# Patient Record
Sex: Male | Born: 1950
Health system: Southern US, Community
[De-identification: ages and names within clinical notes are randomized; demographics above are authoritative.]

## PROBLEM LIST (undated history)

## (undated) DIAGNOSIS — I4891 Unspecified atrial fibrillation: Secondary | ICD-10-CM

## (undated) DIAGNOSIS — I493 Ventricular premature depolarization: Secondary | ICD-10-CM

## (undated) DIAGNOSIS — E042 Nontoxic multinodular goiter: Secondary | ICD-10-CM

## (undated) DIAGNOSIS — M75100 Unspecified rotator cuff tear or rupture of unspecified shoulder, not specified as traumatic: Secondary | ICD-10-CM

## (undated) DIAGNOSIS — U071 COVID-19: Secondary | ICD-10-CM

## (undated) DIAGNOSIS — M1990 Unspecified osteoarthritis, unspecified site: Secondary | ICD-10-CM

## (undated) DIAGNOSIS — F40240 Claustrophobia: Secondary | ICD-10-CM

## (undated) DIAGNOSIS — R002 Palpitations: Secondary | ICD-10-CM

## (undated) DIAGNOSIS — I499 Cardiac arrhythmia, unspecified: Secondary | ICD-10-CM

## (undated) DIAGNOSIS — M7521 Bicipital tendinitis, right shoulder: Secondary | ICD-10-CM

## (undated) HISTORY — DX: Claustrophobia: F40.240

## (undated) HISTORY — PX: SHOULDER SURGERY: SHX246

## (undated) HISTORY — DX: Unspecified rotator cuff tear or rupture of unspecified shoulder, not specified as traumatic: M75.100

## (undated) HISTORY — DX: Nontoxic multinodular goiter: E04.2

## (undated) HISTORY — DX: Bicipital tendinitis, right shoulder: M75.21

## (undated) HISTORY — PX: TONSILLECTOMY: SUR1361

## (undated) HISTORY — PX: OTHER SURGICAL HISTORY: SHX169

## (undated) HISTORY — DX: Unspecified atrial fibrillation: I48.91

## (undated) HISTORY — DX: Ventricular premature depolarization: I49.3

## (undated) HISTORY — PX: KNEE SURGERY: SHX244

## (undated) HISTORY — DX: COVID-19: U07.1

## (undated) HISTORY — DX: Palpitations: R00.2

---

## 2002-08-28 HISTORY — PX: MOLE REMOVAL: SHX2046

## 2013-09-17 ENCOUNTER — Encounter: Payer: Self-pay | Admitting: General Surgery

## 2013-10-06 ENCOUNTER — Ambulatory Visit (INDEPENDENT_AMBULATORY_CARE_PROVIDER_SITE_OTHER): Payer: BC Managed Care – PPO | Admitting: General Surgery

## 2013-10-06 ENCOUNTER — Encounter: Payer: Self-pay | Admitting: General Surgery

## 2013-10-06 VITALS — BP 130/78 | HR 74 | Resp 12 | Ht 67.0 in | Wt 189.0 lb

## 2013-10-06 DIAGNOSIS — R229 Localized swelling, mass and lump, unspecified: Secondary | ICD-10-CM

## 2013-10-06 DIAGNOSIS — Z85820 Personal history of malignant melanoma of skin: Secondary | ICD-10-CM

## 2013-10-06 DIAGNOSIS — C439 Malignant melanoma of skin, unspecified: Secondary | ICD-10-CM

## 2013-10-06 DIAGNOSIS — D485 Neoplasm of uncertain behavior of skin: Secondary | ICD-10-CM

## 2013-10-06 NOTE — Patient Instructions (Signed)
Return one week nurse

## 2013-10-06 NOTE — Progress Notes (Signed)
Patient ID: James Carrillo, male   DOB: [birth year], 63 y.o.   MRN: [id number]  Chief Complaint  Patient presents with  . Other    mole below breast bone    HPI Mohid Furuya is a 63 y.o. male here today for an evaluation of mole above breast bone. Patient states that he noticed this 5 years. He states in the last 6 months it has gotten bigger and now is changing color. The area is not pain unless touched.  HPI  History reviewed. No pertinent past medical history.  Past Surgical History  Procedure Laterality Date  . Shoulder surgery Right 1999  . Knee surgery Left   . Mole removal  2004    Dr. Collene Schlichter    Family History  Problem Relation Age of Onset  . Melanoma Father     hand and arms    Social History History  Substance Use Topics  . Smoking status: Never Smoker   . Smokeless tobacco: Never Used  . Alcohol Use: No    No Known Allergies  No current outpatient prescriptions on file.   No current facility-administered medications for this visit.    Review of Systems Review of Systems  Constitutional: Negative.   Respiratory: Negative.   Cardiovascular: Negative.     Blood pressure 130/78, pulse 74, resp. rate 12, height 5\' 7"  (1.702 m), weight 189 lb (85.73 kg).  Physical Exam Physical Exam  Constitutional: He is oriented to person, place, and time. He appears well-developed and well-nourished.  Eyes: Conjunctivae are normal.  Neck: Neck supple.  Cardiovascular: Normal rate, regular rhythm and normal heart sounds.   Pulmonary/Chest:    6 mm raised nodular in the midline upper stereum.  Neurological: He is alert and oriented to person, place, and time.  Skin: Skin is warm and dry.       Assessment    Nodular skin lesion, possible basal cell carcinoma.     Plan    Excision was recommended and accepted. 10 cc of 0.5% Xylocaine with 0.25% Marcaine with 1-200,000 epinephrine was utilized well-tolerated. Skin preparation with ChloraPrep was applied.  The area was excised elliptically incision and a suture placed on the 12:00 position of the transversely oriented the specimen. The wound was then closed with interrupted 4-0 Vicryl subcutaneous 2 sutures. Benzoin and Steri-Strips followed by Telfa Tegaderm dressing applied. The procedure was well-tolerated. The patient will return in one week for wound examination with the staff. He will be contacted with the pathology is available.        Robert Bellow 10/06/2013, 9:04 PM

## 2013-10-10 ENCOUNTER — Telehealth: Payer: Self-pay | Admitting: General Surgery

## 2013-10-10 LAB — PATHOLOGY

## 2013-10-10 NOTE — Telephone Encounter (Signed)
Patient notified the mole removed from the chest was benign. He reports he is doing well. F/U as previously scheduled.

## 2013-10-13 ENCOUNTER — Ambulatory Visit: Payer: BC Managed Care – PPO

## 2013-10-22 ENCOUNTER — Encounter: Payer: Self-pay | Admitting: General Surgery

## 2013-11-04 ENCOUNTER — Telehealth: Payer: Self-pay | Admitting: *Deleted

## 2013-11-04 NOTE — Telephone Encounter (Signed)
t states he is healed. Area clean. Aware to call if new issues arise. He does not need to rescheduled missed nurse appointment.

## 2013-11-04 NOTE — Telephone Encounter (Signed)
No appt needed

## 2015-09-02 DIAGNOSIS — M6740 Ganglion, unspecified site: Secondary | ICD-10-CM | POA: Insufficient documentation

## 2016-11-07 ENCOUNTER — Other Ambulatory Visit: Payer: Self-pay | Admitting: Orthopedic Surgery

## 2016-11-07 DIAGNOSIS — M25511 Pain in right shoulder: Secondary | ICD-10-CM

## 2016-11-21 ENCOUNTER — Ambulatory Visit
Admission: RE | Admit: 2016-11-21 | Discharge: 2016-11-21 | Disposition: A | Discharge status: Home / Self Care | Payer: Medicare HMO | Source: Ambulatory Visit | Attending: Orthopedic Surgery | Admitting: Orthopedic Surgery

## 2016-11-21 DIAGNOSIS — M25511 Pain in right shoulder: Secondary | ICD-10-CM

## 2016-12-03 ENCOUNTER — Other Ambulatory Visit: Payer: Medicare HMO

## 2016-12-15 ENCOUNTER — Ambulatory Visit
Admission: RE | Admit: 2016-12-15 | Discharge: 2016-12-15 | Disposition: A | Discharge status: Home / Self Care | Payer: Medicare HMO | Source: Ambulatory Visit | Attending: Orthopedic Surgery | Admitting: Orthopedic Surgery

## 2017-07-31 ENCOUNTER — Ambulatory Visit: Payer: Medicare HMO | Admitting: Internal Medicine

## 2017-08-10 ENCOUNTER — Encounter: Payer: Self-pay | Admitting: Internal Medicine

## 2017-08-10 ENCOUNTER — Ambulatory Visit (INDEPENDENT_AMBULATORY_CARE_PROVIDER_SITE_OTHER): Payer: Medicare HMO | Admitting: Internal Medicine

## 2017-08-10 VITALS — BP 108/76 | HR 79 | Temp 98.0°F | Ht 68.0 in | Wt 185.4 lb

## 2017-08-10 DIAGNOSIS — E785 Hyperlipidemia, unspecified: Secondary | ICD-10-CM | POA: Diagnosis not present

## 2017-08-10 DIAGNOSIS — L049 Acute lymphadenitis, unspecified: Secondary | ICD-10-CM

## 2017-08-10 DIAGNOSIS — Z23 Encounter for immunization: Secondary | ICD-10-CM

## 2017-08-10 DIAGNOSIS — F419 Anxiety disorder, unspecified: Secondary | ICD-10-CM

## 2017-08-10 DIAGNOSIS — R591 Generalized enlarged lymph nodes: Secondary | ICD-10-CM

## 2017-08-10 MED ORDER — ALPRAZOLAM 0.5 MG PO TABS: 0.5000 mg | ORAL_TABLET | ORAL | 0 refills | Status: DC | PRN

## 2017-08-10 MED ORDER — DTAP-IPV VACCINE IM SUSP: 0.5000 mL | Freq: Once | INTRAMUSCULAR | 0 refills | Status: AC

## 2017-08-10 MED ORDER — ZOSTER VAC RECOMB ADJUVANTED 50 MCG/0.5ML IM SUSR: 0.5000 mL | Freq: Once | INTRAMUSCULAR | 0 refills | Status: AC

## 2017-08-10 NOTE — Patient Instructions (Signed)
I will let you know about he CT scan results  [place] f/u in 4-6 months sooner if needed.   Lymphadenopathy Lymphadenopathy refers to swollen or enlarged lymph glands, also called lymph nodes. Lymph glands are part of your body's defense (immune) system, which protects the body from infections, germs, and diseases. Lymph glands are found in many locations in your body, including the neck, underarm, and groin. Many things can cause lymph glands to become enlarged. When your immune system responds to germs, such as viruses or bacteria, infection-fighting cells and fluid build up. This causes the glands to grow in size. Usually, this is not something to worry about. The swelling and any soreness often go away without treatment. However, swollen lymph glands can also be caused by a number of diseases. Your health care provider may do various tests to help determine the cause. If the cause of your swollen lymph glands cannot be found, it is important to monitor your condition to make sure the swelling goes away. Follow these instructions at home: Watch your condition for any changes. The following actions may help to lessen any discomfort you are feeling:  Get plenty of rest.  Take medicines only as directed by your health care provider. Your health care provider may recommend over-the-counter medicines for pain.  Apply moist heat compresses to the site of swollen lymph nodes as directed by your health care provider. This can help reduce any pain.  Check your lymph nodes daily for any changes.  Keep all follow-up visits as directed by your health care provider. This is important.  Contact a health care provider if:  Your lymph nodes are still swollen after 2 weeks.  Your swelling increases or spreads to other areas.  Your lymph nodes are hard, seem fixed to the skin, or are growing rapidly.  Your skin over the lymph nodes is red and inflamed.  You have a fever.  You have  chills.  You have fatigue.  You develop a sore throat.  You have abdominal pain.  You have weight loss.  You have night sweats. Get help right away if:  You notice fluid leaking from the area of the enlarged lymph node.  You have severe pain in any area of your body.  You have chest pain.  You have shortness of breath. This information is not intended to replace advice given to you by your health care provider. Make sure you discuss any questions you have with your health care provider. Document Released: 05/23/2008 Document Revised: 01/20/2016 Document Reviewed: 03/19/2014 Elsevier Interactive Patient Education  [employer].

## 2017-08-10 NOTE — Progress Notes (Addendum)
Chief Complaint  Patient presents with  . Establish Care   Dr. Kelly Services patient from [facility]  1. He is c/w enlarged lymph nodes in his neck and it was eval 2 years ago via [place] at [facility] He had a dream that was concerning to him and wants to get checked out. He is feeling well overall and still active with exercise. He does get claustrophobic with CT/MRI scans and requests Xanax today  2. HLD labs checked w/in last 6 months will get from [facility] declines to be on statin    Review of Systems  Constitutional: Negative for weight loss.  HENT: Negative for hearing loss.   Respiratory: Negative for shortness of breath.   Cardiovascular: Negative for chest pain.  Gastrointestinal: Negative for abdominal pain and blood in stool.  Genitourinary:       H/o urine retention since 20s r/o BPH per pt   Musculoskeletal: Positive for joint pain.       +right shoulder pain intermittently   Skin: Negative for rash.  Neurological: Negative for headaches.  Endo/Heme/Allergies:       C/w lymph nodes neck   Psychiatric/Behavioral: Negative for memory loss.   Past Medical History:  Diagnosis Date  . Claustrophobia    Past Surgical History:  Procedure Laterality Date  . KNEE SURGERY Left   . MOLE REMOVAL  2004   Dr. Collene Schlichter  . SHOULDER SURGERY Right 1999   Family History  Problem Relation Age of Onset  . Melanoma Father        hand and arms   Social History   Socioeconomic History  . Marital status: Single    Spouse name: Not on file  . Number of children: Not on file  . Years of education: Not on file  . Highest education level: Not on file  Social Needs  . Financial resource strain: Not on file  . Food insecurity - worry: Not on file  . Food insecurity - inability: Not on file  . Transportation needs - medical: Not on file  . Transportation needs - non-medical: Not on file  Occupational History  . Not on file  Tobacco Use  . Smoking status: Never Smoker  . Smokeless tobacco: Never  Used  Substance and Sexual Activity  . Alcohol use: No  . Drug use: No  . Sexual activity: Not on file  Other Topics Concern  . Not on file  Social History Narrative  . Not on file   Current Meds  Medication Sig  . aspirin EC 81 MG tablet Take 81 tablets by mouth daily.  Marland Kitchen Lysine 500 MG TABS Take 1 tablet by mouth daily.  . Multiple Vitamins-Minerals (MULTIVITAMIN ADULT PO) Take 1 tablet by mouth daily.  . Omega-3 Fatty Acids (FISH OIL) 1200 MG CAPS Take 1 capsule by mouth 2 (two) times daily.  . Red Yeast Rice 600 MG CAPS Take 1 capsule by mouth 2 (two) times daily.   No Known Allergies No results found for this or any previous visit (from the past 2160 hour(s)). Objective  Body mass index is 28.19 kg/m. Wt Readings from Last 3 Encounters:  08/10/17 185 lb 6 oz (84.1 kg)  10/06/13 189 lb (85.7 kg)   Temp Readings from Last 3 Encounters:  08/10/17 98 F (36.7 C) (Oral)   BP Readings from Last 3 Encounters:  08/10/17 108/76  10/06/13 130/78   Pulse Readings from Last 3 Encounters:  08/10/17 79  10/06/13 74   O2 room air 94%  Physical Exam  Constitutional: He is oriented to person, place, and time and well-developed, well-nourished, and in no distress. Vital signs are normal.  HENT:  Head: Normocephalic and atraumatic.    Mouth/Throat: Oropharynx is clear and moist and mucous membranes are normal.  Eyes: Conjunctivae are normal. Pupils are equal, round, and reactive to light.  Cardiovascular: Normal rate, regular rhythm and normal heart sounds.  Pulmonary/Chest: Effort normal and breath sounds normal.  Abdominal: Soft. Bowel sounds are normal. There is no tenderness.  Lymphadenopathy:       Head (right side): No submental, no submandibular, no tonsillar, no preauricular, no posterior auricular and no occipital adenopathy present.       Head (left side): Submandibular adenopathy present. No submental, no tonsillar, no preauricular, no posterior auricular and no  occipital adenopathy present.    He has cervical adenopathy.       Right: No supraclavicular adenopathy present.       Left: No supraclavicular adenopathy present.  Neurological: He is alert and oriented to person, place, and time. He has normal motor skills. Gait normal. Gait normal.  Skin: Skin is warm, dry and intact.  Psychiatric: Mood, memory, affect and judgment normal.  Nursing note and vitals reviewed.   Assessment   1. Lymphadenopathy left submandibular with h/o lymphadenopathy and h/o thyroid cyst thyroid US done 07/09/15 possibly at [facility]  2. HLD pt declines statin  Lipid 04/2016 TC 198, TG 185, HDL 40, LDL 121  Lipid 11/10/16 TC 201, TG 153, HDL 43, LDL 126  3. HM  Plan  1. Do CT neck with contrast Xanax 0.5 prn #3 no RF for procedure Obtain US neck [facility] 2 years ago  2. On red yeast rice and fish oil Lifestyle changes, healthy  Declines statin in the past  3. Had flu shot 2018 maybe [facility]  --call to confirm vaccines  -check on pneumonia vaccines as well  -prevnar had 06/04/15, pna 23 had 06/02/13 (had at [place] ct drug) ,shingles had 05/25/14  cologuard neg 08/06/15 done by Dr. Kelly Services at [place] declined coloscopy in the past.   Tdap, shingrix, hep B vaccine given Rx today to get at pharmacy  Declines colonoscopy did cologuard previously   Never smoker  Need to obtain [facility] records. Obtained 08/15/17 reviewed  Hep C neg 04/2016  PSA 11/16/16 1.1  TSH 03/14/17 1.340 rec hep B vaccine 03/14/17 titer low <10. sAg and core total neg  Follows with [place] derm referred 10/2016  At one pt on Valtrex h/o HSV but has not needed recently   [facility] [place].   Provider: Dr. Olivia Mackie McLean-Scocuzza-Internal Medicine

## 2017-09-06 ENCOUNTER — Ambulatory Visit: Payer: Medicare HMO

## 2017-09-07 ENCOUNTER — Telehealth: Payer: Self-pay

## 2017-09-07 NOTE — Telephone Encounter (Signed)
Copied from [place]. Topic: Inquiry >> Sep 07, 2017 11:31 AM [name] wrote: Reason for CRM: pt requests instead of the ct scan that was ordered pt wants an ultrasound/mri, contact pt to advise

## 2017-09-18 DIAGNOSIS — S46019A Strain of muscle(s) and tendon(s) of the rotator cuff of unspecified shoulder, initial encounter: Secondary | ICD-10-CM | POA: Insufficient documentation

## 2017-10-12 ENCOUNTER — Other Ambulatory Visit: Payer: Self-pay | Admitting: Internal Medicine

## 2017-10-12 DIAGNOSIS — R591 Generalized enlarged lymph nodes: Secondary | ICD-10-CM

## 2017-10-12 DIAGNOSIS — E041 Nontoxic single thyroid nodule: Secondary | ICD-10-CM

## 2017-10-12 NOTE — Telephone Encounter (Signed)
Ask ultrasound scheduling if needs 2 separate orders to look at thyroid and neck for lymph nodes please  Please call pt to schedule US neck  And inform pt      Thanks [place]

## 2017-10-12 NOTE — Telephone Encounter (Signed)
Patient calling back stating that he has sent a message to cancell CT but wanted a U/S instead he had called and left a message on 09-07-17 and still haven't heard from anyone

## 2017-10-12 NOTE — Telephone Encounter (Signed)
Please advise 

## 2017-10-19 ENCOUNTER — Ambulatory Visit
Admission: RE | Admit: 2017-10-19 | Discharge: 2017-10-19 | Disposition: A | Discharge status: Home / Self Care | Payer: Medicare HMO | Source: Ambulatory Visit | Attending: Internal Medicine | Admitting: Internal Medicine

## 2017-10-19 DIAGNOSIS — R591 Generalized enlarged lymph nodes: Secondary | ICD-10-CM | POA: Diagnosis not present

## 2017-10-19 DIAGNOSIS — E041 Nontoxic single thyroid nodule: Secondary | ICD-10-CM | POA: Diagnosis present

## 2017-10-19 DIAGNOSIS — E042 Nontoxic multinodular goiter: Secondary | ICD-10-CM | POA: Insufficient documentation

## 2017-10-31 ENCOUNTER — Encounter: Payer: Self-pay | Admitting: Internal Medicine

## 2018-01-18 ENCOUNTER — Ambulatory Visit: Payer: Medicare HMO

## 2018-02-05 ENCOUNTER — Ambulatory Visit: Payer: Self-pay | Admitting: Sports Medicine

## 2018-02-08 ENCOUNTER — Ambulatory Visit (INDEPENDENT_AMBULATORY_CARE_PROVIDER_SITE_OTHER): Payer: Medicare HMO | Admitting: Internal Medicine

## 2018-02-08 ENCOUNTER — Encounter: Payer: Self-pay | Admitting: Internal Medicine

## 2018-02-08 VITALS — BP 128/90 | HR 66 | Temp 97.5°F | Ht 67.5 in | Wt 179.2 lb

## 2018-02-08 DIAGNOSIS — Z1389 Encounter for screening for other disorder: Secondary | ICD-10-CM

## 2018-02-08 DIAGNOSIS — E041 Nontoxic single thyroid nodule: Secondary | ICD-10-CM | POA: Diagnosis not present

## 2018-02-08 DIAGNOSIS — Z1329 Encounter for screening for other suspected endocrine disorder: Secondary | ICD-10-CM

## 2018-02-08 DIAGNOSIS — R269 Unspecified abnormalities of gait and mobility: Secondary | ICD-10-CM

## 2018-02-08 DIAGNOSIS — E785 Hyperlipidemia, unspecified: Secondary | ICD-10-CM | POA: Diagnosis not present

## 2018-02-08 DIAGNOSIS — R002 Palpitations: Secondary | ICD-10-CM | POA: Diagnosis not present

## 2018-02-08 DIAGNOSIS — M25512 Pain in left shoulder: Secondary | ICD-10-CM

## 2018-02-08 DIAGNOSIS — Z13220 Encounter for screening for lipoid disorders: Secondary | ICD-10-CM | POA: Diagnosis not present

## 2018-02-08 DIAGNOSIS — G8929 Other chronic pain: Secondary | ICD-10-CM | POA: Diagnosis not present

## 2018-02-08 DIAGNOSIS — Z125 Encounter for screening for malignant neoplasm of prostate: Secondary | ICD-10-CM | POA: Diagnosis not present

## 2018-02-08 DIAGNOSIS — E559 Vitamin D deficiency, unspecified: Secondary | ICD-10-CM | POA: Diagnosis not present

## 2018-02-08 DIAGNOSIS — Z0000 Encounter for general adult medical examination without abnormal findings: Secondary | ICD-10-CM | POA: Diagnosis not present

## 2018-02-08 LAB — LIPID PANEL
CHOLESTEROL: 220 mg/dL — AB (ref 0–200)
HDL: 42 mg/dL (ref >39.00)
NonHDL: 178.23
Total CHOL/HDL Ratio: 5
Triglycerides: 218 mg/dL — ABNORMAL HIGH (ref 0.0–149.0)
VLDL: 43.6 mg/dL — AB (ref 0.0–40.0)

## 2018-02-08 LAB — COMPREHENSIVE METABOLIC PANEL
ALK PHOS: 64 U/L (ref 39–117)
ALT: 26 U/L (ref 0–53)
AST: 23 U/L (ref 0–37)
Albumin: 4.7 g/dL (ref 3.5–5.2)
BILIRUBIN TOTAL: 0.5 mg/dL (ref 0.2–1.2)
BUN: 17 mg/dL (ref 6–23)
CO2: 26 meq/L (ref 19–32)
Calcium: 9.6 mg/dL (ref 8.4–10.5)
Chloride: 102 mEq/L (ref 96–112)
Creatinine, Ser: 0.94 mg/dL (ref 0.40–1.50)
GFR: 84.55 mL/min (ref >60.00)
GLUCOSE: 98 mg/dL (ref 70–99)
Potassium: 4.4 mEq/L (ref 3.5–5.1)
SODIUM: 139 meq/L (ref 135–145)
Total Protein: 7 g/dL (ref 6.0–8.3)

## 2018-02-08 LAB — TSH: TSH: 1.66 u[IU]/mL (ref 0.35–4.50)

## 2018-02-08 LAB — CBC WITH DIFFERENTIAL/PLATELET
Basophils Absolute: 0 10*3/uL (ref 0.0–0.1)
Basophils Relative: 0.7 % (ref 0.0–3.0)
Eosinophils Absolute: 0.2 10*3/uL (ref 0.0–0.7)
Eosinophils Relative: 2.4 % (ref 0.0–5.0)
HCT: 49.3 % (ref 39.0–52.0)
Hemoglobin: 17.1 g/dL — ABNORMAL HIGH (ref 13.0–17.0)
LYMPHS ABS: 2.2 10*3/uL (ref 0.7–4.0)
Lymphocytes Relative: 35.3 % (ref 12.0–46.0)
MCHC: 34.7 g/dL (ref 30.0–36.0)
MCV: 91.9 fl (ref 78.0–100.0)
MONO ABS: 0.8 10*3/uL (ref 0.1–1.0)
MONOS PCT: 12 % (ref 3.0–12.0)
NEUTROS ABS: 3.2 10*3/uL (ref 1.4–7.7)
NEUTROS PCT: 49.6 % (ref 43.0–77.0)
PLATELETS: 194 10*3/uL (ref 150.0–400.0)
RBC: 5.36 Mil/uL (ref 4.22–5.81)
RDW: 13.3 % (ref 11.5–15.5)
WBC: 6.4 10*3/uL (ref 4.0–10.5)

## 2018-02-08 LAB — PSA: PSA: 1.24 ng/mL (ref 0.10–4.00)

## 2018-02-08 LAB — LDL CHOLESTEROL, DIRECT: LDL DIRECT: 144 mg/dL

## 2018-02-08 LAB — VITAMIN D 25 HYDROXY (VIT D DEFICIENCY, FRACTURES): VITD: 42.25 ng/mL (ref 30.00–100.00)

## 2018-02-08 NOTE — Progress Notes (Signed)
All imms have been added to chart that was in [place].

## 2018-02-08 NOTE — Progress Notes (Signed)
Chief Complaint  Patient presents with  . Follow-up   F/u  1. Fall 08/2017 hit his head after not ducking down low enough and fell onto left shoulder and 5-6/10 pain with activity given steroid shot with Dr. Earnestine Leys and may need MRI in future with Xanax so informing me today he will let me know  2. Blood pressure controlled  3. HLD check labs today he is fasting  4. H/o thyroid nodules noted [place] no need for bx they have been stable consider f/u US thyroid   Review of Systems  Constitutional: Negative for weight loss.  HENT: Negative for hearing loss.   Eyes: Negative for blurred vision.  Respiratory: Negative for shortness of breath.   Cardiovascular: Positive for palpitations.  Gastrointestinal: Negative for blood in stool.  Musculoskeletal: Positive for falls and joint pain.  Skin: Negative for rash.  Neurological: Negative for headaches.  Psychiatric/Behavioral: Negative for depression.   Past Medical History:  Diagnosis Date  . Claustrophobia   . Multiple thyroid nodules   . Rotator cuff tear    s/p surgery in 2000 now has tear per MRI ortho [facility] Dr. Edd Fabian last steroid shot (right)   Past Surgical History:  Procedure Laterality Date  . KNEE SURGERY Left   . MOLE REMOVAL  2004   Dr. Collene Schlichter  . SHOULDER SURGERY Right 1999/2000  . TONSILLECTOMY     1956   Family History  Problem Relation Age of Onset  . Melanoma Father        hand and arms  . Arthritis Father   . Cancer Father        throat dx age 82   . Heart disease Father        MI age 53 and CABG hx   . Arthritis Mother   . Renal Disease Paternal Grandmother        [place]    Social History   Socioeconomic History  . Marital status: Single    Spouse name: Not on file  . Number of children: Not on file  . Years of education: Not on file  . Highest education level: Not on file  Occupational History  . Not on file  Social Needs  . Financial resource strain: Not on file  . Food insecurity:    Worry: Not on file    Inability: Not on file  . Transportation needs:    Medical: Not on file    Non-medical: Not on file  Tobacco Use  . Smoking status: Never Smoker  . Smokeless tobacco: Never Used  Substance and Sexual Activity  . Alcohol use: No  . Drug use: No  . Sexual activity: Yes    Comment: women   Lifestyle  . Physical activity:    Days per week: Not on file    Minutes per session: Not on file  . Stress: Not on file  Relationships  . Social connections:    Talks on phone: Not on file    Gets together: Not on file    Attends religious service: Not on file    Active member of club or organization: Not on file    Attends meetings of clubs or organizations: Not on file    Relationship status: Not on file  . Intimate partner violence:    Fear of current or ex partner: Not on file    Emotionally abused: Not on file    Physically abused: Not on file    Forced sexual activity: Not  on file  Other Topics Concern  . Not on file  Social History Narrative   Masters, professor    Current Meds  Medication Sig  . aspirin EC 81 MG tablet Take 81 tablets by mouth daily.  Marland Kitchen Lysine 500 MG TABS Take 1 tablet by mouth daily.  . Multiple Vitamins-Minerals (MULTIVITAMIN ADULT PO) Take 1 tablet by mouth daily.  . NON FORMULARY Hemp Extract 500 mg 2x per day  . Omega-3 Fatty Acids (FISH OIL) 1200 MG CAPS Take 1 capsule by mouth 2 (two) times daily.  . Red Yeast Rice 600 MG CAPS Take 1 capsule by mouth 2 (two) times daily.  . saw palmetto 160 MG capsule Take 160 mg by mouth 2 (two) times daily.  . [DISCONTINUED] ALPRAZolam (XANAX) 0.5 MG tablet Take 1 tablet (0.5 mg total) by mouth as needed for anxiety. Take 15-30 minutes before CT scan as needed, take right before CT scan as needed and 1 additional pill as needed   No Known Allergies No results found for this or any previous visit (from the past 2160 hour(s)). Objective  Body mass index is 27.65 kg/m. Wt Readings from Last 3  Encounters:  02/08/18 179 lb 3.2 oz (81.3 kg)  08/10/17 185 lb 6 oz (84.1 kg)  10/06/13 189 lb (85.7 kg)   Temp Readings from Last 3 Encounters:  02/08/18 (!) 97.5 F (36.4 C) (Oral)  08/10/17 98 F (36.7 C) (Oral)   BP Readings from Last 3 Encounters:  02/08/18 128/90  08/10/17 108/76  10/06/13 130/78   Pulse Readings from Last 3 Encounters:  02/08/18 66  08/10/17 79  10/06/13 74    Physical Exam  Constitutional: He is oriented to person, place, and time. Vital signs are normal. He appears well-developed and well-nourished. He is cooperative.  HENT:  Head: Normocephalic and atraumatic.  Mouth/Throat: Oropharynx is clear and moist and mucous membranes are normal.  Eyes: Pupils are equal, round, and reactive to light. Conjunctivae are normal.  Cardiovascular: Normal rate, regular rhythm and normal heart sounds.  Pulmonary/Chest: Effort normal and breath sounds normal.  Neurological: He is alert and oriented to person, place, and time. Gait normal.  Skin: Skin is warm, dry and intact.  Psychiatric: He has a normal mood and affect. His speech is normal and behavior is normal. Judgment and thought content normal. Cognition and memory are normal.  Nursing note and vitals reviewed.   Assessment   1. H/o fall 08/2017 mechanical and left shoulder pain chronic now with h/o rotator cuff surgery on right shoulder  2. HLD  3. Palpitations per pt h/o Afib  4. Thyroid nodules  5. HM Plan  1. Consider MRI L shoulder in future with Xanax he will call back  F/u ortho had 1 steroid injection  2. Check fasting labs today  3. Consider cards referral pt declines for now  Chadsvasc score low-mod risk score 1 he is on Aspirin 81 mg qd  4. Consider thyroid US in 1-2 years  5.  Labs today  Had flu shot 2018 maybe [facility]  --call to confirm vaccines [facility] -prevnar had 06/04/15, pna 23 had 06/02/13 (had at [place] ct drug) ,shingles had 05/25/14 (zostervax) Tdap, shingrix, hep B  vaccine given Rx prev  cologuard neg 08/06/15 done by Dr. Gayland Curry at [facility] declined coloscopy in the past.  Declines colonoscopy did cologuard previously due to order at f/u   Never smoker  Need to obtain [facility] records. Obtained 08/15/17 reviewed  Hep C  neg 04/2016  PSA 11/16/16 1.1  TSH 03/14/17 1.340 rec hep B vaccine 03/14/17 titer low <10. sAg and core total neg  Follows with [facility] derm referred 10/2016 and saw   At one pt on Valtrex h/o HSV but has not needed recently on lysine and helping    Provider: Dr. Olivia Mackie McLean-Scocuzza-Internal Medicine

## 2018-02-08 NOTE — Patient Instructions (Signed)
Follow up in 6 months   Cholesterol Cholesterol is a white, waxy, fat-like substance that is needed by the human body in small amounts. The liver makes all the cholesterol we need. Cholesterol is carried from the liver by the blood through the blood vessels. Deposits of cholesterol (plaques) may build up on blood vessel (artery) walls. Plaques make the arteries narrower and stiffer. Cholesterol plaques increase the risk for heart attack and stroke. You cannot feel your cholesterol level even if it is very high. The only way to know that it is high is to have a blood test. Once you know your cholesterol levels, you should keep a record of the test results. Work with your health care provider to keep your levels in the desired range. What do the results mean?  Total cholesterol is a rough measure of all the cholesterol in your blood.  LDL (low-density lipoprotein) is the "bad" cholesterol. This is the type that causes plaque to build up on the artery walls. You want this level to be low.  HDL (high-density lipoprotein) is the "good" cholesterol because it cleans the arteries and carries the LDL away. You want this level to be high.  Triglycerides are fat that the body can either burn for energy or store. High levels are closely linked to heart disease. What are the desired levels of cholesterol?  Total cholesterol below 200.  LDL below 100 for people who are at risk, below 70 for people at very high risk.  HDL above 40 is good. A level of 60 or higher is considered to be protective against heart disease.  Triglycerides below 150. How can I lower my cholesterol? Diet Follow your diet program as told by your health care provider.  Choose fish or white meat chicken and [place], roasted or baked. Limit fatty cuts of red meat, fried foods, and processed meats, such as sausage and lunch meats.  Eat lots of fresh fruits and vegetables.  Choose whole grains, beans, pasta, potatoes, and  cereals.  Choose olive oil, corn oil, or canola oil, and use only small amounts.  Avoid butter, mayonnaise, shortening, or palm kernel oils.  Avoid foods with trans fats.  Drink skim or nonfat milk and eat low-fat or nonfat yogurt and cheeses. Avoid whole milk, cream, ice cream, egg yolks, and full-fat cheeses.  Healthier desserts include angel food cake, ginger snaps, animal crackers, hard candy, popsicles, and low-fat or nonfat frozen yogurt. Avoid pastries, cakes, pies, and cookies.  Exercise  Follow your exercise program as told by your health care provider. A regular program: ? Helps to decrease LDL and raise HDL. ? Helps with weight control.  Do things that increase your activity level, such as gardening, walking, and taking the stairs.  Ask your health care provider about ways that you can be more active in your daily life.  Medicine  Take over-the-counter and prescription medicines only as told by your health care provider. ? Medicine may be prescribed by your health care provider to help lower cholesterol and decrease the risk for heart disease. This is usually done if diet and exercise have failed to bring down cholesterol levels. ? If you have several risk factors, you may need medicine even if your levels are normal.  This information is not intended to replace advice given to you by your health care provider. Make sure you discuss any questions you have with your health care provider. Document Released: 05/09/2001 Document Revised: 03/11/2016 Document Reviewed: 02/12/2016 Elsevier Interactive Patient  Education  2018 [employer]  

## 2018-02-09 LAB — URINALYSIS, ROUTINE W REFLEX MICROSCOPIC
Bilirubin, UA: NEGATIVE
Glucose, UA: NEGATIVE
KETONES UA: NEGATIVE
LEUKOCYTES UA: NEGATIVE
NITRITE UA: NEGATIVE
PH UA: 5.5 (ref 5.0–7.5)
Protein, UA: NEGATIVE
RBC, UA: NEGATIVE
SPEC GRAV UA: 1.022 (ref 1.005–1.030)
Urobilinogen, Ur: 0.2 mg/dL (ref 0.2–1.0)

## 2018-02-13 ENCOUNTER — Telehealth: Payer: Self-pay | Admitting: Internal Medicine

## 2018-02-13 NOTE — Telephone Encounter (Signed)
Copied from [place] [id number]. Topic: Quick Communication - Lab Results >> Feb 13, 2018  2:08 PM Babs Bertin, CMA wrote: Called patient to inform them of [place] lab results. When patient returns call, triage nurse may disclose results.  Please call back  [phone]

## 2018-02-14 NOTE — Telephone Encounter (Signed)
Left message on voice mail to call back regarding labs

## 2018-03-08 ENCOUNTER — Ambulatory Visit: Payer: Self-pay | Admitting: Podiatry

## 2018-07-01 ENCOUNTER — Ambulatory Visit: Payer: Self-pay

## 2018-07-01 NOTE — Telephone Encounter (Addendum)
Pt c/o fast, skipped and extra heart beats. Pt stated he had had palpitations "for years." Pt stated that he has noted more palpitations since May 2019. Pt stated the palpitations can last for hours or just for a short time. Pt's heart rate at time of call was 52. Pt stated that he felt palpitations during call. Pt stated he has occasional dizziness when heart rate is elevated. Denies chest pain, sweating or difficulty breathing. Care advice given and pt verbalized understanding. Appointment given for 07/11/18 at 4:00 pm with Dr McLean-Scocuzza.  Reason for Disposition . Palpitations are a chronic symptom (recurrent or ongoing AND present > 4 weeks)  Answer Assessment - Initial Assessment Questions 1. DESCRIPTION: "Please describe your heart rate or heart beat that you are having" (e.g., fast/slow, regular/irregular, skipped or extra beats, "palpitations")     Fast, irregular, skipped and extra beats 2. ONSET: "When did it start?" (Minutes, hours or days)      May more frequent 3. DURATION: "How long does it last" (e.g., seconds, minutes, hours)     Can runs for hours or a short while  4. PATTERN "Does it come and go, or has it been constant since it started?"  "Does it get worse with exertion?"   "Are you feeling it now?"     Comes and goes -no- no 5. TAP: "Using your hand, can you tap out what you are feeling on a chair or table in front of you, so that I can hear?" (Note: not all patients can do this)       Not having right now 6. HEART RATE: "Can you tell me your heart rate?" "How many beats in 15 seconds?"  (Note: not all patients can do this)       52 at time of call pt felt a skipped beat or two during call 7. RECURRENT SYMPTOM: "Have you ever had this before?" If so, ask: "When was the last time?" and "What happened that time?"      yes 8. CAUSE: "What do you think is causing the palpitations?"     No idea 9. CARDIAC HISTORY: "Do you have any history of heart disease?" (e.g., heart  attack, angina, bypass surgery, angioplasty, arrhythmia)      no 10. OTHER SYMPTOMS: "Do you have any other symptoms?" (e.g., dizziness, chest pain, sweating, difficulty breathing)       Occasional dizziness when heart rate is elevated 11. PREGNANCY: "Is there any chance you are pregnant?" "When was your last menstrual period?"       n/a  Protocols used: HEART RATE AND HEARTBEAT QUESTIONS-A-AH

## 2018-07-11 ENCOUNTER — Ambulatory Visit (INDEPENDENT_AMBULATORY_CARE_PROVIDER_SITE_OTHER): Payer: Medicare HMO | Admitting: Internal Medicine

## 2018-07-11 ENCOUNTER — Encounter: Payer: Self-pay | Admitting: Internal Medicine

## 2018-07-11 VITALS — BP 130/70 | HR 48 | Temp 98.2°F | Ht 67.5 in | Wt 183.4 lb

## 2018-07-11 DIAGNOSIS — I499 Cardiac arrhythmia, unspecified: Secondary | ICD-10-CM

## 2018-07-11 DIAGNOSIS — R002 Palpitations: Secondary | ICD-10-CM | POA: Diagnosis not present

## 2018-07-11 DIAGNOSIS — R001 Bradycardia, unspecified: Secondary | ICD-10-CM

## 2018-07-11 DIAGNOSIS — I493 Ventricular premature depolarization: Secondary | ICD-10-CM

## 2018-07-11 DIAGNOSIS — R5383 Other fatigue: Secondary | ICD-10-CM

## 2018-07-11 NOTE — Patient Instructions (Addendum)
Holter monitor  Cardiology asap  Possibly consider echo and sleep study in the future   Bradycardia, Adult Bradycardia is a slower-than-normal heartbeat. A normal resting heart rate for an adult ranges from 60 to 100 beats per minute. With bradycardia, the resting heart rate is less than 60 beats per minute. Bradycardia can prevent enough oxygen from reaching certain areas of your body when you are active. It can be serious if it keeps enough oxygen from reaching your brain and other parts of your body. Bradycardia is not a problem for everyone. For some healthy adults, a slow resting heart rate is normal. What are the causes? This condition may be caused by:  A problem with the heart, including: ? A problem with the heart's electrical system, such as a heart block. ? A problem with the heart's natural pacemaker (sinus node). ? Heart disease. ? A heart attack. ? Heart damage. ? A heart infection. ? A heart condition that is present at birth (congenital heart defect).  Certain medicines that treat heart conditions.  Certain conditions, such as hypothyroidism and obstructive sleep apnea.  Problems with the balance of chemicals and other substances, like potassium, in the blood.  What increases the risk? This condition is more likely to develop in adults who:  Are age 35 or older.  Have high blood pressure (hypertension), high cholesterol (hyperlipidemia), or diabetes.  Drink heavily, use tobacco or nicotine products, or use drugs.  Are stressed.  What are the signs or symptoms? Symptoms of this condition include:  Light-headedness.  Feeling faint or fainting.  Fatigue and weakness.  Shortness of breath.  Chest pain (angina).  Drowsiness.  Confusion.  Dizziness.  How is this diagnosed? This condition may be diagnosed based on:  Your symptoms.  Your medical history.  A physical exam.  During the exam, your health care provider will listen to your heartbeat  and check your pulse. To confirm the diagnosis, your health care provider may order tests, such as:  Blood tests.  An electrocardiogram (ECG). This test records the heart's electrical activity. The test can show how fast your heart is beating and whether the heartbeat is steady.  A test in which you wear a portable device (event recorder or Holter monitor) to record your heart's electrical activity while you go about your day.  Anexercise test.  How is this treated? Treatment for this condition depends on the cause of the condition and how severe your symptoms are. Treatment may involve:  Treatment of the underlying condition.  Changing your medicines or how much medicine you take.  Having a small, battery-operated device called a pacemaker implanted under the skin. When bradycardia occurs, this device can be used to increase your heart rate and help your heart to beat in a regular rhythm.  Follow these instructions at home: Lifestyle   Manage any health conditions that contribute to bradycardia as told by your health care provider.  Follow a heart-healthy diet. A nutrition specialist (dietitian) can help to educate you about healthy food options and changes.  Follow an exercise program that is approved by your health care provider.  Maintain a healthy weight.  Try to reduce or manage your stress, such as with yoga or meditation. If you need help reducing stress, ask your health care provider.  Do not use use any products that contain nicotine or tobacco, such as cigarettes and e-cigarettes. If you need help quitting, ask your health care provider.  Do not use illegal drugs.  Limit  alcohol intake to no more than 1 drink per day for nonpregnant women and 2 drinks per day for men. One drink equals 12 oz of beer, 5 oz of wine, or 1 oz of hard liquor. General instructions  Take over-the-counter and prescription medicines only as told by your health care provider.  Keep all  follow-up visits as directed by your health care provider. This is important. How is this prevented? In some cases, bradycardia may be prevented by:  Treating underlying medical problems.  Stopping behaviors or medicines that can trigger the condition.  Contact a health care provider if:  You feel light-headed or dizzy.  You almost faint.  You feel weak or are easily fatigued during physical activity.  You experience confusion or have memory problems. Get help right away if:  You faint.  You have an irregular heartbeat (palpitations).  You have chest pain.  You have trouble breathing. This information is not intended to replace advice given to you by your health care provider. Make sure you discuss any questions you have with your health care provider. Document Released: 05/06/2002 Document Revised: 04/11/2016 Document Reviewed: 02/03/2016 Elsevier Interactive Patient Education  2017 [employer]  Premature Ventricular Contraction A premature ventricular contraction (PVC) is a common irregularity in the normal heart rhythm. These contractions are extra heartbeats that start in the heart ventricles and occur too early in the normal sequence. During the PVC, the heart's normal electrical pathway is not used, so the beat is shorter and less effective. In most cases, these contractions come and go and do not require treatment. What are the causes? In many cases, the cause may not be known. Common causes of the condition include:  Smoking.  Drinking alcohol.  Caffeine.  Certain medicines.  Some illegal drugs.  Stress.  Certain medical conditions can also cause PVCs:  Changes in minerals in the blood (electrolytes).  Heart failure.  Heart valve problems.  Low blood oxygen levels or high carbon dioxide levels.  Heart attack, or coronary artery disease.  What are the signs or symptoms? The main symptom of this condition is a fast or skipped heartbeat  (palpitations). Other symptoms include:  Chest pain.  Shortness of breath.  Feeling tired.  Dizziness.  In some cases, there are no symptoms. How is this diagnosed? This condition may be diagnosed based on:  Your medical history.  A physical exam. During the exam, the health care provider will check for irregular heartbeats.  Tests, such as: ? An ECG (electrocardiogram) to monitor the electrical activity of your heart. ? Holter monitor testing. This involves wearing a device that clips to your clothing and monitors the electrical activity of your heart over longer periods of time. ? Stress tests to see how exercise affects your heart rhythm and blood supply. ? Echocardiogram. This test uses sound waves (ultrasound) to produce an image of your heart. ? Electrophysiology study. This test checks the electric pathways in your heart.  How is this treated? Treatment depends on any underlying conditions, the type of PVCs that you are having, and how much the symptoms are interfering with your daily life. Possible treatments include:  Avoiding things that can trigger the premature contractions, such as caffeine or alcohol.  Medicines. These may be given if symptoms are severe or if the extra heartbeats are frequent.  Treatment for any underlying condition that is found to be the cause of the contractions.  Catheter ablation. This procedure destroys the heart tissues that send abnormal signals.  In some cases, no treatment is required. Follow these instructions at home: Lifestyle Follow these instructions as told by your health care provider:  Do not use any products that contain nicotine or tobacco, such as cigarettes and e-cigarettes. If you need help quitting, ask your health care provider.  If caffeine triggers episodes of PVC, do not eat, drink, or use anything with caffeine in it.  If caffeine does not seem to trigger episodes, consume caffeine in moderation.  If alcohol  triggers episodes of PVC, do not drink alcohol.  If alcohol does not seem to trigger episodes, limit alcohol intake to no more than 1 drink a day for nonpregnant women and 2 drinks a day for men. One drink equals 12 oz of beer, 5 oz of wine, or 1 oz of hard liquor.  Exercise regularly. Ask your health care provider what type of exercise is safe for you.  Find healthy ways to manage stress. Avoid stressful situations when possible.  Try to get at least 7-9 hours of sleep each night, or as much as recommended by your health care provider.  Do not use illegal drugs.  General instructions  Take over-the-counter and prescription medicines only as told by your health care provider.  Keep all follow-up visits as told by your health care provider. This is important. Get help right away if:  You feel palpitations that are frequent or continual.  You have chest pain.  You have shortness of breath.  You have sweating for no reason.  You have nausea and vomiting.  You become light-headed or you faint. This information is not intended to replace advice given to you by your health care provider. Make sure you discuss any questions you have with your health care provider. Document Released: 03/31/2004 Document Revised: 04/07/2016 Document Reviewed: 01/19/2016 Elsevier Interactive Patient Education  2018 [employer].  Palpitations A palpitation is the feeling that your heartbeat is irregular or is faster than normal. It may feel like your heart is fluttering or skipping a beat. Palpitations are usually not a serious problem. They may be caused by many things, including smoking, caffeine, alcohol, stress, and certain medicines. Although most causes of palpitations are not serious, palpitations can be a sign of a serious medical problem. In some cases, you may need further medical evaluation. Follow these instructions at home: Pay attention to any changes in your symptoms. Take these actions to  help with your condition:  Avoid the following: ? Caffeinated coffee, tea, soft drinks, diet pills, and energy drinks. ? Chocolate. ? Alcohol.  Do not use any tobacco products, such as cigarettes, chewing tobacco, and e-cigarettes. If you need help quitting, ask your health care provider.  Try to reduce your stress and anxiety. Things that can help you relax include: ? Yoga. ? Meditation. ? Physical activity, such as swimming, jogging, or walking. ? Biofeedback. This is a method that helps you learn to use your mind to control things in your body, such as your heartbeats.  Get plenty of rest and sleep.  Take over-the-counter and prescription medicines only as told by your health care provider.  Keep all follow-up visits as told by your health care provider. This is important.  Contact a health care provider if:  You continue to have a fast or irregular heartbeat after 24 hours.  Your palpitations occur more often. Get help right away if:  You have chest pain or shortness of breath.  You have a severe headache.  You feel dizzy or  you faint. This information is not intended to replace advice given to you by your health care provider. Make sure you discuss any questions you have with your health care provider. Document Released: 08/11/2000 Document Revised: 01/17/2016 Document Reviewed: 04/29/2015 Elsevier Interactive Patient Education  [employer].

## 2018-07-11 NOTE — Progress Notes (Signed)
Pre visit review using our clinic review tool, if applicable. No additional management support is needed unless otherwise documented below in the visit note. 

## 2018-07-11 NOTE — Progress Notes (Signed)
Chief Complaint  Patient presents with  . Palpitations   F/u 1. C/o palpitations and abnormal heart rate in 01/2018 noted but since 06/2018 worse last 1-3 weeks increasing abnormal beat frequency and fatigue. At one point his son was trying to check pulse with his phone and unable to a times pulse unable to be palpated. Palpitations since age 67 but increased frequency, no chest pain dizziness, syncope or presyncope    Review of Systems  Constitutional: Positive for malaise/fatigue. Negative for weight loss.  HENT: Negative for hearing loss.   Eyes: Negative for blurred vision.  Respiratory: Negative for shortness of breath.   Cardiovascular: Positive for palpitations. Negative for chest pain.  Skin: Negative for rash.  Neurological: Negative for loss of consciousness.   Past Medical History:  Diagnosis Date  . Atrial fibrillation ([place])    per pt intermittent since 2003   . Claustrophobia   . Multiple thyroid nodules   . Rotator cuff tear    s/p surgery in 2000 now has tear per MRI ortho [facility] Dr. Edd Fabian last steroid shot (right)   Past Surgical History:  Procedure Laterality Date  . KNEE SURGERY Left   . MOLE REMOVAL  2004   Dr. Collene Schlichter  . SHOULDER SURGERY Right 1999/2000  . TONSILLECTOMY     1956   Family History  Problem Relation Age of Onset  . Melanoma Father        hand and arms  . Arthritis Father   . Cancer Father        throat dx age 17   . Heart disease Father        MI age 27 and CABG hx   . Arthritis Mother   . Stroke Mother        age 85  . Renal Disease Paternal Grandmother        [place]    Social History   Socioeconomic History  . Marital status: Single    Spouse name: Not on file  . Number of children: Not on file  . Years of education: Not on file  . Highest education level: Not on file  Occupational History  . Not on file  Social Needs  . Financial resource strain: Not on file  . Food insecurity:    Worry: Not on file   Inability: Not on file  . Transportation needs:    Medical: Not on file    Non-medical: Not on file  Tobacco Use  . Smoking status: Never Smoker  . Smokeless tobacco: Never Used  Substance and Sexual Activity  . Alcohol use: No  . Drug use: No  . Sexual activity: Yes    Comment: women   Lifestyle  . Physical activity:    Days per week: Not on file    Minutes per session: Not on file  . Stress: Not on file  Relationships  . Social connections:    Talks on phone: Not on file    Gets together: Not on file    Attends religious service: Not on file    Active member of club or organization: Not on file    Attends meetings of clubs or organizations: Not on file    Relationship status: Not on file  . Intimate partner violence:    Fear of current or ex partner: Not on file    Emotionally abused: Not on file    Physically abused: Not on file    Forced sexual activity: Not on file  Other  Topics Concern  . Not on file  Social History Narrative   Masters, professor    No outpatient medications have been marked as taking for the 07/11/18 encounter (Office Visit) with McLean-Scocuzza, Nino Glow, MD.   No Known Allergies No results found for this or any previous visit (from the past 2160 hour(s)). Objective  Body mass index is 28.3 kg/m. Wt Readings from Last 3 Encounters:  07/11/18 183 lb 6.4 oz (83.2 kg)  02/08/18 179 lb 3.2 oz (81.3 kg)  08/10/17 185 lb 6 oz (84.1 kg)   Temp Readings from Last 3 Encounters:  07/11/18 98.2 F (36.8 C) (Oral)  02/08/18 (!) 97.5 F (36.4 C) (Oral)  08/10/17 98 F (36.7 C) (Oral)   BP Readings from Last 3 Encounters:  07/11/18 130/70  02/08/18 128/90  08/10/17 108/76   Pulse Readings from Last 3 Encounters:  07/11/18 (!) 48  02/08/18 66  08/10/17 79    Physical Exam  Constitutional: He is oriented to person, place, and time. Vital signs are normal. He appears well-developed and well-nourished. He is cooperative.  HENT:  Head:  Normocephalic and atraumatic.  Mouth/Throat: Oropharynx is clear and moist and mucous membranes are normal.  Eyes: Pupils are equal, round, and reactive to light. Conjunctivae are normal.  Cardiovascular: Normal heart sounds. An irregular rhythm present. Bradycardia present.  Frequent PVCS  Pulmonary/Chest: Effort normal and breath sounds normal.  Neurological: He is alert and oriented to person, place, and time. Gait normal.  Skin: Skin is warm, dry and intact.  Psychiatric: He has a normal mood and affect. His speech is normal and behavior is normal. Judgment and thought content normal. Cognition and memory are normal.  Nursing note and vitals reviewed.   Assessment   1. Frequent Palpitations, PVCs and bradycardia assoc. With fatigue 2. HM Plan  1. EKG today  Refer to cards appt with Dr. Fletcher Anon 07/12/18 11:20 am for holter, consider echo Also disc consider sleep study in the future  2.  utd flu -prevnar had 06/04/15, pna 23 had 06/02/13 (had at [place] [facility]) ,shingles had 05/25/14 (zostervax) confirm with pharmacy and if not had shingrix disc at f/u  Tdap utd   hep B vaccine given Rx prev to get this   cologuard neg 08/06/15 done by Dr. Gayland Curry at [place] declined coloscopy in the past.  Never smoker Hep C neg 04/2016  PSA 02/08/18 1.24  rec hep B vaccine 03/14/17 titer low <10. sAg and core total neg  Follows with [facility] seen 10/2016  Provider: Dr. Olivia Mackie McLean-Scocuzza-Internal Medicine

## 2018-07-12 ENCOUNTER — Encounter: Payer: Self-pay | Admitting: Cardiovascular Disease

## 2018-07-12 ENCOUNTER — Ambulatory Visit: Payer: Medicare HMO | Admitting: Cardiovascular Disease

## 2018-07-12 ENCOUNTER — Ambulatory Visit (INDEPENDENT_AMBULATORY_CARE_PROVIDER_SITE_OTHER): Payer: Medicare HMO

## 2018-07-12 VITALS — BP 110/84 | HR 50 | Ht 67.5 in | Wt 182.0 lb

## 2018-07-12 DIAGNOSIS — E785 Hyperlipidemia, unspecified: Secondary | ICD-10-CM | POA: Diagnosis not present

## 2018-07-12 DIAGNOSIS — I493 Ventricular premature depolarization: Secondary | ICD-10-CM

## 2018-07-12 NOTE — Patient Instructions (Signed)
Medication Instructions:  No changes If you need a refill on your cardiac medications before your next appointment, please call your pharmacy.   Lab work: None ordered  Testing/Procedures: Your physician has requested that you have an echocardiogram. Echocardiography is a painless test that uses sound waves to create images of your heart. It provides your doctor with information about the size and shape of your heart and how well your heart's chambers and valves are working. You may receive an ultrasound enhancing agent through an IV if needed to better visualize your heart during the echo.This procedure takes approximately one hour. There are no restrictions for this procedure. This will take place at the [facility] clinic.   Your physician has recommended that you wear an 3 day Zio event monitor. Event monitors are medical devices that record the heart's electrical activity. Doctors most often [place] these monitors to diagnose arrhythmias. Arrhythmias are problems with the speed or rhythm of the heartbeat. The monitor is a small, portable device. You can wear one while you do your normal daily activities. This is usually used to diagnose what is causing palpitations/syncope (passing out).    Follow-Up: At [facility], you and your health needs are our priority.  As part of our continuing mission to provide you with exceptional heart care, we have created designated Provider Care Teams.  These Care Teams include your primary Cardiologist (physician) and Advanced Practice Providers (APPs -  Physician Assistants and Nurse Practitioners) who all work together to provide you with the care you need, when you need it. You will need a follow up appointment in 2 months. You may see Dr. Fletcher Anon or one of the following Advanced Practice Providers on your designated Care Team:   Murray Hodgkins, NP Christell Faith, PA-C . Marrianne Mood, PA-C

## 2018-07-12 NOTE — Progress Notes (Signed)
Cardiology Office Note   Date:  07/12/2018   ID:  [name], DOB 1951/08/01, MRN [id number]  PCP:  McLean-Scocuzza, Nino Glow, MD  Cardiologist:   Kathlyn Sacramento, MD   Chief Complaint  Patient presents with  . other    Palpitations/irregular heart beat. Refused EKG due to insurance. Meds reviewed verbally with pt.      History of Present Illness: James Carrillo is a 67 y.o. male who was referred by Dr. Terese Door for evaluation of palpitations and PVCs.  The patient reports being diagnosed with paroxysmal atrial fibrillation at the age of 89 by Dr. Humphrey Rolls at [facility].  He has no other previous cardiac history.  He is not diabetic and has no history of hypertension.  He does have hyperlipidemia currently not on medications.  He is not a smoker and drinks only occasional beer.  He drinks 1 cup of coffee daily with no other caffeinated products. He has no family history of premature coronary artery disease, sudden death or arrhythmia.  His father did have an MI at the age of 56 and his mother had valve replacement. Over the last few weeks, he has experienced intermittent palpitations and occasional tachycardia which is usually brief.  These episodes are associated with dizziness and mild shortness of breath with no syncope or presyncope.  He denies any chest pain.  He exercises regularly as he swims at least 4 times per week and he also goes for walking frequently.  He has no symptoms with exercise.    Past Medical History:  Diagnosis Date  . Atrial fibrillation ([place])    per pt intermittent since 2003   . Claustrophobia   . Multiple thyroid nodules   . Palpitations   . PVC's (premature ventricular contractions)   . Rotator cuff tear    s/p surgery in 2000 now has tear per MRI ortho [facility] Dr. Edd Fabian last steroid shot (right)    Past Surgical History:  Procedure Laterality Date  . KNEE SURGERY Left   . MOLE REMOVAL  2004   Dr. Collene Schlichter  . SHOULDER SURGERY  Right 1999/2000  . TONSILLECTOMY     1956     Current Outpatient Medications  Medication Sig Dispense Refill  . aspirin EC 81 MG tablet Take 81 tablets by mouth daily.    Marland Kitchen Lysine 500 MG TABS Take 1 tablet by mouth daily.    . Multiple Vitamins-Minerals (MULTIVITAMIN ADULT PO) Take 1 tablet by mouth daily.    . NON FORMULARY Hemp Extract 500 mg 2x per day    . Omega-3 Fatty Acids (FISH OIL) 1200 MG CAPS Take 1 capsule by mouth 2 (two) times daily.    . Red Yeast Rice 600 MG CAPS Take 1 capsule by mouth 2 (two) times daily.    . saw palmetto 160 MG capsule Take 160 mg by mouth 2 (two) times daily.     No current facility-administered medications for this visit.     Allergies:   Patient has no known allergies.    Social History:  The patient  reports that he has never smoked. He has never used smokeless tobacco. He reports that he does not drink alcohol or use drugs.   Family History:  The patient's family history includes Arthritis in his father and mother; Cancer in his father; Heart disease in his father and mother; Melanoma in his father; Renal Disease in his paternal grandmother; Stroke in his mother.    ROS:  Please  see the history of present illness.   Otherwise, review of systems are positive for none.   All other systems are reviewed and negative.    PHYSICAL EXAM: VS:  BP 110/84 (BP Location: Right Arm, Patient Position: Sitting, Cuff Size: Normal)   Pulse (!) 50   Ht 5' 7.5" (1.715 m)   Wt 182 lb (82.6 kg)   BMI 28.08 kg/m  , BMI Body mass index is 28.08 kg/m. GEN: Well nourished, well developed, in no acute distress  HEENT: normal  Neck: no JVD, carotid bruits, or masses Cardiac: RRR with frequent premature beats; no murmurs, rubs, or gallops,no edema  Respiratory:  clear to auscultation bilaterally, normal work of breathing GI: soft, nontender, nondistended, + BS MS: no deformity or atrophy  Skin: warm and dry, no rash Neuro:  Strength and sensation are  intact Psych: euthymic mood, full affect   EKG:  EKG is not ordered today. EKG from yesterday showed normal sinus rhythm with ventricular bigeminy.   Recent Labs: 02/08/2018: ALT 26; BUN 17; Creatinine, Ser 0.94; Hemoglobin 17.1; Platelets 194.0; Potassium 4.4; Sodium 139; TSH 1.66    Lipid Panel    Component Value Date/Time   CHOL 220 (H) 02/08/2018 0831   TRIG 218.0 (H) 02/08/2018 0831   HDL 42.00 02/08/2018 0831   CHOLHDL 5 02/08/2018 0831   VLDL 43.6 (H) 02/08/2018 0831   LDLDIRECT 144.0 02/08/2018 0831      Wt Readings from Last 3 Encounters:  07/12/18 182 lb (82.6 kg)  07/11/18 183 lb 6.4 oz (83.2 kg)  02/08/18 179 lb 3.2 oz (81.3 kg)       PAD Screen 07/12/2018  Previous PAD dx? No  Previous surgical procedure? No  Pain with walking? No  Feet/toe relief with dangling? No  Painful, non-healing ulcers? No  Extremities discolored? No      ASSESSMENT AND PLAN:  1.  Symptomatic PVCs: The patient is having frequent PVCs in the form of ventricular bigeminy.  I reviewed all his labs from June and they were unremarkable including electrolytes and thyroid function.  He does not consume excessive amount of caffeine and there has been no recent stress. We have to exclude structural heart abnormalities and thus I requested an echocardiogram. I am also going to obtain a 3-day outpatient monitor to quantify the PVCs and also look for other arrhythmias given that he does complain of short episodes of tachycardia which might be due to a different etiology. I will consider referral to EP.  2.  Hyperlipidemia: LDL in June was 144.  He is taking red yeast rice.  Coronary calcium score can be considered for risk stratification to see if more aggressive therapy is needed.    Disposition:   FU with me in 2 months  Signed,  Kathlyn Sacramento, MD  07/12/2018 11:46 AM    [place]

## 2018-07-30 ENCOUNTER — Telehealth: Payer: Self-pay | Admitting: *Deleted

## 2018-07-30 NOTE — Telephone Encounter (Signed)
Left a message for the patient to call back.  

## 2018-07-30 NOTE — Telephone Encounter (Signed)
-----   Message from Wellington Hampshire, MD sent at 07/26/2018 10:11 AM EST ----- Inform patient that his monitor showed very frequent PVCs.  Recommend consultation with Dr. Caryl Comes after the echo is done.

## 2018-07-31 NOTE — Telephone Encounter (Signed)
I attempted to call the patient. I left a detailed message of the results of the heart monitor on his voice mail (ok per DPR).  I have advised him to please keep his echo appointment and to call us back and confirm if he is ok with [place] setting him up a consultation with Dr. Caryl Comes for sometime after his echo is done.  I asked that he also call back and ask for me if he has any further questions about his monitor results.

## 2018-07-31 NOTE — Telephone Encounter (Signed)
Patient scheduled 12/19  With Caryl Comes

## 2018-07-31 NOTE — Telephone Encounter (Signed)
Patient returning call.

## 2018-08-05 ENCOUNTER — Other Ambulatory Visit: Payer: Medicare HMO

## 2018-08-07 ENCOUNTER — Ambulatory Visit (INDEPENDENT_AMBULATORY_CARE_PROVIDER_SITE_OTHER): Payer: Medicare HMO

## 2018-08-07 DIAGNOSIS — I493 Ventricular premature depolarization: Secondary | ICD-10-CM

## 2018-08-08 ENCOUNTER — Ambulatory Visit: Payer: Medicare HMO | Admitting: Internal Medicine

## 2018-08-14 ENCOUNTER — Encounter: Payer: Self-pay | Admitting: Internal Medicine

## 2018-08-14 ENCOUNTER — Ambulatory Visit (INDEPENDENT_AMBULATORY_CARE_PROVIDER_SITE_OTHER): Payer: Medicare HMO | Admitting: Internal Medicine

## 2018-08-14 VITALS — BP 118/68 | HR 42 | Temp 98.2°F | Ht 67.58 in | Wt 188.8 lb

## 2018-08-14 DIAGNOSIS — R001 Bradycardia, unspecified: Secondary | ICD-10-CM

## 2018-08-14 DIAGNOSIS — L309 Dermatitis, unspecified: Secondary | ICD-10-CM | POA: Diagnosis not present

## 2018-08-14 DIAGNOSIS — J309 Allergic rhinitis, unspecified: Secondary | ICD-10-CM | POA: Diagnosis not present

## 2018-08-14 DIAGNOSIS — I493 Ventricular premature depolarization: Secondary | ICD-10-CM

## 2018-08-14 DIAGNOSIS — R002 Palpitations: Secondary | ICD-10-CM

## 2018-08-14 DIAGNOSIS — E041 Nontoxic single thyroid nodule: Secondary | ICD-10-CM | POA: Diagnosis not present

## 2018-08-14 DIAGNOSIS — L081 Erythrasma: Secondary | ICD-10-CM | POA: Diagnosis not present

## 2018-08-14 LAB — COLOGUARD: Cologuard: NEGATIVE

## 2018-08-14 MED ORDER — MICONAZOLE NITRATE 2 % EX CREA: 1.0000 "application " | TOPICAL_CREAM | Freq: Two times a day (BID) | CUTANEOUS | 0 refills | Status: DC

## 2018-08-14 MED ORDER — HYDROCORTISONE 2.5 % EX CREA: TOPICAL_CREAM | Freq: Two times a day (BID) | CUTANEOUS | 0 refills | Status: DC

## 2018-08-14 MED ORDER — IPRATROPIUM BROMIDE 0.06 % NA SOLN: 2.0000 | Freq: Four times a day (QID) | NASAL | 12 refills | Status: DC

## 2018-08-14 NOTE — Progress Notes (Signed)
Chief Complaint  Patient presents with  . Follow-up   F/u  1. Frequent PVCs per echo f/u with EP 08/15/18 Dr. Caryl Comes denies fatigue, syncope, dizziness. He reports at times palpitations when he does have them he feels dizzy 2. C/o rash under arms since last week he rarely sweats under arms and uses Toms under arms mostly in summer not in winter, He uses Tide laundry detergent but this had not changed rash it itchy son has psoriasis so he is worried  3. He reports he had a dream about cancer and wants to be checked for leukemia. Reviewed labs 02/08/18 no indication in labs he has leukemia offered to check labs again sooner than 1 year he declines for now but will let me know  4. Thyroid nodules due to have repeat thyroid US 10/19/17  5. C/o postnasal drip at times nothing tried    Review of Systems  Constitutional: Negative for malaise/fatigue and weight loss.  HENT: Negative for hearing loss.   Eyes: Negative for blurred vision.  Respiratory: Negative for shortness of breath.   Cardiovascular: Positive for palpitations. Negative for chest pain.  Skin: Positive for rash.  Neurological: Negative for dizziness and loss of consciousness.  Psychiatric/Behavioral: Negative for depression and memory loss.   Past Medical History:  Diagnosis Date  . Atrial fibrillation ([place])    per pt intermittent since 2003   . Claustrophobia   . Multiple thyroid nodules   . Palpitations   . PVC's (premature ventricular contractions)   . Rotator cuff tear    s/p surgery in 2000 now has tear per MRI ortho [facility] Dr. Edd Fabian last steroid shot (right)   Past Surgical History:  Procedure Laterality Date  . KNEE SURGERY Left   . MOLE REMOVAL  2004   Dr. Collene Schlichter  . SHOULDER SURGERY Right 1999/2000  . TONSILLECTOMY     1956   Family History  Problem Relation Age of Onset  . Melanoma Father        hand and arms  . Arthritis Father   . Cancer Father        throat dx age 77   . Heart disease Father      MI age 15 and CABG hx   . Arthritis Mother   . Stroke Mother        age 75  . Heart disease Mother        heart valve replaced older age   . Renal Disease Paternal Grandmother        [place]    Social History   Socioeconomic History  . Marital status: Single    Spouse name: Not on file  . Number of children: Not on file  . Years of education: Not on file  . Highest education level: Not on file  Occupational History  . Not on file  Social Needs  . Financial resource strain: Not on file  . Food insecurity:    Worry: Not on file    Inability: Not on file  . Transportation needs:    Medical: Not on file    Non-medical: Not on file  Tobacco Use  . Smoking status: Never Smoker  . Smokeless tobacco: Never Used  Substance and Sexual Activity  . Alcohol use: No  . Drug use: No  . Sexual activity: Yes    Comment: women   Lifestyle  . Physical activity:    Days per week: Not on file    Minutes per session: Not on  file  . Stress: Not on file  Relationships  . Social connections:    Talks on phone: Not on file    Gets together: Not on file    Attends religious service: Not on file    Active member of club or organization: Not on file    Attends meetings of clubs or organizations: Not on file    Relationship status: Not on file  . Intimate partner violence:    Fear of current or ex partner: Not on file    Emotionally abused: Not on file    Physically abused: Not on file    Forced sexual activity: Not on file  Other Topics Concern  . Not on file  Social History Narrative   Masters, professor    Single has adult kids   Current Meds  Medication Sig  . aspirin EC 81 MG tablet Take 81 tablets by mouth daily.  Marland Kitchen Lysine 500 MG TABS Take 1 tablet by mouth daily.  . Multiple Vitamins-Minerals (MULTIVITAMIN ADULT PO) Take 1 tablet by mouth daily.  . NON FORMULARY Hemp Extract 500 mg 2x per day  . Omega-3 Fatty Acids (FISH OIL) 1200 MG CAPS Take 1 capsule by mouth 2 (two)  times daily.  . Red Yeast Rice 600 MG CAPS Take 1 capsule by mouth 2 (two) times daily.  . saw palmetto 160 MG capsule Take 160 mg by mouth 2 (two) times daily.   No Known Allergies No results found for this or any previous visit (from the past 2160 hour(s)). Objective  Body mass index is 29.06 kg/m. Wt Readings from Last 3 Encounters:  08/14/18 188 lb 12.8 oz (85.6 kg)  07/12/18 182 lb (82.6 kg)  07/11/18 183 lb 6.4 oz (83.2 kg)   Temp Readings from Last 3 Encounters:  08/14/18 98.2 F (36.8 C) (Oral)  07/11/18 98.2 F (36.8 C) (Oral)  02/08/18 (!) 97.5 F (36.4 C) (Oral)   BP Readings from Last 3 Encounters:  08/14/18 118/68  07/12/18 110/84  07/11/18 130/70   Pulse Readings from Last 3 Encounters:  08/14/18 (!) 42  07/12/18 (!) 50  07/11/18 (!) 48    Physical Exam Vitals signs and nursing note reviewed.  Constitutional:      Appearance: Normal appearance.  HENT:     Head: Normocephalic and atraumatic.     Mouth/Throat:     Mouth: Mucous membranes are moist.     Pharynx: Oropharynx is clear.  Eyes:     Conjunctiva/sclera: Conjunctivae normal.     Pupils: Pupils are equal, round, and reactive to light.  Cardiovascular:     Rate and Rhythm: Bradycardia present. Rhythm irregular.     Pulses: Normal pulses.     Heart sounds: Normal heart sounds.  Pulmonary:     Effort: Pulmonary effort is normal.     Breath sounds: Normal breath sounds.  Skin:    General: Skin is warm and dry.  Neurological:     General: No focal deficit present.     Mental Status: He is alert and oriented to person, place, and time.     Gait: Gait normal.  Psychiatric:        Attention and Perception: Attention and perception normal.        Mood and Affect: Mood and affect normal.        Speech: Speech normal.        Behavior: Behavior normal. Behavior is cooperative.        Thought Content: Thought  content normal.        Cognition and Memory: Cognition and memory normal.         Judgment: Judgment normal.     Assessment   1. Bradycardia/pvcs/h/o palpitations  2. Rash under arms b/l R>L ddx dermatitis vs fungal vs erythrasma  3. Thyroid nodules  4. HM 5. Post nasal drip  Plan   1. F/u EP 08/15/18 Dr. Caryl Comes  2. Trial of hc and miconazole call back in 2 weeks if does not work try topical clindamycin  3. US thyroid sch 10/19/18  4.  sch fasting labs in future last labs 02/08/18   utd flu -prevnar had 06/04/15, pna 23 had 06/02/13 (had at [place] [facility]) ,shingles had 05/25/14(zostervax)  -confirm with pharmacy and if not had shingrix disc at f/u  Tdap utd   hep B vaccine givenRx prevto get this   cologuard neg 08/06/15 done by Dr. Gayland Curry at [facility] declined coloscopy in the past. -cologaurd pt just re-submitted 08/14/18 mailed off pending result   Never smoker Hep C neg 04/2016  PSA 02/08/18 1.24  rec hep B vaccine 03/14/17 titer low <10. sAg and core total neg  Follows with [facility] derm seen 10/2016 5. Trial of atrovent  Provider: Dr. Olivia Mackie McLean-Scocuzza-Internal Medicine

## 2018-08-14 NOTE — Progress Notes (Signed)
Pre visit review using our clinic review tool, if applicable. No additional management support is needed unless otherwise documented below in the visit note. 

## 2018-08-14 NOTE — Patient Instructions (Signed)
Use hydrocortisone and miconazole under arms if not better in 2 weeks we will try a antibacterial solution  Happy Holidays    Atopic Dermatitis Atopic dermatitis is a skin disorder that causes inflammation of the skin. This is the most common type of eczema. Eczema is a group of skin conditions that cause the skin to be itchy, red, and swollen. This condition is generally worse during the cooler winter months and often improves during the warm summer months. Symptoms can vary from person to person. Atopic dermatitis usually starts showing signs in infancy and can last through adulthood. This condition cannot be passed from one person to another (non-contagious), but it is more common in families. Atopic dermatitis may not always be present. When it is present, it is called a flare-up. What are the causes? The exact cause of this condition is not known. Flare-ups of the condition may be triggered by:  Contact with something that you are sensitive or allergic to.  Stress.  Certain foods.  Extremely hot or cold weather.  Harsh chemicals and soaps.  Dry air.  Chlorine. What increases the risk? This condition is more likely to develop in people who have a personal history or family history of eczema, allergies, asthma, or hay fever. What are the signs or symptoms? Symptoms of this condition include:  Dry, scaly skin.  Red, itchy rash.  Itchiness, which can be severe. This may occur before the skin rash. This can make sleeping difficult.  Skin thickening and cracking that can occur over time. How is this diagnosed? This condition is diagnosed based on your symptoms, a medical history, and a physical exam. How is this treated? There is no cure for this condition, but symptoms can usually be controlled. Treatment focuses on:  Controlling the itchiness and scratching. You may be given medicines, such as antihistamines or steroid creams.  Limiting exposure to things that you are  sensitive or allergic to (allergens).  Recognizing situations that cause stress and developing a plan to manage stress. If your atopic dermatitis does not get better with medicines, or if it is all over your body (widespread), a treatment using a specific type of light (phototherapy) may be used. Follow these instructions at home: Skin care   Keep your skin well-moisturized. Doing this seals in moisture and helps to prevent dryness. ? Use unscented lotions that have petroleum in them. ? Avoid lotions that contain alcohol or water. They can dry the skin.  Keep baths or showers short (less than 5 minutes) in warm water. Do not use hot water. ? Use mild, unscented cleansers for bathing. Avoid soap and bubble bath. ? Apply a moisturizer to your skin right after a bath or shower.  Do not apply anything to your skin without checking with your health care provider. General instructions  Dress in clothes made of cotton or cotton blends. Dress lightly because heat increases itchiness.  When washing your clothes, rinse your clothes twice so all of the soap is removed.  Avoid any triggers that can cause a flare-up.  Try to manage your stress.  Keep your fingernails cut short.  Avoid scratching. Scratching makes the rash and itchiness worse. It may also result in a skin infection (impetigo) due to a break in the skin caused by scratching.  Take or apply over-the-counter and prescription medicines only as told by your health care provider.  Keep all follow-up visits as told by your health care provider. This is important.  Do not be around  people who have cold sores or fever blisters. If you get the infection, it may cause your atopic dermatitis to worsen. Contact a health care provider if:  Your itchiness interferes with sleep.  Your rash gets worse or it is not better within one week of starting treatment.  You have a fever.  You have a rash flare-up after having contact with someone  who has cold sores or fever blisters. Get help right away if:  You develop pus or soft yellow scabs in the rash area. Summary  This condition causes a red rash and itchy, dry, scaly skin.  Treatment focuses on controlling the itchiness and scratching, limiting exposure to things that you are sensitive or allergic to (allergens), recognizing situations that cause stress, and developing a plan to manage stress.  Keep your skin well-moisturized.  Keep baths or showers shorter than 5 minutes and use warm water. Do not use hot water. This information is not intended to replace advice given to you by your health care provider. Make sure you discuss any questions you have with your health care provider. Document Released: 08/11/2000 Document Revised: 09/15/2016 Document Reviewed: 09/15/2016 Elsevier Interactive Patient Education  2019 [employer].

## 2018-08-15 ENCOUNTER — Ambulatory Visit: Payer: Medicare HMO | Admitting: Internal Medicine

## 2018-08-15 ENCOUNTER — Encounter: Payer: Self-pay | Admitting: Internal Medicine

## 2018-08-15 VITALS — BP 146/78 | HR 87 | Ht 68.0 in | Wt 184.0 lb

## 2018-08-15 DIAGNOSIS — I493 Ventricular premature depolarization: Secondary | ICD-10-CM | POA: Diagnosis not present

## 2018-08-15 DIAGNOSIS — I77819 Aortic ectasia, unspecified site: Secondary | ICD-10-CM | POA: Diagnosis not present

## 2018-08-15 DIAGNOSIS — Z01812 Encounter for preprocedural laboratory examination: Secondary | ICD-10-CM

## 2018-08-15 NOTE — Patient Instructions (Addendum)
Medication Instructions:  - Your physician recommends that you continue on your current medications as directed. Please refer to the Current Medication list given to you today.  If you need a refill on your cardiac medications before your next appointment, please call your pharmacy.   Lab work: - Your physician recommends that you have lab work today: BMP  If you have labs (blood work) drawn today and your tests are completely normal, you will receive your results only by: Marland Kitchen MyChart Message (if you have MyChart) OR . A paper copy in the mail If you have any lab test that is abnormal or we need to change your treatment, we will call you to review the results.  Testing/Procedures: - Your physician has recommended that you have a CT angiogram of the chest/aorta.Non-Cardiac CT scanning, (CAT scanning), is a noninvasive, special x-ray that produces cross-sectional images of the body using x-rays and a computer. CT scans help physicians diagnose and treat medical conditions. For some CT exams, a contrast material is used to enhance visibility in the area of the body being studied. CT scans provide greater clarity and reveal more details than regular x-ray exams  Friday 08/23/18- arrive at 10:15 am  [place] on [address]. Liquids only 4 hours prior to your scan  - Your physician has requested that you have an echocardiogram in 6 months. Echocardiography is a painless test that uses sound waves to create images of your heart. It provides your doctor with information about the size and shape of your heart and how well your heart's chambers and valves are working. This procedure takes approximately one hour. There are no restrictions for this procedure.  Follow-Up: At [facility], you and your health needs are our priority.  As part of our continuing mission to provide you with exceptional heart care, we have created designated Provider Care Teams.  These Care Teams include your  primary Cardiologist (physician) and Advanced Practice Providers (APPs -  Physician Assistants and Nurse Practitioners) who all work together to provide you with the care you need, when you need it. You will need a follow up appointment in 6 months with Dr. Caryl Comes (just after your echo is done)  Please call our office 2 months in advance to schedule this appointment.  Any Other Special Instructions Will Be Listed Below (If Applicable). - Your physician has recommended that you use the [employer] app to monitor your heart rate/ rhythm

## 2018-08-15 NOTE — Progress Notes (Signed)
ELECTROPHYSIOLOGY CONSULT NOTE  Patient ID: James Carrillo, MRN: [id number], DOB/AGE: [birth year] 67 y.o. Admit date: (Not on file) Date of Consult: 08/15/2018  Primary Physician: McLean-Scocuzza, Nino Glow, MD Primary Cardiologist: MA     James Carrillo is a 67 y.o. male who is being seen today for the evaluation of PVCs at the request of MA.    HPI James Carrillo is a 67 y.o. male  Referred for PVCs  These were identified during evaluation for palpitations.  They are for him however asymptomatic.  He has noted no impact of exercise tolerance during monitoring wearing he has been noted to have PVCs.  Evaluation included an echocardiogram as noted below.  Has hx of symptomatic tachypalpitations.  These have been noted on and off for a decade or more.  He was told, without documentation, the represented atrial fibrillation.  He had a severe episode of this this summer that lasted a few hours.  He describes his heart rate is irregular.  The patient denies chest pain, shortness of breath, nocturnal dyspnea, orthopnea or peripheral edema.  There have been no palpitations, lightheadedness or syncope.    Date PVCs  11/19 36%       DATE TEST EF   12/19 Echo   55-60 % Ao Root 18mm           Thromboembolic risk factors ( age -63) for a CHADSVASc Score of 1  Past Medical History:  Diagnosis Date  . Atrial fibrillation ([place])    per pt intermittent since 2003   . Claustrophobia   . Multiple thyroid nodules   . Palpitations   . PVC's (premature ventricular contractions)   . Rotator cuff tear    s/p surgery in 2000 now has tear per MRI ortho [facility] Dr. Edd Fabian last steroid shot (right)      Surgical History:  Past Surgical History:  Procedure Laterality Date  . KNEE SURGERY Left   . MOLE REMOVAL  2004   Dr. Collene Schlichter  . SHOULDER SURGERY Right 1999/2000  . TONSILLECTOMY     1956     Home Meds: Current Meds  Medication Sig  . aspirin EC 81 MG tablet Take 81  tablets by mouth daily.  . hydrocortisone 2.5 % cream Apply topically 2 (two) times daily. Axilla  . ipratropium (ATROVENT) 0.06 % nasal spray Place 2 sprays into both nostrils 4 (four) times daily. Prn  . Lysine 500 MG TABS Take 1 tablet by mouth daily.  . miconazole (MICATIN) 2 % cream Apply 1 application topically 2 (two) times daily. Axilla x 10-14 days prn  . Multiple Vitamins-Minerals (MULTIVITAMIN ADULT PO) Take 1 tablet by mouth daily.  . NON FORMULARY Hemp Extract 500 mg 2x per day  . Omega-3 Fatty Acids (FISH OIL) 1200 MG CAPS Take 1 capsule by mouth 2 (two) times daily.  . Red Yeast Rice 600 MG CAPS Take 1 capsule by mouth 2 (two) times daily.  . saw palmetto 160 MG capsule Take 160 mg by mouth 2 (two) times daily.    Allergies: No Known Allergies  Social History   Socioeconomic History  . Marital status: Single    Spouse name: Not on file  . Number of children: Not on file  . Years of education: Not on file  . Highest education level: Not on file  Occupational History  . Not on file  Social Needs  . Financial resource strain: Not on file  . Food insecurity:  Worry: Not on file    Inability: Not on file  . Transportation needs:    Medical: Not on file    Non-medical: Not on file  Tobacco Use  . Smoking status: Never Smoker  . Smokeless tobacco: Never Used  Substance and Sexual Activity  . Alcohol use: No  . Drug use: No  . Sexual activity: Yes    Comment: women   Lifestyle  . Physical activity:    Days per week: Not on file    Minutes per session: Not on file  . Stress: Not on file  Relationships  . Social connections:    Talks on phone: Not on file    Gets together: Not on file    Attends religious service: Not on file    Active member of club or organization: Not on file    Attends meetings of clubs or organizations: Not on file    Relationship status: Not on file  . Intimate partner violence:    Fear of current or ex partner: Not on file     Emotionally abused: Not on file    Physically abused: Not on file    Forced sexual activity: Not on file  Other Topics Concern  . Not on file  Social History Narrative   Masters, professor    Single has adult kids     Family History  Problem Relation Age of Onset  . Melanoma Father        hand and arms  . Arthritis Father   . Cancer Father        throat dx age 31   . Heart disease Father        MI age 50 and CABG hx   . Arthritis Mother   . Stroke Mother        age 52  . Heart disease Mother        heart valve replaced older age   . Renal Disease Paternal Grandmother        [place]      ROS:  Please see the history of present illness.     All other systems reviewed and negative.    Physical Exam: Blood pressure (!) 146/78, pulse 87, height 5\' 8"  (1.727 m), weight 184 lb (83.5 kg). General: Well developed, well nourished male in no acute distress. Head: Normocephalic, atraumatic, sclera non-icteric, no xanthomas, nares are without discharge. EENT: normal  Lymph Nodes:  none Neck: Negative for carotid bruits. JVD not elevated. Back:without scoliosis kyphosis  Lungs: Clear bilaterally to auscultation without wheezes, rales, or rhonchi. Breathing is unlabored. Heart: slow and regularly irregular RR with S1 S2. No  murmur . No rubs, or gallops appreciated. Abdomen: Soft, non-tender, non-distended with normoactive bowel sounds. No hepatomegaly. No rebound/guarding. No obvious abdominal masses. Msk:  Strength and tone appear normal for age. Extremities: No clubbing or cyanosis. No edema.  Distal pedal pulses are 2+ and equal bilaterally. Skin: Warm and Dry Neuro: Alert and oriented X 3. CN III-XII intact Grossly normal sensory and motor function . Psych:  Responds to questions appropriately with a normal affect.      Labs: Cardiac Enzymes No results for input(s): CKTOTAL, CKMB, TROPONINI in the last 72 hours. CBC Lab Results  Component Value Date   WBC 6.4  02/08/2018   HGB 17.1 (H) 02/08/2018   HCT 49.3 02/08/2018   MCV 91.9 02/08/2018   PLT 194.0 02/08/2018   PROTIME: No results for input(s): LABPROT, INR in the last  72 hours. Chemistry No results for input(s): NA, K, CL, CO2, BUN, CREATININE, CALCIUM, PROT, BILITOT, ALKPHOS, ALT, AST, GLUCOSE in the last 168 hours.  Invalid input(s): LABALBU Lipids Lab Results  Component Value Date   CHOL 220 (H) 02/08/2018   HDL 42.00 02/08/2018   TRIG 218.0 (H) 02/08/2018   BNP No results found for: PROBNP Thyroid Function Tests: No results for input(s): TSH, T4TOTAL, T3FREE, THYROIDAB in the last 72 hours.  Invalid input(s): FREET3 Miscellaneous No results found for: DDIMER  Radiology/Studies:  No results found.  EKG: sinus @ 87 17/08/37 PVCs LBBB infer Axis QS aVR/L    Assessment and Plan:  PVC  RVOT freq  Tachypalps ? Atrial fib  Aortic Root dilitation   Asymptomatic PVCs without evidence of left ventricular dysfunction.  At this juncture there is no indication for therapy.  Tachypalpitations may be atrial fibrillation.  Could be nonsustained VT.  We will undertake an AliveCor monitor for clarification.  Aortic root dilatation requires initial more complex imaging; will undertake CTA.  We will repeat echo in 6 months to look for evidence of PVC associated LV dysfunction as well as to reevaluate the aortic root.    Virl Axe

## 2018-08-16 ENCOUNTER — Telehealth: Payer: Self-pay | Admitting: Internal Medicine

## 2018-08-16 LAB — BASIC METABOLIC PANEL
BUN/Creatinine Ratio: 25 — ABNORMAL HIGH (ref 10–24)
BUN: 20 mg/dL (ref 8–27)
CO2: 22 mmol/L (ref 20–29)
Calcium: 9.1 mg/dL (ref 8.6–10.2)
Chloride: 104 mmol/L (ref 96–106)
Creatinine, Ser: 0.79 mg/dL (ref 0.76–1.27)
GFR, EST AFRICAN AMERICAN: 106 mL/min/{1.73_m2} (ref >59)
GFR, EST NON AFRICAN AMERICAN: 92 mL/min/{1.73_m2} (ref >59)
Glucose: 93 mg/dL (ref 65–99)
Potassium: 4.5 mmol/L (ref 3.5–5.2)
Sodium: 139 mmol/L (ref 134–144)

## 2018-08-16 NOTE — Telephone Encounter (Signed)
Copied from [place] [id number]. Topic: Quick Communication - See Telephone Encounter >> Aug 16, 2018  2:06 PM Blase Mess A wrote: CRM for notification. See Telephone encounter for: 08/16/18.  Quincy Simmonds  is calling from [facility]. The patient is coming is CT scan on 08/23/18. PA is required. Please advise [phone]

## 2018-08-16 NOTE — Telephone Encounter (Signed)
Pt needs something to help him relax before his Ct scan on 12/27. Please advise pt with any questions

## 2018-08-16 NOTE — Progress Notes (Signed)
Patient did not receive shingrix at pharmacy.

## 2018-08-19 ENCOUNTER — Other Ambulatory Visit: Payer: Self-pay | Admitting: Internal Medicine

## 2018-08-19 ENCOUNTER — Telehealth: Payer: Self-pay | Admitting: Internal Medicine

## 2018-08-19 ENCOUNTER — Encounter: Payer: Self-pay | Admitting: Internal Medicine

## 2018-08-19 DIAGNOSIS — F419 Anxiety disorder, unspecified: Secondary | ICD-10-CM

## 2018-08-19 MED ORDER — ALPRAZOLAM 0.5 MG PO TABS: 0.5000 mg | ORAL_TABLET | ORAL | 0 refills | Status: DC | PRN

## 2018-08-19 NOTE — Telephone Encounter (Signed)
Patient was informed of results.  Patient understood and no questions, comments, or concerns at this time.  

## 2018-08-19 NOTE — Telephone Encounter (Signed)
cologuard negative   TMS 

## 2018-08-19 NOTE — Telephone Encounter (Signed)
Sent xanax  [employer]

## 2018-08-20 ENCOUNTER — Encounter: Payer: Self-pay | Admitting: Internal Medicine

## 2018-08-20 NOTE — Telephone Encounter (Signed)
Fabio Pierce, Dr. Caryl Comes ordered this CT. Can you give this to who does your authorizations? Thank you, [employer]

## 2018-08-20 NOTE — Telephone Encounter (Signed)
I sent a staff message to our prior authorization department on 08/19/18 @ 8:54 am for a PA for the patient's CTA chest/ aorta.

## 2018-08-23 ENCOUNTER — Ambulatory Visit: Payer: Medicare HMO

## 2018-08-23 ENCOUNTER — Ambulatory Visit
Admission: RE | Admit: 2018-08-23 | Discharge: 2018-08-23 | Disposition: A | Discharge status: Home / Self Care | Payer: Medicare HMO | Source: Ambulatory Visit | Attending: Internal Medicine | Admitting: Internal Medicine

## 2018-08-23 ENCOUNTER — Telehealth: Payer: Self-pay | Admitting: *Deleted

## 2018-08-23 ENCOUNTER — Ambulatory Visit: Admission: RE | Admit: 2018-08-23 | Payer: Medicare HMO | Source: Ambulatory Visit

## 2018-08-23 DIAGNOSIS — I77819 Aortic ectasia, unspecified site: Secondary | ICD-10-CM | POA: Diagnosis present

## 2018-08-23 LAB — POCT I-STAT CREATININE: Creatinine, Ser: 0.9 mg/dL (ref 0.61–1.24)

## 2018-08-23 MED ORDER — IOPAMIDOL (ISOVUE-370) INJECTION 76%
75.0000 mL | Freq: Once | INTRAVENOUS | Status: AC | PRN
  Administered 2018-08-23: 75 mL via INTRAVENOUS

## 2018-08-23 NOTE — Telephone Encounter (Signed)
Copied from [place] [id number]. Topic: Referral - Status >> Aug 23, 2018  4:30 PM Berneta Levins wrote: Reason for CRM:   Antonio with [facility] calling with notification of denial of CT of Neck. Request didn't met criteria for medical review.  Needs additional information or a peer to peer to be scheduled before August 31, 2018. Antonio and/or peer review can be reached at [phone], ref#: [id number]

## 2018-08-23 NOTE — Telephone Encounter (Signed)
Inform the patient CT neck previously ordered denied  -we did do US neck in the past.   CT chest  Slightly dilated aortic root, 4.1 cm  -Recommend annual imaging followup by CTA or MRA this can be followed by cardiology   Ground glass appearance in lungs and scarring we will need to repeat CT scan of chest in 1 year as well to follow up ground glass changes in lungs   No acute cardiopulmonary disease.

## 2018-08-26 ENCOUNTER — Telehealth: Payer: Self-pay | Admitting: Internal Medicine

## 2018-08-26 NOTE — Telephone Encounter (Signed)
Copied from [place] [id number]. Topic: Quick Communication - See Telephone Encounter >> Aug 26, 2018  3:57 PM Blase Mess A wrote: CRM for notification. See Telephone encounter for: 08/26/18.  Jeannetta Nap from [facility] [id number] option 4 [id number] regarding a CT Scan Neck-

## 2018-08-27 ENCOUNTER — Encounter: Payer: Self-pay | Admitting: Internal Medicine

## 2018-08-27 ENCOUNTER — Telehealth: Payer: Self-pay | Admitting: Internal Medicine

## 2018-08-27 NOTE — Telephone Encounter (Signed)
Patient dropped off home Kardia tracings for md review.  Placed in nurse box.

## 2018-08-27 NOTE — Telephone Encounter (Signed)
Patient was informed of results.  Patient understood and no questions, comments, or concerns at this time.  

## 2018-08-27 NOTE — Telephone Encounter (Signed)
Patient has had sx of itching underarms and tea tree oil cleared up rash.

## 2018-08-29 NOTE — Telephone Encounter (Signed)
Alive Cor strips obtained- holding for Dr. Caryl Comes to review.

## 2018-09-03 NOTE — Telephone Encounter (Signed)
I left a message on the patient's voice mail (ok per DPR) of his CT scan results and Dr. Olin Pia impression of his [place] strips.

## 2018-09-03 NOTE — Telephone Encounter (Signed)
Notes recorded by Deboraha Sprang, MD on 09/03/2018 at 1:38 PM EST Please Inform Patient that aorta is abnormal; confirming mild enlargement. Will require further eval In 12 months per protocol Strips show PVCs Thanks

## 2018-09-04 ENCOUNTER — Encounter: Payer: Self-pay | Admitting: Physician Assistant

## 2018-09-12 ENCOUNTER — Ambulatory Visit: Payer: Medicare HMO | Admitting: Cardiovascular Disease

## 2018-09-16 NOTE — Progress Notes (Deleted)
Cardiology Office Note Date:  09/16/2018  Patient ID:  James Carrillo, James Carrillo [birth year], MRN [id number] PCP:  McLean-Scocuzza, Nino Glow, MD  Cardiologist:  Dr. Fletcher Anon, MD Electrophysiologist: Dr. Caryl Comes, MD  ***refresh   Chief Complaint: Follow up  History of Present Illness: James Carrillo is a 68 y.o. male with history of patient reported A. fib being diagnosed at age 9 by Dr. Humphrey Rolls at [facility] as well as hypertension who presents for follow-up of palpitations.  Patient was evaluated by Dr. Fletcher Anon in 06/2018 for evaluation of palpitations and PVCs.  He reported at that time a several week history of intermittent palpitations with occasional tachycardia that was usually brief and associated with dizziness and mild shortness of breath without syncope or presyncope.  No chest pain.  He reported regular exercise with swimming at least 4 times per week and walking frequently.  Patient denied any history of diabetes or hypertension.  He was not a smoker and drank only an occasional beer.  He reported drinking 1 cup of coffee daily with no other caffeinated products.  There was no family history of premature CAD, sudden death, or arrhythmia.  It was reported his father did have an MI at the age of 80 and his mother had a valve replacement.  EKG at his visit with Dr. Fletcher Anon in 06/2018 showed sinus rhythm with ventricular bigeminy.  Review of recent labs at that time revealed potassium at goal and normal thyroid function.  Outpatient cardiac monitoring in 06/2017 showed normal sinus rhythm with an average heart rate of 84 bpm.  Frequent PVCs were noted with a total of 120,000 beats in 3 days representing a 36% burden.  Some were in the form of ventricular bigeminy and trigeminy.  4 beat run of NSVT was noted.  Follow-up echo in 07/2018 showed an EF of 55 to 60%, mild LVH, no regional wall motion abnormalities, grade 2 diastolic dysfunction, mildly dilated aortic root, normal RV cavity size and  systolic function.  Frequent PVCs were noted during the study.  He was evaluated by electrophysiology on 08/15/2018 and was felt to have asymptomatic PVCs without evidence of left ventricular dysfunction.  No indication for therapy was recommended.  Given the patient's reported tachypalpitations and questionable history of A. fib it was recommended he undergo AliveCor monitoring via his smart phone.  He underwent CTA of the aorta given his incidental aortic root dilatation noted on echo which showed a slight dilatation of the aortic root measuring 4.1 cm at the sinuses of Valsalva.  ***   Past Medical History:  Diagnosis Date  . Atrial fibrillation ([place])    per pt intermittent since 2003   . Claustrophobia   . Multiple thyroid nodules   . Palpitations   . PVC's (premature ventricular contractions)   . Rotator cuff tear    s/p surgery in 2000 now has tear per MRI ortho [facility] Dr. Edd Fabian last steroid shot (right)    Past Surgical History:  Procedure Laterality Date  . KNEE SURGERY Left   . MOLE REMOVAL  2004   Dr. Collene Schlichter  . SHOULDER SURGERY Right 1999/2000  . TONSILLECTOMY     1956    No outpatient medications have been marked as taking for the 09/17/18 encounter (Appointment) with Rise Mu, PA-C.    Allergies:   Patient has no known allergies.   Social History:  The patient  reports that he has never smoked. He has never used smokeless tobacco. He reports that  he does not drink alcohol or use drugs.   Family History:  The patient's family history includes Arthritis in his father and mother; Cancer in his father; Heart disease in his father and mother; Melanoma in his father; Renal Disease in his paternal grandmother; Stroke in his mother.  ROS:   ROS   PHYSICAL EXAM: *** VS:  There were no vitals taken for this visit. BMI: There is no height or weight on file to calculate BMI.  Physical Exam   EKG:  Was ordered and interpreted by me today. Shows ***  Recent  Labs: 02/08/2018: ALT 26; Hemoglobin 17.1; Platelets 194.0; TSH 1.66 08/15/2018: BUN 20; Potassium 4.5; Sodium 139 08/23/2018: Creatinine, Ser 0.90  02/08/2018: Cholesterol 220; Direct LDL 144.0; HDL 42.00; Total CHOL/HDL Ratio 5; Triglycerides 218.0; VLDL 43.6   CrCl cannot be calculated (Patient's most recent lab result is older than the maximum 21 days allowed.).   Wt Readings from Last 3 Encounters:  08/15/18 184 lb (83.5 kg)  08/14/18 188 lb 12.8 oz (85.6 kg)  07/12/18 182 lb (82.6 kg)     Other studies reviewed: Additional studies/records reviewed today include: summarized above  ASSESSMENT AND PLAN:  1. ***  Disposition: F/u with Dr. Fletcher Anon or APP in ***  Current medicines are reviewed at length with the patient today.  The patient did not have any concerns regarding medicines.  Signed, Christell Faith, PA-C 09/16/2018 8:09 AM     [place] [address] [place] [address] [place], [place] [address] [phone]

## 2018-09-17 ENCOUNTER — Ambulatory Visit: Payer: Medicare HMO | Admitting: Physician Assistant

## 2018-10-07 NOTE — Progress Notes (Signed)
Cardiology Office Note Date:  10/08/2018  Patient ID:  James Carrillo, 1952, MRN [id number] PCP:  McLean-Scocuzza, Nino Glow, MD  Cardiologist:  Dr. Fletcher Anon, MD Electrophysiologist: Dr. Caryl Comes, MD    Chief Complaint: Follow-up  History of Present Illness: James Carrillo is a 68 y.o. male with history of patient reported A. fib being diagnosed at age 89 by Dr. Humphrey Rolls at [facility] as well as hypertension who presents for follow-up of palpitations.  Patient was evaluated by Dr. Fletcher Anon in 06/2018 for evaluation of palpitations and PVCs.  He reported at that time a several week history of intermittent palpitations with occasional tachycardia that was usually brief and associated with dizziness and mild shortness of breath without syncope or presyncope.  No chest pain.  He reported regular exercise with swimming at least 4 times per week and walking frequently.  He is not a smoker and drank only an occasional beer.  He reported drinking 1 cup of coffee daily with no other caffeinated products.  There was no family history of premature CAD, sudden death, or arrhythmia.  It was reported his father did have an MI at the age of 54 and his mother had a valve replacement.  EKG at his visit with Dr. Fletcher Anon in 06/2018 showed sinus rhythm with ventricular bigeminy.  Review of recent labs at that time revealed potassium at goal and normal thyroid function.  Outpatient cardiac monitoring in 06/2017 showed normal sinus rhythm with an average heart rate of 84 bpm.  Frequent PVCs were noted with a total of 128,000 beats in 3 days representing a 36% burden.  Some were in the form of ventricular bigeminy and trigeminy.  4 beat run of NSVT was noted.  Follow-up echo in 07/2018 showed an EF of 55 to 60%, mild LVH, no regional wall motion abnormalities, grade 2 diastolic dysfunction, mildly dilated aortic root, normal RV cavity size and systolic function.  Frequent PVCs were noted during the study.  He was  evaluated by electrophysiology on 08/15/2018 and was felt to have asymptomatic PVCs without evidence of left ventricular dysfunction.  No indication for therapy was recommended.  Given the patient's reported tachypalpitations and questionable history of A. fib it was recommended he undergo AliveCor monitoring via his smart phone.  He underwent CTA of the aorta given his incidental aortic root dilatation noted on echo which showed a slight dilatation of the aortic root measuring 4.1 cm at the sinuses of Valsalva.  EP has reviewed patient supplied AliveCor strips, which showed PVCs.  Patient comes in doing well from a cardiac perspective.  He continues to note intermittent tachypalpitations that sometimes will last several minutes and sometimes several hours.  He notes these palpitations feel exactly like they did when he first noticed them at age 59.  He denies any chest pain, shortness of breath, dizziness, presyncope, or syncope.  When he has sustained tachypalpitations he does note some nausea though has been without emesis.  No lower extremity swelling, abdominal distention, orthopnea, PND, or early satiety.  He has continued to use his alive core app with strips provided today showing sinus rhythm with frequent PVCs, occasionally in a pattern of ventricular bigeminy and trigeminy.  On 10/03/2018 at 3:46 PM he was noted to have a rhythm consistent with sustained VT with some underlying artifact.  This was reviewed by Dr. Caryl Comes who felt like this was true VT.  Patient was asymptomatic outside of some tachypalpitations at that time.  He is currently  asymptomatic and doing well.   Past Medical History:  Diagnosis Date  . Atrial fibrillation ([place])    per pt intermittent since 2003   . Claustrophobia   . Multiple thyroid nodules   . Palpitations   . PVC's (premature ventricular contractions)   . Rotator cuff tear    s/p surgery in 2000 now has tear per MRI ortho [facility] Dr. Edd Fabian last steroid shot  (right)    Past Surgical History:  Procedure Laterality Date  . KNEE SURGERY Left   . MOLE REMOVAL  2004   Dr. Collene Schlichter  . SHOULDER SURGERY Right 1999/2000  . TONSILLECTOMY     1956    Current Meds  Medication Sig  . Lysine 500 MG TABS Take 1 tablet by mouth daily.  . Multiple Vitamins-Minerals (MULTIVITAMIN ADULT PO) Take 1 tablet by mouth 2 (two) times daily.   . Omega-3 Fatty Acids (FISH OIL) 1200 MG CAPS Take 1 capsule by mouth 2 (two) times daily.  . Red Yeast Rice 600 MG CAPS Take 1 capsule by mouth 2 (two) times daily.  . saw palmetto 160 MG capsule Take 160 mg by mouth 2 (two) times daily.    Allergies:   Patient has no known allergies.   Social History:  The patient  reports that he has never smoked. He has never used smokeless tobacco. He reports that he does not drink alcohol or use drugs.   Family History:  The patient's family history includes Arthritis in his father and mother; Cancer in his father; Heart disease in his father and mother; Melanoma in his father; Renal Disease in his paternal grandmother; Stroke in his mother.  ROS:   Review of Systems  Constitutional: Positive for malaise/fatigue. Negative for chills, diaphoresis, fever and weight loss.  HENT: Negative for congestion.   Eyes: Negative for discharge and redness.  Respiratory: Negative for cough, hemoptysis, sputum production, shortness of breath and wheezing.   Cardiovascular: Positive for palpitations. Negative for chest pain, orthopnea, claudication, leg swelling and PND.  Gastrointestinal: Positive for nausea. Negative for abdominal pain, blood in stool, heartburn, melena and vomiting.  Genitourinary: Negative for hematuria.  Musculoskeletal: Negative for falls and myalgias.  Skin: Negative for rash.  Neurological: Positive for weakness. Negative for dizziness, tingling, tremors, sensory change, speech change, focal weakness and loss of consciousness.  Endo/Heme/Allergies: Does not bruise/bleed  easily.  Psychiatric/Behavioral: Negative for substance abuse. The patient is not nervous/anxious.   All other systems reviewed and are negative.    PHYSICAL EXAM:  VS:  BP 124/80 (BP Location: Left Arm, Patient Position: Sitting, Cuff Size: Normal)   Pulse 92   Ht 5\' 8"  (1.727 m)   Wt 187 lb 8 oz (85 kg)   BMI 28.51 kg/m  BMI: Body mass index is 28.51 kg/m.  Physical Exam  Constitutional: He is oriented to person, place, and time. He appears well-developed and well-nourished.  HENT:  Head: Normocephalic and atraumatic.  Eyes: Right eye exhibits no discharge. Left eye exhibits no discharge.  Neck: Normal range of motion. No JVD present.  Cardiovascular: Normal rate, regular rhythm, S1 normal, S2 normal and normal heart sounds. Exam reveals no distant heart sounds, no friction rub, no midsystolic click and no opening snap.  No murmur heard. Pulses:      Posterior tibial pulses are 2+ on the right side and 2+ on the left side.  Frequent ectopy noted to cardiac auscultation   Pulmonary/Chest: Effort normal and breath sounds normal. No respiratory distress.  He has no decreased breath sounds. He has no wheezes. He has no rales. He exhibits no tenderness.  Abdominal: Soft. He exhibits no distension. There is no abdominal tenderness.  Musculoskeletal:        General: No edema.  Neurological: He is alert and oriented to person, place, and time.  Skin: Skin is warm and dry. No cyanosis. Nails show no clubbing.  Psychiatric: He has a normal mood and affect. His speech is normal and behavior is normal. Judgment and thought content normal.     EKG:  Was ordered and interpreted by me today. Shows NSR, 92 bpm, frequent PVCs, no acute st/t changes. Rhythm strip showed NSR with frequent PVCs.   Patient supplied AliveCor strips show NSR with PVCs, occasionally in a pattern of ventricular bigeminy and trigeminy. On 10/03/2018 at 3:46 PM it is documented he had sustained VT with underlying artifact.   This was reviewed with EP.  Recent Labs: 02/08/2018: ALT 26; Hemoglobin 17.1; Platelets 194.0; TSH 1.66 08/15/2018: BUN Carrillo; Potassium 4.5; Sodium 139 08/23/2018: Creatinine, Ser 0.90  02/08/2018: Cholesterol 220; Direct LDL 144.0; HDL 42.00; Total CHOL/HDL Ratio 5; Triglycerides 218.0; VLDL 43.6   CrCl cannot be calculated (Patient's most recent lab result is older than the maximum 21 days allowed.).   Wt Readings from Last 3 Encounters:  02/11/Carrillo 187 lb 8 oz (85 kg)  08/15/18 184 lb (83.5 kg)  08/14/18 188 lb 12.8 oz (85.6 kg)     Other studies reviewed: Additional studies/records reviewed today include: summarized above  ASSESSMENT AND PLAN:  1. VT: This was noted on his alive core app rhythm strips provided at the office visit today.  Strips were reviewed with his electrophysiologist, Dr. Caryl Comes, who felt like this was true VT with underlying artifact.  EP has recommended we start the patient on metoprolol tartrate 25 mg twice daily.  We will schedule the patient for a Lexiscan Myoview to evaluate for high risk ischemia.  We are checking a CMP, CBC, magnesium, and TSH with recommendation to replete potassium and magnesium to goal of 4.0 and 2.0 respectively.  If he does not tolerate beta-blocker he may need to use calcium channel blocker.  Also discussed with the patient the possible need for antiarrhythmic therapy.  EP has recommended he keep his previously scheduled appointment with them which will be in the summer 2020.  2. Frequent PVCs: As above.  3. Dilated aortic root: Recent CTA as above.  Start Lopressor given mildly elevated BP and PVC burden as above.  Recommend optimal blood pressure and lipid control.  He will need follow-up CTA of the aorta in 12 months time from his original imaging.  4. HTN: Starting Lopressor as above.  Disposition: F/u with Dr. Fletcher Anon or an APP in 4 weeks. Follow up with EP as directed.   Current medicines are reviewed at length with the patient today.   The patient did not have any concerns regarding medicines.  Signed, Christell Faith, PA-C 10/08/2018 2:21 PM     [address] [place], [place] [address] [phone]

## 2018-10-08 ENCOUNTER — Encounter: Payer: Self-pay | Admitting: Physician Assistant

## 2018-10-08 ENCOUNTER — Ambulatory Visit: Payer: Medicare HMO | Admitting: Physician Assistant

## 2018-10-08 VITALS — BP 124/80 | HR 92 | Ht 68.0 in | Wt 187.5 lb

## 2018-10-08 DIAGNOSIS — I77819 Aortic ectasia, unspecified site: Secondary | ICD-10-CM

## 2018-10-08 DIAGNOSIS — I472 Ventricular tachycardia, unspecified: Secondary | ICD-10-CM

## 2018-10-08 DIAGNOSIS — I10 Essential (primary) hypertension: Secondary | ICD-10-CM | POA: Diagnosis not present

## 2018-10-08 DIAGNOSIS — R0602 Shortness of breath: Secondary | ICD-10-CM | POA: Diagnosis not present

## 2018-10-08 DIAGNOSIS — I493 Ventricular premature depolarization: Secondary | ICD-10-CM

## 2018-10-08 MED ORDER — METOPROLOL TARTRATE 25 MG PO TABS: 25.0000 mg | ORAL_TABLET | Freq: Two times a day (BID) | ORAL | 3 refills | Status: DC

## 2018-10-08 NOTE — Patient Instructions (Signed)
Medication Instructions:  Your physician has recommended you make the following change in your medication:  1- START Lopressor 1 tablet (25 mg total) twice daily   If you need a refill on your cardiac medications before your next appointment, please call your pharmacy.   Lab work: None ordered  If you have labs (blood work) drawn today and your tests are completely normal, you will receive your results only by: Marland Kitchen MyChart Message (if you have MyChart) OR . A paper copy in the mail If you have any lab test that is abnormal or we need to change your treatment, we will call you to review the results.  Testing/Procedures: 1- [place]  Your caregiver has ordered a Stress Test with nuclear imaging. The purpose of this test is to evaluate the blood supply to your heart muscle. This procedure is referred to as a "Non-Invasive Stress Test." This is because other than having an IV started in your vein, nothing is inserted or "invades" your body. Cardiac stress tests are done to find areas of poor blood flow to the heart by determining the extent of coronary artery disease (CAD). Some patients exercise on a treadmill, which naturally increases the blood flow to your heart, while others who are  unable to walk on a treadmill due to physical limitations have a pharmacologic/chemical stress agent called Lexiscan . This medicine will mimic walking on a treadmill by temporarily increasing your coronary blood flow.   Please note: these test may take anywhere between 2-4 hours to complete  PLEASE REPORT TO [facility] AT THE FIRST DESK WILL DIRECT YOU WHERE TO GO  Date of Procedure:_____________________________________  Arrival Time for Procedure:______________________________  Instructions regarding medication:   PLEASE NOTIFY THE OFFICE AT LEAST 24 HOURS IN ADVANCE IF YOU ARE UNABLE TO KEEP YOUR APPOINTMENT.  [phone] AND  PLEASE NOTIFY NUCLEAR MEDICINE  AT [facility] AT LEAST 24 HOURS IN ADVANCE IF YOU ARE UNABLE TO KEEP YOUR APPOINTMENT. [phone]  How to prepare for your Myoview test:  1. Do not eat or drink after midnight 2. No caffeine for 24 hours prior to test 3. No smoking 24 hours prior to test. 4. Your medication may be taken with water.  If your doctor stopped a medication because of this test, do not take that medication. 5. Ladies, please do not wear dresses.  Skirts or pants are appropriate. Please wear a short sleeve shirt. 6. No perfume, cologne or lotion. 7. Wear comfortable walking shoes. No heels!  Follow-Up: At [facility], you and your health needs are our priority.  As part of our continuing mission to provide you with exceptional heart care, we have created designated Provider Care Teams.  These Care Teams include your primary Cardiologist (physician) and Advanced Practice Providers (APPs -  Physician Assistants and Nurse Practitioners) who all work together to provide you with the care you need, when you need it. You will need a follow up appointment in 1 months.  You may see Dr. Fletcher Anon or Christell Faith, PA-C

## 2018-10-09 LAB — BASIC METABOLIC PANEL
BUN / CREAT RATIO: 24 (ref 10–24)
BUN: 20 mg/dL (ref 8–27)
CO2: 19 mmol/L — ABNORMAL LOW (ref 20–29)
Calcium: 8.8 mg/dL (ref 8.6–10.2)
Chloride: 103 mmol/L (ref 96–106)
Creatinine, Ser: 0.83 mg/dL (ref 0.76–1.27)
GFR calc Af Amer: 104 mL/min/{1.73_m2} (ref >59)
GFR calc non Af Amer: 90 mL/min/{1.73_m2} (ref >59)
Glucose: 114 mg/dL — ABNORMAL HIGH (ref 65–99)
Potassium: 4.1 mmol/L (ref 3.5–5.2)
SODIUM: 139 mmol/L (ref 134–144)

## 2018-10-09 LAB — CBC
Hematocrit: 44.8 % (ref 37.5–51.0)
Hemoglobin: 15.5 g/dL (ref 13.0–17.7)
MCH: 31.4 pg (ref 26.6–33.0)
MCHC: 34.6 g/dL (ref 31.5–35.7)
MCV: 91 fL (ref 79–97)
Platelets: 190 10*3/uL (ref 150–450)
RBC: 4.94 x10E6/uL (ref 4.14–5.80)
RDW: 12.9 % (ref 11.6–15.4)
WBC: 7.5 10*3/uL (ref 3.4–10.8)

## 2018-10-09 LAB — TSH: TSH: 1.13 u[IU]/mL (ref 0.450–4.500)

## 2018-10-09 LAB — MAGNESIUM: Magnesium: 2 mg/dL (ref 1.6–2.3)

## 2018-10-10 ENCOUNTER — Telehealth: Payer: Self-pay

## 2018-10-10 NOTE — Telephone Encounter (Signed)
Pt is returning your call, he states it is ok to leave a detailed message

## 2018-10-10 NOTE — Telephone Encounter (Signed)
Attempted to call patient. LMTCB 2/13

## 2018-10-10 NOTE — Telephone Encounter (Signed)
-----   Message from Rise Mu, PA-C sent at 10/09/2018 10:29 AM EST ----- Random glucose is acceptable.  Renal function is normal and at his baseline.  Potassium is at goal.  Magnesium is at goal.  Thyroid function is normal.  Blood count is normal.   Await scheduled Lexiscan Myoview and continue with metoprolol.

## 2018-10-10 NOTE — Telephone Encounter (Signed)
Left detailed voicemail message with results and instructions to call back if any further questions.

## 2018-10-11 ENCOUNTER — Ambulatory Visit (INDEPENDENT_AMBULATORY_CARE_PROVIDER_SITE_OTHER): Payer: Medicare HMO | Admitting: Internal Medicine

## 2018-10-11 ENCOUNTER — Encounter: Payer: Self-pay | Admitting: Internal Medicine

## 2018-10-11 VITALS — BP 100/60 | HR 81 | Temp 98.4°F | Ht 68.0 in | Wt 186.6 lb

## 2018-10-11 DIAGNOSIS — E041 Nontoxic single thyroid nodule: Secondary | ICD-10-CM

## 2018-10-11 DIAGNOSIS — I493 Ventricular premature depolarization: Secondary | ICD-10-CM

## 2018-10-11 NOTE — Progress Notes (Signed)
Pre visit review using our clinic review tool, if applicable. No additional management support is needed unless otherwise documented below in the visit note. 

## 2018-10-11 NOTE — Progress Notes (Signed)
Chief Complaint  Patient presents with  . Follow-up   F/u  1. PVCS and VT on metoprolol 25 mg bid doing better had episode of CT to 171 stress test upcoming 10/28/2018 and cards f/u 11/06/2018  2. Thyroid nodule due f/u US neck    Review of Systems  Constitutional: Negative for weight loss.  HENT: Negative for hearing loss.   Eyes: Negative for blurred vision.  Respiratory: Negative for shortness of breath.   Cardiovascular: Negative for chest pain.  Gastrointestinal: Negative for abdominal pain.  Musculoskeletal: Negative for falls.  Skin: Negative for rash.  Neurological: Negative for headaches.  Psychiatric/Behavioral: Negative for depression.   Past Medical History:  Diagnosis Date  . Atrial fibrillation ([place])    per pt intermittent since 2003   . Claustrophobia   . Multiple thyroid nodules   . Palpitations   . PVC's (premature ventricular contractions)   . Rotator cuff tear    s/p surgery in 2000 now has tear per MRI ortho [facility] Dr. Edd Fabian last steroid shot (right)   Past Surgical History:  Procedure Laterality Date  . KNEE SURGERY Left   . MOLE REMOVAL  2004   Dr. Collene Schlichter  . SHOULDER SURGERY Right 1999/2000  . TONSILLECTOMY     1956   Family History  Problem Relation Age of Onset  . Melanoma Father        hand and arms  . Arthritis Father   . Cancer Father        throat dx age 13   . Heart disease Father        MI age 78 and CABG hx   . Arthritis Mother   . Stroke Mother        age 50  . Heart disease Mother        heart valve replaced older age   . Renal Disease Paternal Grandmother        [place]    Social History   Socioeconomic History  . Marital status: Single    Spouse name: Not on file  . Number of children: Not on file  . Years of education: Not on file  . Highest education level: Not on file  Occupational History  . Not on file  Social Needs  . Financial resource strain: Not on file  . Food insecurity:    Worry: Not on file     Inability: Not on file  . Transportation needs:    Medical: Not on file    Non-medical: Not on file  Tobacco Use  . Smoking status: Never Smoker  . Smokeless tobacco: Never Used  Substance and Sexual Activity  . Alcohol use: No  . Drug use: No  . Sexual activity: Yes    Comment: women   Lifestyle  . Physical activity:    Days per week: Not on file    Minutes per session: Not on file  . Stress: Not on file  Relationships  . Social connections:    Talks on phone: Not on file    Gets together: Not on file    Attends religious service: Not on file    Active member of club or organization: Not on file    Attends meetings of clubs or organizations: Not on file    Relationship status: Not on file  . Intimate partner violence:    Fear of current or ex partner: Not on file    Emotionally abused: Not on file    Physically abused: Not  on file    Forced sexual activity: Not on file  Other Topics Concern  . Not on file  Social History Narrative   Masters, professor    Single has adult kids   Current Meds  Medication Sig  . ALPRAZolam (XANAX) 0.5 MG tablet Take 1 tablet (0.5 mg total) by mouth as needed for anxiety. Take 15-30 minutes before CT scan as needed,another prn  . Lysine 500 MG TABS Take 1 tablet by mouth daily.  . metoprolol tartrate (LOPRESSOR) 25 MG tablet Take 1 tablet (25 mg total) by mouth 2 (two) times daily.  . Multiple Vitamins-Minerals (MULTIVITAMIN ADULT PO) Take 1 tablet by mouth 2 (two) times daily.   . Omega-3 Fatty Acids (FISH OIL) 1200 MG CAPS Take 1 capsule by mouth 2 (two) times daily.  . Red Yeast Rice 600 MG CAPS Take 1 capsule by mouth 2 (two) times daily.  . saw palmetto 160 MG capsule Take 160 mg by mouth 2 (two) times daily.   No Known Allergies Recent Results (from the past 2160 hour(s))  Cologuard     Status: None   Collection Time: 08/14/18 12:00 AM  Result Value Ref Range   Cologuard Negative     Comment: neg 91/63/84   Basic metabolic  panel     Status: Abnormal   Collection Time: 08/15/18 10:00 AM  Result Value Ref Range   Glucose 93 65 - 99 mg/dL   BUN 20 8 - 27 mg/dL   Creatinine, Ser 0.79 0.76 - 1.27 mg/dL   GFR calc non Af Amer 92 >59 mL/min/1.73   GFR calc Af Amer 106 >59 mL/min/1.73   BUN/Creatinine Ratio 25 (H) 10 - 24   Sodium 139 134 - 144 mmol/L   Potassium 4.5 3.5 - 5.2 mmol/L   Chloride 104 96 - 106 mmol/L   CO2 22 20 - 29 mmol/L   Calcium 9.1 8.6 - 10.2 mg/dL  I-STAT creatinine     Status: None   Collection Time: 08/23/18 10:37 AM  Result Value Ref Range   Creatinine, Ser 0.90 0.61 - 1.24 mg/dL  Basic metabolic panel     Status: Abnormal   Collection Time: 10/08/18  2:26 PM  Result Value Ref Range   Glucose 114 (H) 65 - 99 mg/dL   BUN 20 8 - 27 mg/dL   Creatinine, Ser 0.83 0.76 - 1.27 mg/dL   GFR calc non Af Amer 90 >59 mL/min/1.73   GFR calc Af Amer 104 >59 mL/min/1.73   BUN/Creatinine Ratio 24 10 - 24   Sodium 139 134 - 144 mmol/L   Potassium 4.1 3.5 - 5.2 mmol/L   Chloride 103 96 - 106 mmol/L   CO2 19 (L) 20 - 29 mmol/L   Calcium 8.8 8.6 - 10.2 mg/dL  Magnesium     Status: None   Collection Time: 10/08/18  2:26 PM  Result Value Ref Range   Magnesium 2.0 1.6 - 2.3 mg/dL  TSH     Status: None   Collection Time: 10/08/18  2:26 PM  Result Value Ref Range   TSH 1.130 0.450 - 4.500 uIU/mL  CBC     Status: None   Collection Time: 10/08/18  2:26 PM  Result Value Ref Range   WBC 7.5 3.4 - 10.8 x10E3/uL   RBC 4.94 4.14 - 5.80 x10E6/uL   Hemoglobin 15.5 13.0 - 17.7 g/dL   Hematocrit 44.8 37.5 - 51.0 %   MCV 91 79 - 97 fL   MCH  31.4 26.6 - 33.0 pg   MCHC 34.6 31.5 - 35.7 g/dL   RDW 12.9 11.6 - 15.4 %   Platelets 190 150 - 450 x10E3/uL   Objective  Body mass index is 28.37 kg/m. Wt Readings from Last 3 Encounters:  10/11/18 186 lb 9.6 oz (84.6 kg)  10/08/18 187 lb 8 oz (85 kg)  08/15/18 184 lb (83.5 kg)   Temp Readings from Last 3 Encounters:  10/11/18 98.4 F (36.9 C) (Oral)   08/14/18 98.2 F (36.8 C) (Oral)  07/11/18 98.2 F (36.8 C) (Oral)   BP Readings from Last 3 Encounters:  10/11/18 100/60  10/08/18 124/80  08/15/18 (!) 146/78   Pulse Readings from Last 3 Encounters:  10/11/18 81  10/08/18 92  08/15/18 87    Physical Exam Vitals signs and nursing note reviewed.  Constitutional:      Appearance: Normal appearance. He is well-developed.  HENT:     Head: Normocephalic and atraumatic.     Nose: Nose normal.     Mouth/Throat:     Mouth: Mucous membranes are moist.     Pharynx: Oropharynx is clear.  Eyes:     Conjunctiva/sclera: Conjunctivae normal.     Pupils: Pupils are equal, round, and reactive to light.  Cardiovascular:     Rate and Rhythm: Normal rate and regular rhythm.     Heart sounds: Normal heart sounds.     Comments: PVCs  Pulmonary:     Effort: Pulmonary effort is normal.     Breath sounds: Normal breath sounds.  Skin:    General: Skin is warm and dry.  Neurological:     General: No focal deficit present.     Mental Status: He is alert and oriented to person, place, and time. Mental status is at baseline.     Gait: Gait normal.  Psychiatric:        Attention and Perception: Attention and perception normal.        Mood and Affect: Mood and affect normal.        Speech: Speech normal.        Behavior: Behavior normal. Behavior is cooperative.        Thought Content: Thought content normal.        Cognition and Memory: Cognition and memory normal.        Judgment: Judgment normal.     Assessment   1. PVCs, VT  2. Thyroid nodule  3. HM Plan   1. Stress test 10/28/2018  Cards f/u 11/06/2018  2. Thyroid US  3.  utd flu -prevnar had 06/04/15, pna 23 had 06/02/13 (had at [place] [facility]) ,shingles had 05/25/14(zostervax) -confirm with pharmacy and if not had shingrix disc at f/u  Tdap utd hep B vaccine givenRx prevto get this  cologuard neg 08/05/18 normal  Never smoker Hep C neg 04/2016  PSA6/14/19 1.24check  at f/u  rec hep B vaccine 03/14/17 titer low <10. sAg and core total neg  Follows with [facility]/2018 Provider: Dr. Olivia Mackie McLean-Scocuzza-Internal Medicine

## 2018-10-11 NOTE — Patient Instructions (Addendum)
Thyroid Nodule    A thyroid nodule is an isolated growth of thyroid cells that forms a lump in your thyroid gland. The thyroid gland is a butterfly-shaped gland. It is found in the lower front of your neck. This gland sends chemical messengers (hormones) through your blood to all parts of your body. These hormones are important in regulating your body temperature and helping your body to use energy. Thyroid nodules are common. Most are not cancerous (are benign). You may have one nodule or several nodules.  Different types of thyroid nodules include:  · Nodules that grow and fill with fluid (thyroid cysts).  · Nodules that produce too much thyroid hormone (hot nodules or hyperthyroid).  · Nodules that produce no thyroid hormone (cold nodules or hypothyroid).  · Nodules that form from cancer cells (thyroid cancers).  What are the causes?  Usually, the cause of this condition is not known.  What increases the risk?  Factors that make this condition more likely to develop include:  · Increasing age. Thyroid nodules become more common in people who are older than 70 years of age.  · Gender.  ? Benign thyroid nodules are more common in women.  ? Cancerous (malignant) thyroid nodules are more common in men.  · A family history that includes:  ? Thyroid nodules.  ? Pheochromocytoma.  ? Thyroid carcinoma.  ? Hyperparathyroidism.  · Certain kinds of thyroid diseases, such as Hashimoto thyroiditis.  · Lack of iodine.  · A history of head and neck radiation, such as from X-rays.  What are the signs or symptoms?  It is common for this condition to cause no symptoms. If you have symptoms, they may include:  · A lump in your lower neck.  · Feeling a lump or tickle in your throat.  · Pain in your neck, jaw, or ear.  · Having trouble swallowing.  Hot nodules may cause symptoms that include:  · Weight loss.  · Warm, flushed skin.  · Feeling hot.  · Feeling nervous.  · A racing heartbeat.  Cold nodules may cause symptoms that  include:  · Weight gain.  · Dry skin.  · Brittle hair. This may also occur with hair loss.  · Feeling cold.  · Fatigue.  Thyroid cancer nodules may cause symptoms that include:  · Hard nodules that feel stuck to the thyroid gland.  · Hoarseness.  · Lumps in the glands near your thyroid (lymph nodes).  How is this diagnosed?  A thyroid nodule may be felt by your health care provider during a physical exam. This condition may also be diagnosed based on your symptoms. You may also have tests, including:  · An ultrasound. This may be done to confirm the diagnosis.  · A biopsy. This involves taking a sample from the nodule and looking at it under a microscope to see if the nodule is benign.  · Blood tests to make sure that your thyroid is working properly.  · Imaging tests such as MRI or CT scan may be done if:  ? Your nodule is large.  ? Your nodule is blocking your airway.  ? Cancer is suspected.  How is this treated?  Treatment depends on the cause and size of your nodule or nodules. If the nodule is benign, treatment may not be necessary. Your health care provider may monitor the nodule to see if it goes away without treatment. If the nodule continues to grow, is cancerous, or does not go away:  ·   It may need to be drained with a needle.  · It may need to be removed with surgery.  If you have surgery, part or all of your thyroid gland may need to be removed as well.  Follow these instructions at home:  · Pay attention to any changes in your nodule.  · Take over-the-counter and prescription medicines only as told by your health care provider.  · Keep all follow-up visits as told by your health care provider. This is important.  Contact a health care provider if:  · Your voice changes.  · You have trouble swallowing.  · You have pain in your neck, ear, or jaw that is getting worse.  · Your nodule gets bigger.  · Your nodule starts to make it harder for you to breathe.  Get help right away if:  · You have a sudden  fever.  · You feel very weak.  · Your muscles look like they are shrinking (muscle wasting).  · You have mood swings.  · You feel very restless.  · You feel confused.  · You are seeing or hearing things that other people do not see or hear (having hallucinations).  · You feel suddenly nauseous or throw up.  · You suddenly have diarrhea.  · You have chest pain.  · There is a loss of consciousness.  This information is not intended to replace advice given to you by your health care provider. Make sure you discuss any questions you have with your health care provider.  Document Released: 07/07/2004 Document Revised: 04/16/2016 Document Reviewed: 11/25/2014  Elsevier Interactive Patient Education © 2019 [employer]

## 2018-10-28 ENCOUNTER — Encounter
Admission: RE | Admit: 2018-10-28 | Discharge: 2018-10-28 | Disposition: A | Discharge status: Home / Self Care | Payer: Medicare HMO | Source: Ambulatory Visit | Attending: Physician Assistant | Admitting: Physician Assistant

## 2018-10-28 DIAGNOSIS — I472 Ventricular tachycardia, unspecified: Secondary | ICD-10-CM

## 2018-10-28 MED ORDER — TECHNETIUM TC 99M TETROFOSMIN IV KIT
11.0670 | PACK | Freq: Once | INTRAVENOUS | Status: AC | PRN
  Administered 2018-10-28: 11.067 via INTRAVENOUS

## 2018-11-04 ENCOUNTER — Ambulatory Visit: Admission: RE | Admit: 2018-11-04 | Payer: Medicare HMO | Source: Ambulatory Visit

## 2018-11-04 ENCOUNTER — Telehealth: Payer: Self-pay

## 2018-11-04 NOTE — Telephone Encounter (Signed)
Copied from [place] [id number]. Topic: General - Other >> Nov 04, 2018  3:03 PM Lennox Solders wrote: Reason for CRM: [facility] de[facility] is calling to notify md pt did not show up for appointment today

## 2018-11-06 ENCOUNTER — Ambulatory Visit: Payer: Medicare HMO | Admitting: Physician Assistant

## 2018-11-10 NOTE — Telephone Encounter (Signed)
Pt to call us back when ready to reschedule US thyroid  Inform pt

## 2018-11-15 NOTE — Telephone Encounter (Signed)
Patient was informed.  Patient understood and no questions, comments, or concerns at this time.  

## 2018-11-27 ENCOUNTER — Other Ambulatory Visit: Payer: Self-pay | Admitting: Internal Medicine

## 2018-11-27 DIAGNOSIS — F419 Anxiety disorder, unspecified: Secondary | ICD-10-CM

## 2018-11-27 MED ORDER — ALPRAZOLAM 0.5 MG PO TABS: 0.5000 mg | ORAL_TABLET | ORAL | 0 refills | Status: DC | PRN

## 2018-11-29 ENCOUNTER — Encounter
Admission: RE | Admit: 2018-11-29 | Discharge: 2018-11-29 | Disposition: A | Discharge status: Home / Self Care | Payer: Medicare HMO | Source: Ambulatory Visit | Attending: Physician Assistant | Admitting: Physician Assistant

## 2018-11-29 ENCOUNTER — Telehealth: Payer: Self-pay

## 2018-11-29 ENCOUNTER — Other Ambulatory Visit: Payer: Self-pay

## 2018-11-29 DIAGNOSIS — I2510 Atherosclerotic heart disease of native coronary artery without angina pectoris: Secondary | ICD-10-CM | POA: Diagnosis not present

## 2018-11-29 DIAGNOSIS — I472 Ventricular tachycardia: Secondary | ICD-10-CM | POA: Diagnosis present

## 2018-11-29 LAB — NM MYOCAR MULTI W/SPECT W/WALL MOTION / EF
LV dias vol: 85 mL (ref 62–150)
LV sys vol: 34 mL
Peak HR: 112 {beats}/min
Percent HR: 74 %
Rest HR: 76 {beats}/min
TID: 1.09

## 2018-11-29 MED ORDER — REGADENOSON 0.4 MG/5ML IV SOLN
0.4000 mg | Freq: Once | INTRAVENOUS | Status: AC
  Administered 2018-11-29: 0.4 mg via INTRAVENOUS

## 2018-11-29 MED ORDER — TECHNETIUM TC 99M TETROFOSMIN IV KIT
30.0000 | PACK | Freq: Once | INTRAVENOUS | Status: AC | PRN
  Administered 2018-11-29: 31.81 via INTRAVENOUS

## 2018-11-29 MED ORDER — TECHNETIUM TC 99M TETROFOSMIN IV KIT
10.6300 | PACK | Freq: Once | INTRAVENOUS | Status: AC | PRN
  Administered 2018-11-29: 10.63 via INTRAVENOUS

## 2018-11-29 NOTE — Telephone Encounter (Signed)
-----   Message from Rise Mu, [place] sent at 11/29/2018  2:25 PM EDT ----- Stress test was low risk with a fixed anterior wall defect felt to represent artifact. EF > 70%.  Keep planned E-visit.  Will plan for follow up structural cardiac imaging down the road once COVID-19 restrictions have decreased.

## 2018-11-29 NOTE — Telephone Encounter (Signed)
Call to patient to discuss results from recent stress test. Pt verbalized understanding and had no further questions at this time.  He reports that he was able to look over consent and verbalized agreement.   Notes updated for upcoming web visit with Christell Faith, PA.   No further questions or concerns at this time.   Advised pt to call for any further questions or concerns.

## 2018-12-08 NOTE — Progress Notes (Signed)
Virtual Visit via Video Note   This visit type was conducted due to national recommendations for restrictions regarding the COVID-19 Pandemic (e.g. social distancing) in an effort to limit this patient's exposure and mitigate transmission in our community.  Due to his co-morbid illnesses, this patient is at least at moderate risk for complications without adequate follow up.  This format is felt to be most appropriate for this patient at this time.  All issues noted in this document were discussed and addressed.  A limited physical exam was performed with this format.  Please refer to the patient's chart for his consent to telehealth for [facility].   Evaluation Performed:  Follow-up visit  Date:  12/09/2018   ID:  James Carrillo, DOB 1951-03-25, MRN [id number]  Patient Location: Home  Provider Location: Home  PCP:  McLean-Scocuzza, Nino Glow, MD  Cardiologist:  Kathlyn Sacramento, MD  Electrophysiologist:  Virl Axe, MD  Chief Complaint:  Telehealth follow up  History of Present Illness:    James Carrillo is a 68 y.o. male who presents via audio/video conferencing for a telehealth visit today.  He has a history of reported Afib being diagnosed at age 89 by Dr. Humphrey Rolls at [facility], frequent PVCs, VT, dilated aortic root, and HTN.   Patient was evaluated by Dr. Fletcher Anon in 06/2018 for evaluation of palpitations and PVCs.  He reported at that time a several week history of intermittent palpitations with occasional tachycardia that was usually brief and associated with dizziness and mild shortness of breath without syncope or presyncope.  No chest pain.  He reported regular exercise with swimming at least 4 times per week and walking frequently.  He is not a smoker and drank only an occasional beer.  He reported drinking 1 cup of coffee daily with no other caffeinated products.  There was no family history of premature CAD, sudden death, or arrhythmia.  It was reported his father did  have an MI at the age of 40 and his mother had a valve replacement.  EKG at his visit with Dr. Fletcher Anon in 06/2018 showed sinus rhythm with ventricular bigeminy.  Review of recent labs at that time revealed potassium at goal and normal thyroid function.  Outpatient cardiac monitoring in 06/2018 showed normal sinus rhythm with an average heart rate of 84 bpm.  Frequent PVCs were noted with a total of 128,000 beats in 3 days representing a 36% burden.  Some were in the form of ventricular bigeminy and trigeminy.  4 beat run of NSVT was noted.  Follow-up echo in 07/2018 showed an EF of 55 to 60%, mild LVH, no regional wall motion abnormalities, grade 2 diastolic dysfunction, mildly dilated aortic root, normal RV cavity size and systolic function.  Frequent PVCs were noted during the study.  He was evaluated by electrophysiology on 08/15/2018 and was felt to have asymptomatic PVCs without evidence of left ventricular dysfunction.  No indication for therapy was recommended.  Given the patient's reported tachypalpitations and questionable history of A. fib it was recommended he undergo AliveCor monitoring via his smart phone.  He underwent CTA of the aorta given his incidental aortic root dilatation noted on echo which showed a slight dilatation of the aortic root measuring 4.1 cm at the sinuses of Valsalva.  EP subsequently reviewed patient supplied AliveCor strips, which showed PVCs.  He was seen in follow up in 09/2018 and continued to note intermittent tachypalpitations, lasting anywhere from several minutes to several hours. He continued  to use his AliveCor app with strips provided today showing sinus rhythm with frequent PVCs, occasionally in a pattern of ventricular bigeminy and trigeminy. On 10/03/2018 at 3:46 PM he was noted to have a rhythm consistent with sustained VT with some underlying artifact. This was reviewed by Dr. Caryl Comes who felt like this was true VT. Patient was asymptomatic outside of some  tachypalpitations at that time. EP recommended he start Lopressor 25 mg bid. Labs were unrevealing. He underwent Lexiscan Myoview in 11/2018 that was low risk, probably normal study with an EF of > 70% as below. It was noted attenuation corrected CT images showed no significant coronary artery calcification.   Patient is doing well from a cardiac perspective.  Since he was last seen, he has self decreased Lopressor to 25 mg daily rather than twice daily in the setting of significant constipation and hemorrhoidal bleeding with straining.  With this decrease, he has noted improvement in his bowel movements as well as fatigue.  He also notes significant improvement in his palpitations.  He reports his only episode of sustained palpitations was approximately 2 weeks prior and lasted about 4 hours with spontaneous resolution.  There was no associated dizziness, presyncope, syncope, chest pain, or shortness of breath.  He indicates during this episode he just "tired."  He prefers to remain on metoprolol at this time rather than transitioning to calcium channel blocker or antiarrhythmic therapy.  Otherwise, he does not have any issues or concerns.  The patient does not have symptoms concerning for COVID-19 infection (fever, chills, cough, or new shortness of breath).    Past Medical History:  Diagnosis Date  . Atrial fibrillation ([place])    per pt intermittent since 2003   . Claustrophobia   . Multiple thyroid nodules   . Palpitations   . PVC's (premature ventricular contractions)   . Rotator cuff tear    s/p surgery in 2000 now has tear per MRI ortho [facility] Dr. Edd Fabian last steroid shot (right)   Past Surgical History:  Procedure Laterality Date  . KNEE SURGERY Left   . MOLE REMOVAL  2004   Dr. Collene Schlichter  . SHOULDER SURGERY Right 1999/2000  . TONSILLECTOMY     1956     Current Meds  Medication Sig  . ALPRAZolam (XANAX) 0.5 MG tablet Take 1 tablet (0.5 mg total) by mouth as needed for anxiety.  Take 15-30 minutes before imaging scan as needed,another prn if needed (Patient taking differently: Take 0.5 mg by mouth as needed for anxiety. Take 15-30 minutes before imaging scan or dental procedure as needed,another prn if needed)  . Lysine 500 MG TABS Take 1 tablet by mouth daily.  . metoprolol tartrate (LOPRESSOR) 25 MG tablet Take 1 tablet (25 mg total) by mouth 2 (two) times daily.  . Multiple Vitamins-Minerals (MULTIVITAMIN ADULT PO) Take 1 tablet by mouth daily.   . Omega-3 Fatty Acids (FISH OIL) 1200 MG CAPS Take 1 capsule by mouth 2 (two) times daily.  . Red Yeast Rice 600 MG CAPS Take 1 capsule by mouth 2 (two) times daily.  . saw palmetto 160 MG capsule Take 160 mg by mouth daily.      Allergies:   Patient has no known allergies.   Social History   Tobacco Use  . Smoking status: Never Smoker  . Smokeless tobacco: Never Used  Substance Use Topics  . Alcohol use: No  . Drug use: No     Family Hx: The patient's family history includes Arthritis  in his father and mother; Cancer in his father; Heart disease in his father and mother; Melanoma in his father; Renal Disease in his paternal grandmother; Stroke in his mother.  ROS:   Please see the history of present illness.     All other systems reviewed and are negative.   Prior CV studies:   The following studies were reviewed today:  Outpatient cardiac monitoring 06/2018: Normal sinus rhythm with an average heart rate of 84 bpm. Frequent PVCs noted with a total of 128,000 beats in 3 days (36% burden) some in the form of ventricular bigeminy and trigeminy. 4 beat run of ventricular tachycardia. __________  2D Echo 07/2018: - Left ventricle: The cavity size was normal. Wall thickness was   increased in a pattern of mild LVH. Systolic function was normal.   The estimated ejection fraction was in the range of 55% to 60%.   Wall motion was normal; there were no regional wall motion   abnormalities. Features are  consistent with a pseudonormal left   ventricular filling pattern, with concomitant abnormal relaxation   and increased filling pressure (grade 2 diastolic dysfunction). - Aortic root: The aortic root was mildly dilated. - Right ventricle: The cavity size was normal. Wall thickness was   normal. Systolic function was normal.  Impressions: - Frequent PVC&'s noted during study. __________  [facility] Adam Myoview 11/2018: Low risk, probably normal pharmacologic myocardial perfusion stress test. There is a moderate in size, mild in severity, fixed anterior defect that most likely represents artifact (attenuation, misregistration, and RV insertion) and less likely scar. The left ventricular ejection fraction is normal (>70%). Attenuation correction CT shows no significant coronary artery calcification. __________  Labs/Other Tests and Data Reviewed:    EKG:  No ECG reviewed.  Recent Labs: 02/08/2018: ALT 26 10/08/2018: BUN 20; Creatinine, Ser 0.83; Hemoglobin 15.5; Magnesium 2.0; Platelets 190; Potassium 4.1; Sodium 139; TSH 1.130   Recent Lipid Panel Lab Results  Component Value Date/Time   CHOL 220 (H) 02/08/2018 08:31 AM   TRIG 218.0 (H) 02/08/2018 08:31 AM   HDL 42.00 02/08/2018 08:31 AM   CHOLHDL 5 02/08/2018 08:31 AM   LDLDIRECT 144.0 02/08/2018 08:31 AM    Wt Readings from Last 3 Encounters:  12/09/18 178 lb (80.7 kg)  10/11/18 186 lb 9.6 oz (84.6 kg)  10/08/18 187 lb 8 oz (85 kg)     Objective:    Vital Signs:  Ht 5\' 8"  (1.727 m)   Wt 178 lb (80.7 kg)   BMI 27.06 kg/m    Well nourished, well developed male in no acute distress.   ASSESSMENT & PLAN:    1. Frequent PVCs/VT: Patient notes significant improvement and symptoms following addition of Lopressor.  However, he was noted to have continued frequent PVCs on recent [place] earlier this month.  He has self decreased Lopressor to 25 mg daily secondary to constipation and a small component of fatigue.  With  this self decrease of dose he has noted much improvement in his constipation and fatigue without associated symptomatic worsening of palpitations.  I cannot exclude possible continued frequent PVCs however, he prefers to continue on current medication without changing.  He did agree to transition from Lopressor 25 mg daily to 12.5 mg twice daily.  He will let us know if this leads to a worsening of his constipation or fatigue.  In follow-up, we will need to repeat outpatient cardiac monitoring to evaluate for improvement in ventricular ectopy.  If he is found  to still have significant ectopy would recommend addition or transition to calcium channel blocker as I do not think he will tolerate escalation of beta-blocker given the above.  We could also consider antiarrhythmic therapy though he prefers to avoid this at this time.  Following lifting of COVID-19 restrictions he should undergo structural cardiac imaging.  Recent Myoview low risk with CTA showing no significant coronary artery calcification.  I recommend he keep his follow-up with EP.  2. Mildly dilated aortic root: He will need follow-up imaging in 07/2019.  Optimal BP and heart rate control recommended.  3. Hypertension: No recent home blood pressures.  Continue Lopressor as above.  If BP is running high in follow-up we may need to escalate his antihypertensive therapy, but then he is on Eliquis.  COVID-19 Education: The signs and symptoms of COVID-19 were discussed with the patient and how to seek care for testing (follow up with PCP or arrange E-visit).  The importance of social distancing was discussed today.  Time:   Today, I have spent 15 minutes with the patient with telehealth technology discussing the above problems.     Medication Adjustments/Labs and Tests Ordered: Current medicines are reviewed at length with the patient today.  Concerns regarding medicines are outlined above.  Tests Ordered: No orders of the defined types were  placed in this encounter.  Medication Changes: No orders of the defined types were placed in this encounter.   Disposition:  Follow up in 2 month(s)  Signed, Christell Faith, PA-C  12/09/2018 11:47 AM    [place]

## 2018-12-09 ENCOUNTER — Telehealth (INDEPENDENT_AMBULATORY_CARE_PROVIDER_SITE_OTHER): Payer: Medicare HMO | Admitting: Physician Assistant

## 2018-12-09 ENCOUNTER — Other Ambulatory Visit: Payer: Self-pay

## 2018-12-09 ENCOUNTER — Encounter: Payer: Self-pay | Admitting: Physician Assistant

## 2018-12-09 VITALS — Ht 68.0 in | Wt 178.0 lb

## 2018-12-09 DIAGNOSIS — I493 Ventricular premature depolarization: Secondary | ICD-10-CM | POA: Diagnosis not present

## 2018-12-09 DIAGNOSIS — I472 Ventricular tachycardia, unspecified: Secondary | ICD-10-CM

## 2018-12-09 DIAGNOSIS — I10 Essential (primary) hypertension: Secondary | ICD-10-CM

## 2018-12-09 DIAGNOSIS — I77819 Aortic ectasia, unspecified site: Secondary | ICD-10-CM

## 2018-12-09 DIAGNOSIS — I77810 Thoracic aortic ectasia: Secondary | ICD-10-CM | POA: Diagnosis not present

## 2018-12-09 MED ORDER — METOPROLOL TARTRATE 25 MG PO TABS: 12.5000 mg | ORAL_TABLET | Freq: Two times a day (BID) | ORAL | 3 refills | Status: DC

## 2018-12-09 NOTE — Patient Instructions (Addendum)
It was a pleasure to speak with you on the phone today! Thank you for allowing [place] to continue taking care of your [facility] needs during this time.   Feel free to call as needed for questions and concerns related to your cardiac needs.   Medication Instructions:  Your physician has recommended you make the following change in your medication:  1- DECREASE Lopressor Take 0.5 tablets (12.5 mg total) by mouth 2 (two) times daily. If you need a refill on your cardiac medications before your next appointment, please call your pharmacy.   Lab work: None ordered  If you have labs (blood work) drawn today and your tests are completely normal, you will receive your results only by: Marland Kitchen MyChart Message (if you have MyChart) OR . A paper copy in the mail If you have any lab test that is abnormal or we need to change your treatment, we will call you to review the results.  Testing/Procedures: None ordered   Follow-Up: At [facility], you and your health needs are our priority.  As part of our continuing mission to provide you with exceptional heart care, we have created designated Provider Care Teams.  These Care Teams include your primary Cardiologist (physician) and Advanced Practice Providers (APPs -  Physician Assistants and Nurse Practitioners) who all work together to provide you with the care you need, when you need it. Please follow up with Dr.Arida or Christell Faith, PA in 2 months.   Please follow up with Dr. Caryl Comes in 3-4 months.

## 2018-12-16 ENCOUNTER — Other Ambulatory Visit: Payer: Self-pay

## 2018-12-16 ENCOUNTER — Ambulatory Visit
Admission: RE | Admit: 2018-12-16 | Discharge: 2018-12-16 | Disposition: A | Discharge status: Home / Self Care | Payer: Medicare HMO | Source: Ambulatory Visit | Attending: Internal Medicine | Admitting: Internal Medicine

## 2018-12-16 DIAGNOSIS — E041 Nontoxic single thyroid nodule: Secondary | ICD-10-CM | POA: Diagnosis present

## 2019-01-28 ENCOUNTER — Ambulatory Visit: Payer: Self-pay | Admitting: *Deleted

## 2019-01-28 ENCOUNTER — Other Ambulatory Visit: Payer: Self-pay

## 2019-01-28 ENCOUNTER — Ambulatory Visit (INDEPENDENT_AMBULATORY_CARE_PROVIDER_SITE_OTHER): Payer: Medicare HMO | Admitting: Internal Medicine

## 2019-01-28 DIAGNOSIS — M7540 Impingement syndrome of unspecified shoulder: Secondary | ICD-10-CM | POA: Insufficient documentation

## 2019-01-28 DIAGNOSIS — R5383 Other fatigue: Secondary | ICD-10-CM

## 2019-01-28 DIAGNOSIS — M19049 Primary osteoarthritis, unspecified hand: Secondary | ICD-10-CM | POA: Insufficient documentation

## 2019-01-28 DIAGNOSIS — S30860A Insect bite (nonvenomous) of lower back and pelvis, initial encounter: Secondary | ICD-10-CM | POA: Diagnosis not present

## 2019-01-28 DIAGNOSIS — R51 Headache: Secondary | ICD-10-CM | POA: Diagnosis not present

## 2019-01-28 DIAGNOSIS — W57XXXA Bitten or stung by nonvenomous insect and other nonvenomous arthropods, initial encounter: Secondary | ICD-10-CM | POA: Diagnosis not present

## 2019-01-28 DIAGNOSIS — R269 Unspecified abnormalities of gait and mobility: Secondary | ICD-10-CM

## 2019-01-28 DIAGNOSIS — R519 Headache, unspecified: Secondary | ICD-10-CM

## 2019-01-28 MED ORDER — DOXYCYCLINE HYCLATE 100 MG PO TABS: 100.0000 mg | ORAL_TABLET | Freq: Two times a day (BID) | ORAL | 0 refills | Status: DC

## 2019-01-28 NOTE — Telephone Encounter (Signed)
Scheduled for 2:30 today. See note Virtual scheduled.

## 2019-01-28 NOTE — Progress Notes (Signed)
telephone Note  I connected with James Carrillo   on 01/28/19 at  2:32 PM EDT by telephone and verified that I am speaking with the correct person using two identifiers.  Location patient: home Location provider:work  Persons participating in the virtual visit: patient, provider  I discussed the limitations of evaluation and management by telemedicine and the availability of in person appointments. The patient expressed understanding and agreed to proceed.  HPI: Tike bite left groin noticed 01/23/19 or 01/24/2019 and pulled tick off with tweezers and for 2-3 days c/o h/a yesterday, he thought it maybe sinus related. He c/o being off balance/wobbly with walking and increased fatigue. He has never had tick bite but his sons 1 had lyme and one tick fever     ROS: See pertinent positives and negatives per HPI.  Past Medical History:  Diagnosis Date  . Atrial fibrillation ([place])    per pt intermittent since 2003   . Claustrophobia   . Multiple thyroid nodules   . Palpitations   . PVC's (premature ventricular contractions)   . Rotator cuff tear    s/p surgery in 2000 now has tear per MRI ortho [facility] Dr. Edd Fabian last steroid shot (right)    Past Surgical History:  Procedure Laterality Date  . KNEE SURGERY Left   . MOLE REMOVAL  2004   Dr. Collene Schlichter  . SHOULDER SURGERY Right 1999/2000  . TONSILLECTOMY     1956    Family History  Problem Relation Age of Onset  . Melanoma Father        hand and arms  . Arthritis Father   . Cancer Father        throat dx age 40   . Heart disease Father        MI age 36 and CABG hx   . Arthritis Mother   . Stroke Mother        age 20  . Heart disease Mother        heart valve replaced older age   . 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  ALPRAZolam (XANAX) 0.5 MG tablet, Take 1 tablet (0.5 mg total) by mouth as needed for  anxiety. Take 15-30 minutes before imaging scan as needed,another prn if needed (Patient taking differently: Take 0.5 mg by mouth as needed for anxiety. Take 15-30 minutes before imaging scan or dental procedure as needed,another prn if needed), Disp: 15 tablet, Rfl: 0 .  Lysine 500 MG TABS, Take 1 tablet by mouth daily., Disp: , Rfl:  .  metoprolol tartrate (LOPRESSOR) 25 MG tablet, Take 0.5 tablets (12.5 mg total) by mouth 2 (two) times daily., Disp: 90 tablet, Rfl: 3 .  Multiple Vitamins-Minerals (MULTIVITAMIN ADULT PO), Take 1 tablet by mouth daily. , Disp: , Rfl:  .  Omega-3 Fatty Acids (FISH OIL) 1200 MG CAPS, Take 1 capsule by mouth 2 (two) times daily., Disp: , Rfl:  .  Red Yeast Rice 600 MG CAPS, Take 1 capsule by mouth 2 (two) times daily., Disp: , Rfl:  .  saw palmetto 160 MG capsule, Take 160 mg by mouth daily. , Disp: , Rfl:  .  doxycycline (VIBRA-TABS) 100 MG tablet, Take 1 tablet (100 mg total) by mouth 2 (two) times daily. With food x 10-14 days, Disp: 28 tablet, Rfl: 0  EXAM:  VITALS per patient  if applicable:  GENERAL: alert, oriented, appears well and in no acute distress  PSYCH/NEURO: pleasant and cooperative, no obvious depression or anxiety, speech and thought processing grossly intact  ASSESSMENT AND PLAN:  Discussed the following assessment and plan:  Tick bite, initial encounter  Left groin- Plan: doxycycline (VIBRA-TABS) 100 MG tablet bid x 10-14 days pt does not want to take antibiotics until he finds out test results though I advised normally with tick bite and sxs would rec he start abx, B. burgdorfi antibodies, [place] spotted fvr abs pnl(IgG+IgM)   Prn Tylenol for h/a    Labs tomorrow 2:45 lyme and rmsf   HM utd flu -prevnar had 06/04/15, pna 23 utd ,shingles had 05/25/14(zostervax) -confirm with pharmacy and if not had shingrix disc at f/u  Tdap utd4/9/19  hep B vaccine givenRx prevto get this  cologuard neg 08/05/18 normal  Never smoker Hep  C neg 04/2016  PSA6/14/19 1.24check at f/u  rec hep B vaccine 03/14/17 titer low <10. sAg and core total neg  Follows with [facility]  Check A1c in future h/o hyperglycemia   I discussed the assessment and treatment plan with the patient. The patient was provided an opportunity to ask questions and all were answered. The patient agreed with the plan and demonstrated an understanding of the instructions.   The patient was advised to call back or seek an in-person evaluation if the symptoms worsen or if the condition fails to improve as anticipated.  Time spent 15 minutes Failed Doxy Nino Glow McLean-Scocuzza, MD

## 2019-01-28 NOTE — Telephone Encounter (Signed)
Pt reports removed tick last Friday from left groin area. Unsure how long had been there before removal. States small like poppyseed. Area red, itchy "Very tiny." States now with nausea, mild body aches And "A little off balance, not really dizzy." No fever, no vomiting or headache. Attempted to reach practice for consideration of virtual appt, unable to reach. Pt initially called to request lab work. Pts email and ph number verified.Care advise given. Please [phone] Reason for Disposition . [1] Probable deer tick AND [2] attached > 36 hours (or tick appears swollen, not flat) AND [3] occurred in an area where Lyme disease is common  Answer Assessment - Initial Assessment Questions 1. TYPE of TICK: "Is it a wood tick or a deer tick?" If unsure, ask: "What size was the tick?" "Did it look more like a watermelon seed or a poppy seed?"     Small like poppy seed 2. LOCATION: "Where is the tick bite located?"      Left groin area 3. ONSET: "How long do you think the tick was attached before you removed it?" (Hours or days)      Unsure 4. TETANUS: "When was the last tetanus booster?"      Nausea and dizziness, body aches onset yesterday. "balance off"  Protocols used: TICK BITE-A-AH

## 2019-01-29 ENCOUNTER — Telehealth: Payer: Self-pay | Admitting: Internal Medicine

## 2019-01-29 ENCOUNTER — Other Ambulatory Visit: Payer: Self-pay

## 2019-01-29 ENCOUNTER — Other Ambulatory Visit: Payer: Self-pay | Admitting: Internal Medicine

## 2019-01-29 ENCOUNTER — Other Ambulatory Visit (INDEPENDENT_AMBULATORY_CARE_PROVIDER_SITE_OTHER): Payer: Medicare HMO

## 2019-01-29 DIAGNOSIS — Z0000 Encounter for general adult medical examination without abnormal findings: Secondary | ICD-10-CM

## 2019-01-29 DIAGNOSIS — S30860A Insect bite (nonvenomous) of lower back and pelvis, initial encounter: Secondary | ICD-10-CM

## 2019-01-29 DIAGNOSIS — Z1329 Encounter for screening for other suspected endocrine disorder: Secondary | ICD-10-CM

## 2019-01-29 DIAGNOSIS — Z1389 Encounter for screening for other disorder: Secondary | ICD-10-CM

## 2019-01-29 DIAGNOSIS — W57XXXA Bitten or stung by nonvenomous insect and other nonvenomous arthropods, initial encounter: Secondary | ICD-10-CM

## 2019-01-29 DIAGNOSIS — R739 Hyperglycemia, unspecified: Secondary | ICD-10-CM

## 2019-01-29 DIAGNOSIS — E785 Hyperlipidemia, unspecified: Secondary | ICD-10-CM

## 2019-01-29 DIAGNOSIS — Z125 Encounter for screening for malignant neoplasm of prostate: Secondary | ICD-10-CM

## 2019-01-29 NOTE — Telephone Encounter (Signed)
Call to schedule pt comp labs as well for his prostate, urine, cholesterol, liver kidney #s and screen for diabetes but needs to be fasting and scheduled after 02/09/2019   Thanks [facility]

## 2019-01-30 NOTE — Telephone Encounter (Signed)
Appointment has been made

## 2019-01-31 LAB — ROCKY MTN SPOTTED FVR ABS PNL(IGG+IGM)
RMSF IgG: NEGATIVE
RMSF IgM: 0.19 index (ref 0.00–0.89)

## 2019-01-31 LAB — B. BURGDORFI ANTIBODIES: Lyme IgG/IgM Ab: <0.91 {ISR} (ref 0.00–0.90)

## 2019-02-13 ENCOUNTER — Other Ambulatory Visit (INDEPENDENT_AMBULATORY_CARE_PROVIDER_SITE_OTHER): Payer: Medicare HMO

## 2019-02-13 ENCOUNTER — Other Ambulatory Visit: Payer: Self-pay

## 2019-02-13 DIAGNOSIS — E785 Hyperlipidemia, unspecified: Secondary | ICD-10-CM

## 2019-02-13 DIAGNOSIS — Z0000 Encounter for general adult medical examination without abnormal findings: Secondary | ICD-10-CM

## 2019-02-13 DIAGNOSIS — R739 Hyperglycemia, unspecified: Secondary | ICD-10-CM

## 2019-02-13 DIAGNOSIS — Z125 Encounter for screening for malignant neoplasm of prostate: Secondary | ICD-10-CM

## 2019-02-13 DIAGNOSIS — Z1389 Encounter for screening for other disorder: Secondary | ICD-10-CM

## 2019-02-13 LAB — COMPREHENSIVE METABOLIC PANEL
ALT: 21 U/L (ref 0–53)
AST: 23 U/L (ref 0–37)
Albumin: 4.5 g/dL (ref 3.5–5.2)
Alkaline Phosphatase: 56 U/L (ref 39–117)
BUN: 18 mg/dL (ref 6–23)
CO2: 26 mEq/L (ref 19–32)
Calcium: 9.1 mg/dL (ref 8.4–10.5)
Chloride: 104 mEq/L (ref 96–112)
Creatinine, Ser: 0.97 mg/dL (ref 0.40–1.50)
GFR: 76.49 mL/min (ref >60.00)
Glucose, Bld: 95 mg/dL (ref 70–99)
Potassium: 4.2 mEq/L (ref 3.5–5.1)
Sodium: 139 mEq/L (ref 135–145)
Total Bilirubin: 0.5 mg/dL (ref 0.2–1.2)
Total Protein: 6.4 g/dL (ref 6.0–8.3)

## 2019-02-13 LAB — LIPID PANEL
Cholesterol: 158 mg/dL (ref 0–200)
HDL: 40.5 mg/dL (ref >39.00)
LDL Cholesterol: 80 mg/dL (ref 0–99)
NonHDL: 117.01
Total CHOL/HDL Ratio: 4
Triglycerides: 183 mg/dL — ABNORMAL HIGH (ref 0.0–149.0)
VLDL: 36.6 mg/dL (ref 0.0–40.0)

## 2019-02-13 LAB — HEMOGLOBIN A1C: Hgb A1c MFr Bld: 6 % (ref 4.6–6.5)

## 2019-02-13 LAB — PSA: PSA: 1.08 ng/mL (ref 0.10–4.00)

## 2019-02-14 LAB — URINALYSIS, ROUTINE W REFLEX MICROSCOPIC
Bilirubin, UA: NEGATIVE
Glucose, UA: NEGATIVE
Ketones, UA: NEGATIVE
Leukocytes,UA: NEGATIVE
Nitrite, UA: NEGATIVE
Protein,UA: NEGATIVE
RBC, UA: NEGATIVE
Specific Gravity, UA: 1.023 (ref 1.005–1.030)
Urobilinogen, Ur: 0.2 mg/dL (ref 0.2–1.0)
pH, UA: 5 (ref 5.0–7.5)

## 2019-03-11 ENCOUNTER — Other Ambulatory Visit: Payer: Self-pay | Admitting: Internal Medicine

## 2019-03-11 ENCOUNTER — Encounter: Payer: Self-pay | Admitting: Internal Medicine

## 2019-03-11 DIAGNOSIS — M533 Sacrococcygeal disorders, not elsewhere classified: Secondary | ICD-10-CM

## 2019-04-15 ENCOUNTER — Telehealth: Payer: Medicare HMO | Admitting: Cardiovascular Disease

## 2019-05-01 ENCOUNTER — Ambulatory Visit: Payer: Medicare HMO | Admitting: Internal Medicine

## 2019-05-01 ENCOUNTER — Telehealth: Payer: Medicare HMO | Admitting: Cardiovascular Disease

## 2019-05-29 ENCOUNTER — Other Ambulatory Visit: Payer: Self-pay

## 2019-05-29 ENCOUNTER — Ambulatory Visit: Payer: Medicare HMO | Admitting: Internal Medicine

## 2019-05-29 ENCOUNTER — Encounter: Payer: Self-pay | Admitting: Internal Medicine

## 2019-05-29 VITALS — BP 122/64 | HR 40 | Ht 68.0 in | Wt 180.0 lb

## 2019-05-29 DIAGNOSIS — I472 Ventricular tachycardia, unspecified: Secondary | ICD-10-CM

## 2019-05-29 DIAGNOSIS — I10 Essential (primary) hypertension: Secondary | ICD-10-CM

## 2019-05-29 DIAGNOSIS — I493 Ventricular premature depolarization: Secondary | ICD-10-CM

## 2019-05-29 DIAGNOSIS — I77819 Aortic ectasia, unspecified site: Secondary | ICD-10-CM | POA: Diagnosis not present

## 2019-05-29 NOTE — Patient Instructions (Addendum)
Medication Instructions:  - Your physician recommends that you continue on your current medications as directed. Please refer to the Current Medication list given to you today.  If you need a refill on your cardiac medications before your next appointment, please call your pharmacy.   Lab work: - none ordered  If you have labs (blood work) drawn today and your tests are completely normal, you will receive your results only by: Marland Kitchen MyChart Message (if you have MyChart) OR . A paper copy in the mail If you have any lab test that is abnormal or we need to change your treatment, we will call you to review the results.  Testing/Procedures: - Your physician has requested that you have an echocardiogram. Echocardiography is a painless test that uses sound waves to create images of your heart. It provides your doctor with information about the size and shape of your heart and how well your heart's chambers and valves are working. This procedure takes approximately one hour. There are no restrictions for this procedure.  Follow-Up: At [facility], you and your health needs are our priority.  As part of our continuing mission to provide you with exceptional heart care, we have created designated Provider Care Teams.  These Care Teams include your primary Cardiologist (physician) and Advanced Practice Providers (APPs -  Physician Assistants and Nurse Practitioners) who all work together to provide you with the care you need, when you need it.  . You will need a follow up appointment in 1 year with Dr. Caryl Comes (October 2021) . Please call our office 2 months in advance to schedule this appointment. (Call in early August 2021 to schedule)   Any Other Special Instructions Will Be Listed Below (If Applicable). - N/A   Echocardiogram An echocardiogram is a procedure that uses painless sound waves (ultrasound) to produce an image of the heart. Images from an echocardiogram can provide important information  about:  Signs of coronary artery disease (CAD).  Aneurysm detection. An aneurysm is a weak or damaged part of an artery wall that bulges out from the normal force of blood pumping through the body.  Heart size and shape. Changes in the size or shape of the heart can be associated with certain conditions, including heart failure, aneurysm, and CAD.  Heart muscle function.  Heart valve function.  Signs of a past heart attack.  Fluid buildup around the heart.  Thickening of the heart muscle.  A tumor or infectious growth around the heart valves. Tell a health care provider about:  Any allergies you have.  All medicines you are taking, including vitamins, herbs, eye drops, creams, and over-the-counter medicines.  Any blood disorders you have.  Any surgeries you have had.  Any medical conditions you have.  Whether you are pregnant or may be pregnant. What are the risks? Generally, this is a safe procedure. However, problems may occur, including:  Allergic reaction to dye (contrast) that may be used during the procedure. What happens before the procedure? No specific preparation is needed. You may eat and drink normally. What happens during the procedure?   An IV tube may be inserted into one of your veins.  You may receive contrast through this tube. A contrast is an injection that improves the quality of the pictures from your heart.  A gel will be applied to your chest.  A wand-like tool (transducer) will be moved over your chest. The gel will help to transmit the sound waves from the transducer.  The  sound waves will harmlessly bounce off of your heart to allow the heart images to be captured in real-time motion. The images will be recorded on a computer. The procedure may vary among health care providers and hospitals. What happens after the procedure?  You may return to your normal, everyday life, including diet, activities, and medicines, unless your health care  provider tells you not to do that. Summary  An echocardiogram is a procedure that uses painless sound waves (ultrasound) to produce an image of the heart.  Images from an echocardiogram can provide important information about the size and shape of your heart, heart muscle function, heart valve function, and fluid buildup around your heart.  You do not need to do anything to prepare before this procedure. You may eat and drink normally.  After the echocardiogram is completed, you may return to your normal, everyday life, unless your health care provider tells you not to do that. This information is not intended to replace advice given to you by your health care provider. Make sure you discuss any questions you have with your health care provider. Document Released: 08/11/2000 Document Revised: 12/05/2018 Document Reviewed: 09/16/2016 Elsevier Patient Education  2020 [employer].

## 2019-05-29 NOTE — Progress Notes (Signed)
Patient Care Team: McLean-Scocuzza, Nino Glow, MD as PCP - General (Internal Medicine) Wellington Hampshire, MD as PCP - Cardiology (Cardiology)   HPI  James Carrillo is a 68 y.o. male seen in followup for PVCs, asymptomatic.  Had developed some GI syymptoms prompting decreasing his metoprolol   Initially noted no changes, but then noted some nondescript symptoms and returned to 50 and has felt good  HR on monitor is 40-80 suggesting ongoing but asymptomatic frequent ventricular ectopy    Date PVCs  11/19 36%       DATE TEST EF   12/19 Echo   55-60 % Ao Root 76mm  12/19 CTA  Ao Root 41 mm     Records and Results Reviewed   Past Medical History:  Diagnosis Date  . Atrial fibrillation ([place])    per pt intermittent since 2003   . Claustrophobia   . Multiple thyroid nodules   . Palpitations   . PVC's (premature ventricular contractions)   . Rotator cuff tear    s/p surgery in 2000 now has tear per MRI ortho [facility] Dr. Edd Fabian last steroid shot (right)    Past Surgical History:  Procedure Laterality Date  . KNEE SURGERY Left   . MOLE REMOVAL  2004   Dr. Collene Schlichter  . SHOULDER SURGERY Right 1999/2000  . TONSILLECTOMY     1956    Current Meds  Medication Sig  . ALPRAZolam (XANAX) 0.5 MG tablet Take 1 tablet (0.5 mg total) by mouth as needed for anxiety. Take 15-30 minutes before imaging scan as needed,another prn if needed (Patient taking differently: Take 0.5 mg by mouth as needed for anxiety. Take 15-30 minutes before imaging scan or dental procedure as needed,another prn if needed)  . doxycycline (VIBRA-TABS) 100 MG tablet Take 1 tablet (100 mg total) by mouth 2 (two) times daily. With food x 10-14 days  . Lysine 500 MG TABS Take 1 tablet by mouth daily.  . metoprolol tartrate (LOPRESSOR) 25 MG tablet Take 0.5 tablets (12.5 mg total) by mouth 2 (two) times daily.  . Multiple Vitamins-Minerals (MULTIVITAMIN ADULT PO) Take 1 tablet by mouth daily.   . Omega-3  Fatty Acids (FISH OIL) 1200 MG CAPS Take 1 capsule by mouth 2 (two) times daily.  . Red Yeast Rice 600 MG CAPS Take 1 capsule by mouth 2 (two) times daily.  . saw palmetto 160 MG capsule Take 160 mg by mouth daily.     Allergies  Allergen Reactions  . Other Other (See Comments)    Avoid fluoroquinolones d/t aortic root dilatation       Review of Systems negative except from HPI and PMH  Physical Exam BP 122/64 (BP Location: Left Arm, Patient Position: Sitting, Cuff Size: Normal)   Pulse (!) 40   Ht 5\' 8"  (1.727 m)   Wt 180 lb (81.6 kg)   SpO2 98%   BMI 27.37 kg/m  Well developed and nourished in no acute distress HENT normal Neck supple with JVP-  flat   Clear Regularly irregular rate and rhythm, no murmurs or gallops Abd-soft with active BS No Clubbing cyanosis edema Skin-warm and dry A & Oriented  Grossly normal sensory and motor function  ECG none   Submitted AliveCor strips>> bigeminy reivewed   Assessment and  Plan  PVC  RVOT freq  Tachypalps ? Atrial fib  Aortic Root dilitation     Continue metoprolol No quinolones  Will repeat echo to assess AoRoot and  also the LV function which may deteriorate in the setting of his high density PVC burden    Current medicines are reviewed at length with the patient today .  The patient does not  have concerns regarding medicines.

## 2019-05-30 ENCOUNTER — Other Ambulatory Visit: Payer: Self-pay

## 2019-05-30 MED ORDER — METOPROLOL TARTRATE 25 MG PO TABS: 12.5000 mg | ORAL_TABLET | Freq: Two times a day (BID) | ORAL | 0 refills | Status: DC

## 2019-05-30 NOTE — Telephone Encounter (Signed)
*  STAT* If patient is at the pharmacy, call can be transferred to refill team.   1. Which medications need to be refilled? (please list name of each medication and dose if known)  METOPROLOL 2. Which pharmacy/location (including street and city if local pharmacy) is medication to be sent to? [facility]  3. Do they need a 30 day or 90 day supply? [place]

## 2019-05-31 ENCOUNTER — Other Ambulatory Visit: Payer: Self-pay

## 2019-06-02 ENCOUNTER — Telehealth: Payer: Self-pay | Admitting: Internal Medicine

## 2019-06-02 ENCOUNTER — Other Ambulatory Visit: Payer: Self-pay

## 2019-06-02 ENCOUNTER — Other Ambulatory Visit: Payer: Self-pay | Admitting: Physician Assistant

## 2019-06-02 MED ORDER — METOPROLOL TARTRATE 25 MG PO TABS: 25.0000 mg | ORAL_TABLET | Freq: Two times a day (BID) | ORAL | 3 refills | Status: DC

## 2019-06-02 MED ORDER — METOPROLOL TARTRATE 25 MG PO TABS: 12.5000 mg | ORAL_TABLET | Freq: Two times a day (BID) | ORAL | 3 refills | Status: DC

## 2019-06-02 MED ORDER — METOPROLOL TARTRATE 25 MG PO TABS: 12.5000 mg | ORAL_TABLET | Freq: Two times a day (BID) | ORAL | 1 refills | Status: DC

## 2019-06-02 NOTE — Progress Notes (Signed)
Refilled lopressor 25 mg bid after discussion with the patient.

## 2019-06-02 NOTE — Telephone Encounter (Signed)
Returned call to Pt.  Advised metoprolol had been refilled.  Call placed to Pt's pharmacy.  Per pharmacist his metoprolol was filled on 05/19/2019, so he should have enough medication until 06/18/2019, but Pt is stating he will be out tomorrow.  Had confirmed current dosage with Pt.  Pharmacist states they will follow up with Pt.

## 2019-06-02 NOTE — Telephone Encounter (Signed)
Please call to discuss Metoprolol doseage. Patient will be out tomorrow

## 2019-06-05 ENCOUNTER — Ambulatory Visit: Payer: Medicare HMO | Admitting: Cardiovascular Disease

## 2019-06-09 ENCOUNTER — Telehealth: Payer: Self-pay | Admitting: Internal Medicine

## 2019-06-09 NOTE — Telephone Encounter (Signed)
-----   Message from Emily Filbert, RN sent at 06/09/2019  2:35 PM EDT ----- Patient was supposed to have an echo scheduled. I think he had wanted to call back to schedule but I don't see that he has- per checkout 10/1 w/ SK. Do you mind reaching out to him to schedule please?

## 2019-06-09 NOTE — Telephone Encounter (Signed)
lmov to schedule Echo  °

## 2019-06-18 ENCOUNTER — Ambulatory Visit (INDEPENDENT_AMBULATORY_CARE_PROVIDER_SITE_OTHER): Payer: Medicare HMO

## 2019-06-18 ENCOUNTER — Other Ambulatory Visit: Payer: Self-pay

## 2019-06-18 DIAGNOSIS — I77819 Aortic ectasia, unspecified site: Secondary | ICD-10-CM

## 2019-07-08 ENCOUNTER — Encounter: Payer: Self-pay | Admitting: Family Medicine

## 2019-07-08 ENCOUNTER — Ambulatory Visit (INDEPENDENT_AMBULATORY_CARE_PROVIDER_SITE_OTHER): Payer: Medicare HMO | Admitting: Family Medicine

## 2019-07-08 VITALS — Ht 68.0 in | Wt 180.0 lb

## 2019-07-08 DIAGNOSIS — B029 Zoster without complications: Secondary | ICD-10-CM

## 2019-07-08 MED ORDER — VALACYCLOVIR HCL 1 G PO TABS: 1000.0000 mg | ORAL_TABLET | Freq: Three times a day (TID) | ORAL | 0 refills | Status: DC

## 2019-07-08 NOTE — Progress Notes (Signed)
Patient ID: James Carrillo, male   DOB: 29-Aug-1950, 68 y.o.   MRN: [id number]    Virtual Visit via video Note  This visit type was conducted due to national recommendations for restrictions regarding the COVID-19 pandemic (e.g. social distancing).  This format is felt to be most appropriate for this patient at this time.  All issues noted in this document were discussed and addressed.  No physical exam was performed (except for noted visual exam findings with Video Visits).   I connected with James Carrillo today at  3:00 PM EST by a video enabled telemedicine application and verified that I am speaking with the correct person using two identifiers. Location patient: home Location provider: work or home office Persons participating in the virtual visit: patient, provider  I discussed the limitations, risks, security and privacy concerns of performing an evaluation and management service by video and the availability of in person appointments. I also discussed with the patient that there may be a patient responsible charge related to this service. The patient expressed understanding and agreed to proceed.   HPI:  Patient and I connected via video due to rash on back of neck and suspecting possible shingles.  Patient states the back of neck is a little sore, he did put tea tree oil on the area in question and that did help soothe the skin however once the tea tree oil absorbed the soreness and itchiness of the rash returned.  Patient has had a shingles vaccine.  No fever or chills.  No nausea or vomiting.  No cough, shortness of breath or wheezing.  No other sick symptoms.   ROS: See pertinent positives and negatives per HPI.  Past Medical History:  Diagnosis Date  . Atrial fibrillation ([place])    per pt intermittent since 2003   . Claustrophobia   . Multiple thyroid nodules   . Palpitations   . PVC's (premature ventricular contractions)   . Rotator cuff tear    s/p surgery in 2000 now  has tear per MRI ortho [facility] Dr. Edd Fabian last steroid shot (right)    Past Surgical History:  Procedure Laterality Date  . KNEE SURGERY Left   . MOLE REMOVAL  2004   Dr. Collene Schlichter  . SHOULDER SURGERY Right 1999/2000  . TONSILLECTOMY     1956    Family History  Problem Relation Age of Onset  . Melanoma Father        hand and arms  . Arthritis Father   . Cancer Father        throat dx age 71   . Heart disease Father        MI age 88 and CABG hx   . Arthritis Mother   . Stroke Mother        age 48  . Heart disease Mother        heart valve replaced older age   . Renal Disease Paternal Grandmother        [place]    Social History   Tobacco Use  . Smoking status: Never Smoker  . Smokeless tobacco: Never Used  Substance Use Topics  . Alcohol use: No    Current Outpatient Medications:  .  ALPRAZolam (XANAX) 0.5 MG tablet, Take 1 tablet (0.5 mg total) by mouth as needed for anxiety. Take 15-30 minutes before imaging scan as needed,another prn if needed (Patient taking differently: Take 0.5 mg by mouth as needed for anxiety. Take 15-30 minutes before imaging scan or dental  procedure as needed,another prn if needed), Disp: 15 tablet, Rfl: 0 .  Lysine 500 MG TABS, Take 1 tablet by mouth daily., Disp: , Rfl:  .  metoprolol tartrate (LOPRESSOR) 25 MG tablet, Take 1 tablet (25 mg total) by mouth 2 (two) times daily., Disp: 90 tablet, Rfl: 3 .  Multiple Vitamins-Minerals (MULTIVITAMIN ADULT PO), Take 1 tablet by mouth daily. , Disp: , Rfl:  .  Omega-3 Fatty Acids (FISH OIL) 1200 MG CAPS, Take 1 capsule by mouth 2 (two) times daily., Disp: , Rfl:  .  Red Yeast Rice 600 MG CAPS, Take 1 capsule by mouth 2 (two) times daily., Disp: , Rfl:  .  saw palmetto 160 MG capsule, Take 160 mg by mouth daily. , Disp: , Rfl:  .  doxycycline (VIBRA-TABS) 100 MG tablet, Take 1 tablet (100 mg total) by mouth 2 (two) times daily. With food x 10-14 days, Disp: 28 tablet, Rfl: 0  EXAM:  GENERAL:  alert, oriented, appears well and in no acute distress  HEENT: atraumatic, conjunttiva clear, no obvious abnormalities on inspection of external nose and ears  NECK: normal movements of the head and neck  LUNGS: on inspection no signs of respiratory distress, breathing rate appears normal, no obvious gross SOB, gasping or wheezing  CV: no obvious cyanosis  MS: moves all visible extremities without noticeable abnormality  PSYCH/NEURO: pleasant and cooperative, no obvious depression or anxiety, speech and thought processing grossly intact  ASSESSMENT AND PLAN:  Discussed the following assessment and plan:  Shingles-patient's rash does appear consistent with a shingles outbreak that is somewhat mild.  He will use Valtrex 3 times daily for 7 days and also advised he can continue to use tea tree oil and skin this is soothing the skin.  Also advised to use Tylenol as needed for pain   I discussed the assessment and treatment plan with the patient. The patient was provided an opportunity to ask questions and all were answered. The patient agreed with the plan and demonstrated an understanding of the instructions.   The patient was advised to call back or seek an in-person evaluation if the symptoms worsen or if the condition fails to improve as anticipated.   Jodelle Green, FNP

## 2019-07-10 ENCOUNTER — Encounter: Payer: Self-pay | Admitting: Cardiovascular Disease

## 2019-07-10 ENCOUNTER — Ambulatory Visit (INDEPENDENT_AMBULATORY_CARE_PROVIDER_SITE_OTHER): Payer: Medicare HMO | Admitting: Cardiovascular Disease

## 2019-07-10 ENCOUNTER — Other Ambulatory Visit: Payer: Self-pay

## 2019-07-10 VITALS — BP 120/68 | HR 57 | Ht 68.0 in | Wt 183.5 lb

## 2019-07-10 DIAGNOSIS — I493 Ventricular premature depolarization: Secondary | ICD-10-CM

## 2019-07-10 DIAGNOSIS — I77819 Aortic ectasia, unspecified site: Secondary | ICD-10-CM

## 2019-07-10 NOTE — Patient Instructions (Signed)
Medication Instructions:  Your physician recommends that you continue on your current medications as directed. Please refer to the Current Medication list given to you today.  *If you need a refill on your cardiac medications before your next appointment, please call your pharmacy*  Lab Work: None ordered If you have labs (blood work) drawn today and your tests are completely normal, you will receive your results only by: Marland Kitchen MyChart Message (if you have MyChart) OR . A paper copy in the mail If you have any lab test that is abnormal or we need to change your treatment, we will call you to review the results.  Testing/Procedures: None ordered  Follow-Up: At [facility], you and your health needs are our priority.  As part of our continuing mission to provide you with exceptional heart care, we have created designated Provider Care Teams.  These Care Teams include your primary Cardiologist (physician) and Advanced Practice Providers (APPs -  Physician Assistants and Nurse Practitioners) who all work together to provide you with the care you need, when you need it.  Your next appointment:    Follow up as needed with Dr. Fletcher Anon  Follow up as planeed with Dr. Caryl Comes  The format for your next appointment:   In Person  Provider:   Virl Axe, MD  Other Instructions N/A

## 2019-07-10 NOTE — Progress Notes (Signed)
Cardiology Office Note   Date:  07/10/2019   ID:  James Carrillo, DOB [birth year], MRN [id number]  PCP:  McLean-Scocuzza, Nino Glow, MD  Cardiologist:   Kathlyn Sacramento, MD   Chief Complaint  Patient presents with  . other    3-4 month follow up. Meds reviewed by the pt. verbally. "doing well."       History of Present Illness: James Carrillo is a 68 y.o. male who is here today for follow-up regarding PVCs and mildly dilated aortic root.  He is not diabetic and has no history of hypertension.  He does have hyperlipidemia currently not on medications.  He is not a smoker and drinks only occasional beer.  He drinks 1 cup of coffee daily with no other caffeinated products. He has no family history of premature coronary artery disease, sudden death or arrhythmia.  His father did have an MI at the age of 62 and his mother had valve replacement.  Was seen last year for symptomatic PVCs.  Outpatient monitor showed 36% burden.  I referred him to Dr. Caryl Comes and the patient was started on metoprolol with subsequent improvement in symptoms.  He is doing well with no chest pain, shortness of breath or syncope. Lexiscan Myoview in April of this year showed no evidence of ischemia with normal ejection fraction.   Past Medical History:  Diagnosis Date  . Atrial fibrillation ([place])    per pt intermittent since 2003   . Claustrophobia   . Multiple thyroid nodules   . Palpitations   . PVC's (premature ventricular contractions)   . Rotator cuff tear    s/p surgery in 2000 now has tear per MRI ortho [facility] Dr. Edd Fabian last steroid shot (right)    Past Surgical History:  Procedure Laterality Date  . KNEE SURGERY Left   . MOLE REMOVAL  2004   Dr. Collene Schlichter  . SHOULDER SURGERY Right 1999/2000  . TONSILLECTOMY     1956     Current Outpatient Medications  Medication Sig Dispense Refill  . ALPRAZolam (XANAX) 0.5 MG tablet Take 1 tablet (0.5 mg total) by mouth as needed for anxiety. Take  15-30 minutes before imaging scan as needed,another prn if needed (Patient taking differently: Take 0.5 mg by mouth as needed for anxiety. Take 15-30 minutes before imaging scan or dental procedure as needed,another prn if needed) 15 tablet 0  . Lysine 500 MG TABS Take 1 tablet by mouth daily.    . metoprolol tartrate (LOPRESSOR) 25 MG tablet Take 1 tablet (25 mg total) by mouth 2 (two) times daily. 90 tablet 3  . Multiple Vitamins-Minerals (MULTIVITAMIN ADULT PO) Take 1 tablet by mouth daily.     . Omega-3 Fatty Acids (FISH OIL) 1200 MG CAPS Take 1 capsule by mouth 2 (two) times daily.    . Red Yeast Rice 600 MG CAPS Take 1 capsule by mouth 2 (two) times daily.    . saw palmetto 160 MG capsule Take 160 mg by mouth daily.     . valACYclovir (VALTREX) 1000 MG tablet Take 1 tablet (1,000 mg total) by mouth 3 (three) times daily. 21 tablet 0   No current facility-administered medications for this visit.     Allergies:   Other    Social History:  The patient  reports that he has never smoked. He has never used smokeless tobacco. He reports that he does not drink alcohol or use drugs.   Family History:  The patient's family history  includes Arthritis in his father and mother; Cancer in his father; Heart disease in his father and mother; Melanoma in his father; Renal Disease in his paternal grandmother; Stroke in his mother.    ROS:  Please see the history of present illness.   Otherwise, review of systems are positive for none.   All other systems are reviewed and negative.    PHYSICAL EXAM: VS:  BP 120/68 (BP Location: Left Arm, Patient Position: Sitting, Cuff Size: Normal)   Pulse (!) 57   Ht 5\' 8"  (1.727 m)   Wt 183 lb 8 oz (83.2 kg)   BMI 27.90 kg/m  , BMI Body mass index is 27.9 kg/m. GEN: Well nourished, well developed, in no acute distress  HEENT: normal  Neck: no JVD, carotid bruits, or masses Cardiac: RRR with  premature beats; no murmurs, rubs, or gallops,no edema   Respiratory:  clear to auscultation bilaterally, normal work of breathing GI: soft, nontender, nondistended, + BS MS: no deformity or atrophy  Skin: warm and dry, no rash Neuro:  Strength and sensation are intact Psych: euthymic mood, full affect   EKG:  EKG is not ordered today.    Recent Labs: 10/08/2018: Hemoglobin 15.5; Magnesium 2.0; Platelets 190; TSH 1.130 02/13/2019: ALT 21; BUN 18; Creatinine, Ser 0.97; Potassium 4.2; Sodium 139    Lipid Panel    Component Value Date/Time   CHOL 158 02/13/2019 0809   TRIG 183.0 (H) 02/13/2019 0809   HDL 40.50 02/13/2019 0809   CHOLHDL 4 02/13/2019 0809   VLDL 36.6 02/13/2019 0809   LDLCALC 80 02/13/2019 0809   LDLDIRECT 144.0 02/08/2018 0831      Wt Readings from Last 3 Encounters:  07/10/19 183 lb 8 oz (83.2 kg)  07/08/19 180 lb (81.6 kg)  05/29/19 180 lb (81.6 kg)       PAD Screen 07/12/2018  Previous PAD dx? No  Previous surgical procedure? No  Pain with walking? No  Feet/toe relief with dangling? No  Painful, non-healing ulcers? No  Extremities discolored? No      ASSESSMENT AND PLAN:  1.  Symptomatic PVCs: Reasonably controlled with current dose of metoprolol.  Echocardiogram has shown normal LV systolic function.   I will consider referral to EP.  2.  Mildly dilated aortic root: Agree with yearly surveillance.   Disposition:   Continue follow-up with Dr. Caryl Comes as planned.  He can follow-up with me as needed.  Signed,  Kathlyn Sacramento, MD  07/10/2019 2:56 PM    [place]

## 2019-07-29 ENCOUNTER — Encounter: Payer: Self-pay | Admitting: Internal Medicine

## 2019-07-29 ENCOUNTER — Other Ambulatory Visit: Payer: Self-pay | Admitting: Internal Medicine

## 2019-07-29 DIAGNOSIS — L989 Disorder of the skin and subcutaneous tissue, unspecified: Secondary | ICD-10-CM

## 2019-07-29 DIAGNOSIS — Z1283 Encounter for screening for malignant neoplasm of skin: Secondary | ICD-10-CM

## 2019-08-05 ENCOUNTER — Encounter: Payer: Self-pay | Admitting: Internal Medicine

## 2019-08-05 ENCOUNTER — Ambulatory Visit (INDEPENDENT_AMBULATORY_CARE_PROVIDER_SITE_OTHER): Payer: Medicare HMO | Admitting: Internal Medicine

## 2019-08-05 VITALS — Ht 68.0 in | Wt 183.0 lb

## 2019-08-05 DIAGNOSIS — Z0000 Encounter for general adult medical examination without abnormal findings: Secondary | ICD-10-CM

## 2019-08-05 DIAGNOSIS — R7303 Prediabetes: Secondary | ICD-10-CM

## 2019-08-05 DIAGNOSIS — E785 Hyperlipidemia, unspecified: Secondary | ICD-10-CM | POA: Diagnosis not present

## 2019-08-05 DIAGNOSIS — B029 Zoster without complications: Secondary | ICD-10-CM

## 2019-08-05 DIAGNOSIS — R001 Bradycardia, unspecified: Secondary | ICD-10-CM

## 2019-08-05 MED ORDER — VALACYCLOVIR HCL 1 G PO TABS: 1000.0000 mg | ORAL_TABLET | Freq: Three times a day (TID) | ORAL | 0 refills | Status: DC

## 2019-08-05 NOTE — Patient Instructions (Signed)
Preventing Type 2 Diabetes Mellitus Type 2 diabetes (type 2 diabetes mellitus) is a long-term (chronic) disease that affects blood sugar (glucose) levels. Normally, a hormone called insulin allows glucose to enter cells in the body. The cells use glucose for energy. In type 2 diabetes, one or both of these problems may be present:  The body does not make enough insulin.  The body does not respond properly to insulin that it makes (insulin resistance). Insulin resistance or lack of insulin causes excess glucose to build up in the blood instead of going into cells. As a result, high blood glucose (hyperglycemia) develops, which can cause many complications. Being overweight or obese and having an inactive (sedentary) lifestyle can increase your risk for diabetes. Type 2 diabetes can be delayed or prevented by making certain nutrition and lifestyle changes. What nutrition changes can be made?   Eat healthy meals and snacks regularly. Keep a healthy snack with you for when you get hungry between meals, such as fruit or a handful of nuts.  Eat lean meats and proteins that are low in saturated fats, such as chicken, fish, egg whites, and beans. Avoid processed meats.  Eat plenty of fruits and vegetables and plenty of grains that have not been processed (whole grains). It is recommended that you eat: ? 1?2 cups of fruit every day. ? 2?3 cups of vegetables every day. ? 6?8 oz of whole grains every day, such as oats, whole wheat, bulgur, brown rice, quinoa, and millet.  Eat low-fat dairy products, such as milk, yogurt, and cheese.  Eat foods that contain healthy fats, such as nuts, avocado, olive oil, and canola oil.  Drink water throughout the day. Avoid drinks that contain added sugar, such as soda or sweet tea.  Follow instructions from your health care provider about specific eating or drinking restrictions.  Control how much food you eat at a time (portion size). ? Check food labels to find  out the serving sizes of foods. ? Use a kitchen scale to weigh amounts of foods.  Saute or steam food instead of frying it. Cook with water or broth instead of oils or butter.  Limit your intake of: ? Salt (sodium). Have no more than 1 tsp (2,400 mg) of sodium a day. If you have heart disease or high blood pressure, have less than ? tsp (1,500 mg) of sodium a day. ? Saturated fat. This is fat that is solid at room temperature, such as butter or fat on meat. What lifestyle changes can be made? Activity   Do moderate-intensity physical activity for at least 30 minutes on at least 5 days of the week, or as much as told by your health care provider.  Ask your health care provider what activities are safe for you. A mix of physical activities may be best, such as walking, swimming, cycling, and strength training.  Try to add physical activity into your day. For example: ? Park in spots that are farther away than usual, so that you walk more. For example, park in a far corner of the parking lot when you go to the office or the grocery store. ? Take a walk during your lunch break. ? Use stairs instead of elevators or escalators. Weight Loss  Lose weight as directed. Your health care provider can determine how much weight loss is best for you and can help you lose weight safely.  If you are overweight or obese, you may be instructed to lose at least 5?7 %   of your body weight. Alcohol and Tobacco   Limit alcohol intake to no more than 1 drink a day for nonpregnant women and 2 drinks a day for men. One drink equals 12 oz of beer, 5 oz of wine, or 1 oz of hard liquor.  Do not use any tobacco products, such as cigarettes, chewing tobacco, and e-cigarettes. If you need help quitting, ask your health care provider. Work With [facility] Provider  Have your blood glucose tested regularly, as told by your health care provider.  Discuss your risk factors and how you can reduce your risk for  diabetes.  Get screening tests as told by your health care provider. You may have screening tests regularly, especially if you have certain risk factors for type 2 diabetes.  Make an appointment with a diet and nutrition specialist (registered dietitian). A registered dietitian can help you make a healthy eating plan and can help you understand portion sizes and food labels. Why are these changes important?  It is possible to prevent or delay type 2 diabetes and related health problems by making lifestyle and nutrition changes.  It can be difficult to recognize signs of type 2 diabetes. The best way to avoid possible damage to your body is to take actions to prevent the disease before you develop symptoms. What can happen if changes are not made?  Your blood glucose levels may keep increasing. Having high blood glucose for a long time is dangerous. Too much glucose in your blood can damage your blood vessels, heart, kidneys, nerves, and eyes.  You may develop prediabetes or type 2 diabetes. Type 2 diabetes can lead to many chronic health problems and complications, such as: ? Heart disease. ? Stroke. ? Blindness. ? Kidney disease. ? Depression. ? Poor circulation in the feet and legs, which could lead to surgical removal (amputation) in severe cases. Where to find support  Ask your health care provider to recommend a registered dietitian, diabetes educator, or weight loss program.  Look for local or online weight loss groups.  Join a gym, fitness club, or outdoor activity group, such as a walking club. Where to find more information To learn more about diabetes and diabetes prevention, visit:  American Diabetes Association (ADA): www.diabetes.[employer] of Diabetes and Digestive and Kidney Diseases: FindSpin.nl To learn more about healthy eating, visit:  The U.S. Department of Agriculture Scientist, research (physical sciences)), Choose My Plate:  http://wiley-williams.com/  Office of Disease Prevention and Health Promotion (ODPHP), Dietary Guidelines: SurferLive.at Summary  You can reduce your risk for type 2 diabetes by increasing your physical activity, eating healthy foods, and losing weight as directed.  Talk with your health care provider about your risk for type 2 diabetes. Ask about any blood tests or screening tests that you need to have. This information is not intended to replace advice given to you by your health care provider. Make sure you discuss any questions you have with your health care provider. Document Released: 12/06/2015 Document Revised: 12/06/2018 Document Reviewed: 10/05/2015 Elsevier Patient Education  2020 [place].  Prediabetes Eating Plan Prediabetes is a condition that causes blood sugar (glucose) levels to be higher than normal. This increases the risk for developing diabetes. In order to prevent diabetes from developing, your health care provider may recommend a diet and other lifestyle changes to help you:  Control your blood glucose levels.  Improve your cholesterol levels.  Manage your blood pressure. Your health care provider may recommend working with a diet and  nutrition specialist (dietitian) to make a meal plan that is best for you. What are tips for following this plan? Lifestyle  Set weight loss goals with the help of your health care team. It is recommended that most people with prediabetes lose 7% of their current body weight.  Exercise for at least 30 minutes at least 5 days a week.  Attend a support group or seek ongoing support from a mental health counselor.  Take over-the-counter and prescription medicines only as told by your health care provider. Reading food labels  Read food labels to check the amount of fat, salt (sodium), and sugar in prepackaged foods. Avoid foods that have: ? Saturated fats. ? Trans fats. ? Added sugars.  Avoid foods  that have more than 300 milligrams (mg) of sodium per serving. Limit your daily sodium intake to less than 2,300 mg each day. Shopping  Avoid buying pre-made and processed foods. Cooking  Cook with olive oil. Do not use butter, lard, or ghee.  Bake, broil, grill, or boil foods. Avoid frying. Meal planning   Work with your dietitian to develop an eating plan that is right for you. This may include: ? Tracking how many calories you take in. Use a food diary, notebook, or mobile application to track what you eat at each meal. ? Using the glycemic index (GI) to plan your meals. The index tells you how quickly a food will raise your blood glucose. Choose low-GI foods. These foods take a longer time to raise blood glucose.  Consider following a Mediterranean diet. This diet includes: ? Several servings each day of fresh fruits and vegetables. ? Eating fish at least twice a week. ? Several servings each day of whole grains, beans, nuts, and seeds. ? Using olive oil instead of other fats. ? Moderate alcohol consumption. ? Eating small amounts of red meat and whole-fat dairy.  If you have high blood pressure, you may need to limit your sodium intake or follow a diet such as the DASH eating plan. DASH is an eating plan that aims to lower high blood pressure. What foods are recommended? The items listed below may not be a complete list. Talk with your dietitian about what dietary choices are best for you. Grains Whole grains, such as whole-wheat or whole-grain breads, crackers, cereals, and pasta. Unsweetened oatmeal. Bulgur. Barley. Quinoa. Brown rice. Corn or whole-wheat flour tortillas or taco shells. Vegetables Lettuce. Spinach. Peas. Beets. Cauliflower. Cabbage. Broccoli. Carrots. Tomatoes. Squash. Eggplant. Herbs. Peppers. Onions. Cucumbers. Brussels sprouts. Fruits Berries. Bananas. Apples. Oranges. Grapes. Papaya. Mango. Pomegranate. Kiwi. Grapefruit. Cherries. Meats and other protein  foods Seafood. Poultry without skin. Lean cuts of pork and beef. Tofu. Eggs. Nuts. Beans. Dairy Low-fat or fat-free dairy products, such as yogurt, cottage cheese, and cheese. Beverages Water. Tea. Coffee. Sugar-free or diet soda. Seltzer water. Lowfat or no-fat milk. Milk alternatives, such as soy or almond milk. Fats and oils Olive oil. Canola oil. Sunflower oil. Grapeseed oil. Avocado. Walnuts. Sweets and desserts Sugar-free or low-fat pudding. Sugar-free or low-fat ice cream and other frozen treats. Seasoning and other foods Herbs. Sodium-free spices. Mustard. Relish. Low-fat, low-sugar ketchup. Low-fat, low-sugar barbecue sauce. Low-fat or fat-free mayonnaise. What foods are not recommended? The items listed below may not be a complete list. Talk with your dietitian about what dietary choices are best for you. Grains Refined white flour and flour products, such as bread, pasta, snack foods, and cereals. Vegetables Canned vegetables. Frozen vegetables with butter or cream sauce. Fruits  Fruits canned with syrup. Meats and other protein foods Fatty cuts of meat. Poultry with skin. Breaded or fried meat. Processed meats. Dairy Full-fat yogurt, cheese, or milk. Beverages Sweetened drinks, such as sweet iced tea and soda. Fats and oils Butter. Lard. Ghee. Sweets and desserts Baked goods, such as cake, cupcakes, pastries, cookies, and cheesecake. Seasoning and other foods Spice mixes with added salt. Ketchup. Barbecue sauce. Mayonnaise. Summary  To prevent diabetes from developing, you may need to make diet and other lifestyle changes to help control blood sugar, improve cholesterol levels, and manage your blood pressure.  Set weight loss goals with the help of your health care team. It is recommended that most people with prediabetes lose 7 percent of their current body weight.  Consider following a Mediterranean diet that includes plenty of fresh fruits and vegetables, whole  grains, beans, nuts, seeds, fish, lean meat, low-fat dairy, and healthy oils. This information is not intended to replace advice given to you by your health care provider. Make sure you discuss any questions you have with your health care provider. Document Released: 12/29/2014 Document Revised: 12/06/2018 Document Reviewed: 10/18/2016 Elsevier Patient Education  2020 [place].  High Cholesterol  High cholesterol is a condition in which the blood has high levels of a white, waxy, fat-like substance (cholesterol). The human body needs small amounts of cholesterol. The liver makes all the cholesterol that the body needs. Extra (excess) cholesterol comes from the food that we eat. Cholesterol is carried from the liver by the blood through the blood vessels. If you have high cholesterol, deposits (plaques) may build up on the walls of your blood vessels (arteries). Plaques make the arteries narrower and stiffer. Cholesterol plaques increase your risk for heart attack and stroke. Work with your health care provider to keep your cholesterol levels in a healthy range. What increases the risk? This condition is more likely to develop in people who:  Eat foods that are high in animal fat (saturated fat) or cholesterol.  Are overweight.  Are not getting enough exercise.  Have a family history of high cholesterol. What are the signs or symptoms? There are no symptoms of this condition. How is this diagnosed? This condition may be diagnosed from the results of a blood test.  If you are older than age 64, your health care provider may check your cholesterol every 4-6 years.  You may be checked more often if you already have high cholesterol or other risk factors for heart disease. The blood test for cholesterol measures:  "Bad" cholesterol (LDL cholesterol). This is the main type of cholesterol that causes heart disease. The desired level for LDL is less than 100.  "Good" cholesterol (HDL  cholesterol). This type helps to protect against heart disease by cleaning the arteries and carrying the LDL away. The desired level for HDL is 60 or higher.  Triglycerides. These are fats that the body can store or burn for energy. The desired number for triglycerides is lower than 150.  Total cholesterol. This is a measure of the total amount of cholesterol in your blood, including LDL cholesterol, HDL cholesterol, and triglycerides. A healthy number is less than 200. How is this treated? This condition is treated with diet changes, lifestyle changes, and medicines. Diet changes  This may include eating more whole grains, fruits, vegetables, nuts, and fish.  This may also include cutting back on red meat and foods that have a lot of added sugar. Lifestyle changes  Changes may include getting  at least 40 minutes of aerobic exercise 3 times a week. Aerobic exercises include walking, biking, and swimming. Aerobic exercise along with a healthy diet can help you maintain a healthy weight.  Changes may also include quitting smoking. Medicines  Medicines are usually given if diet and lifestyle changes have failed to reduce your cholesterol to healthy levels.  Your health care provider may prescribe a statin medicine. Statin medicines have been shown to reduce cholesterol, which can reduce the risk of heart disease. Follow these instructions at home: Eating and drinking If told by your health care provider:  Eat chicken (without skin), fish, veal, shellfish, ground [place] breast, and round or loin cuts of red meat.  Do not eat fried foods or fatty meats, such as hot dogs and salami.  Eat plenty of fruits, such as apples.  Eat plenty of vegetables, such as broccoli, potatoes, and carrots.  Eat beans, peas, and lentils.  Eat grains such as barley, rice, couscous, and bulgur wheat.  Eat pasta without cream sauces.  Use skim or nonfat milk, and eat low-fat or nonfat yogurt and cheeses.   Do not eat or drink whole milk, cream, ice cream, egg yolks, or hard cheeses.  Do not eat stick margarine or tub margarines that contain trans fats (also called partially hydrogenated oils).  Do not eat saturated tropical oils, such as coconut oil and palm oil.  Do not eat cakes, cookies, crackers, or other baked goods that contain trans fats.  General instructions  Exercise as directed by your health care provider. Increase your activity level with activities such as gardening, walking, and taking the stairs.  Take over-the-counter and prescription medicines only as told by your health care provider.  Do not use any products that contain nicotine or tobacco, such as cigarettes and e-cigarettes. If you need help quitting, ask your health care provider.  Keep all follow-up visits as told by your health care provider. This is important. Contact a health care provider if:  You are struggling to maintain a healthy diet or weight.  You need help to start on an exercise program.  You need help to stop smoking. Get help right away if:  You have chest pain.  You have trouble breathing. This information is not intended to replace advice given to you by your health care provider. Make sure you discuss any questions you have with your health care provider. Document Released: 08/14/2005 Document Revised: 08/17/2017 Document Reviewed: 02/12/2016 Elsevier Patient Education  [place].  Cholesterol Content in Foods Cholesterol is a waxy, fat-like substance that helps to carry fat in the blood. The body needs cholesterol in small amounts, but too much cholesterol can cause damage to the arteries and heart. Most people should eat less than 200 milligrams (mg) of cholesterol a day. Foods with cholesterol  Cholesterol is found in animal-based foods, such as meat, seafood, and dairy. Generally, low-fat dairy and lean meats have less cholesterol than full-fat dairy and fatty meats. The  milligrams of cholesterol per serving (mg per serving) of common cholesterol-containing foods are listed below. Meat and other proteins  Egg - one large whole egg has 186 mg.  Veal shank - 4 oz has 141 mg.  Lean ground [place] (93% lean) - 4 oz has 118 mg.  Fat-trimmed lamb loin - 4 oz has 106 mg.  Lean ground beef (90% lean) - 4 oz has 100 mg.  Lobster - 3.5 oz has 90 mg.  Pork loin chops - 4 oz  has 86 mg.  Canned salmon - 3.5 oz has 83 mg.  Fat-trimmed beef top loin - 4 oz has 78 mg.  Frankfurter - 1 frank (3.5 oz) has 77 mg.  Crab - 3.5 oz has 71 mg.  Roasted chicken without skin, white meat - 4 oz has 66 mg.  Light bologna - 2 oz has 45 mg.  Deli-cut [place] - 2 oz has 31 mg.  Canned tuna - 3.5 oz has 31 mg.  Bacon - 1 oz has 29 mg.  Oysters and mussels (raw) - 3.5 oz has 25 mg.  Mackerel - 1 oz has 22 mg.  Trout - 1 oz has 20 mg.  Pork sausage - 1 link (1 oz) has 17 mg.  Salmon - 1 oz has 16 mg.  Tilapia - 1 oz has 14 mg. Dairy  Soft-serve ice cream -  cup (4 oz) has 103 mg.  Whole-milk yogurt - 1 cup (8 oz) has 29 mg.  Cheddar cheese - 1 oz has 28 mg.  American cheese - 1 oz has 28 mg.  Whole milk - 1 cup (8 oz) has 23 mg.  2% milk - 1 cup (8 oz) has 18 mg.  Cream cheese - 1 tablespoon (Tbsp) has 15 mg.  Cottage cheese -  cup (4 oz) has 14 mg.  Low-fat (1%) milk - 1 cup (8 oz) has 10 mg.  Sour cream - 1 Tbsp has 8.5 mg.  Low-fat yogurt - 1 cup (8 oz) has 8 mg.  Nonfat Greek yogurt - 1 cup (8 oz) has 7 mg.  Half-and-half cream - 1 Tbsp has 5 mg. Fats and oils  Cod liver oil - 1 tablespoon (Tbsp) has 82 mg.  Butter - 1 Tbsp has 15 mg.  Lard - 1 Tbsp has 14 mg.  Bacon grease - 1 Tbsp has 14 mg.  Mayonnaise - 1 Tbsp has 5-10 mg.  Margarine - 1 Tbsp has 3-10 mg. Exact amounts of cholesterol in these foods may vary depending on specific ingredients and brands. Foods without cholesterol Most plant-based foods do not have  cholesterol unless you combine them with a food that has cholesterol. Foods without cholesterol include:  Grains and cereals.  Vegetables.  Fruits.  Vegetable oils, such as olive, canola, and sunflower oil.  Legumes, such as peas, beans, and lentils.  Nuts and seeds.  Egg whites. Summary  The body needs cholesterol in small amounts, but too much cholesterol can cause damage to the arteries and heart.  Most people should eat less than 200 milligrams (mg) of cholesterol a day. This information is not intended to replace advice given to you by your health care provider. Make sure you discuss any questions you have with your health care provider. Document Released: 04/10/2017 Document Revised: 07/27/2017 Document Reviewed: 04/10/2017 Elsevier Patient Education  2020 [employer].

## 2019-08-05 NOTE — Progress Notes (Signed)
Virtual Visit via Video Note  I connected with James Carrillo  on 08/05/19 at 10:30 AM EST by a video enabled telemedicine application and verified that I am speaking with the correct person using two identifiers.  Location patient: home Location provider:work or home office Persons participating in the virtual visit: patient, provider  I discussed the limitations of evaluation and management by telemedicine and the availability of in person appointments. The patient expressed understanding and agreed to proceed.   HPI: 1. shiingles left neck 06/30/2019 still there and itchy and go to left shoulder tried tea tree oil otc still has residual lesions w/o pain  2. Derm saw 07/31/2019 and bx to lesion but no result yet on trunk  3. HLD TGS 183 will repeat and A1C 6.0 prediabetes he reports eats chicken, fish, rare pork or beef 4. Echo reviewed 05/2019 with h/o pvcs and aortic root dilation from 4.1 to 4.3 cm CTA repeat in 1 year per cards    ROS: See pertinent positives and negatives per HPI.  Past Medical History:  Diagnosis Date  . Atrial fibrillation ([place])    per pt intermittent since 2003   . Claustrophobia   . Multiple thyroid nodules   . Palpitations   . PVC's (premature ventricular contractions)   . Rotator cuff tear    s/p surgery in 2000 now has tear per MRI ortho [facility] Dr. Edd Fabian last steroid shot (right)    Past Surgical History:  Procedure Laterality Date  . KNEE SURGERY Left   . MOLE REMOVAL  2004   Dr. Collene Schlichter  . SHOULDER SURGERY Right 1999/2000  . TONSILLECTOMY     1956    Family History  Problem Relation Age of Onset  . Melanoma Father        hand and arms  . Arthritis Father   . Cancer Father        throat dx age 63   . Heart disease Father        MI age 56 and CABG hx   . Arthritis Mother   . Stroke Mother        age 74  . Heart disease Mother        heart valve replaced older age   . Renal Disease Paternal Grandmother        [place]      SOCIAL HX:  Masters, professor  Single has adult kids   Current Outpatient Medications:  .  ALPRAZolam (XANAX) 0.5 MG tablet, Take 1 tablet (0.5 mg total) by mouth as needed for anxiety. Take 15-30 minutes before imaging scan as needed,another prn if needed (Patient taking differently: Take 0.5 mg by mouth as needed for anxiety. Take 15-30 minutes before imaging scan or dental procedure as needed,another prn if needed), Disp: 15 tablet, Rfl: 0 .  Lysine 500 MG TABS, Take 1 tablet by mouth daily., Disp: , Rfl:  .  metoprolol tartrate (LOPRESSOR) 25 MG tablet, Take 1 tablet (25 mg total) by mouth 2 (two) times daily., Disp: 90 tablet, Rfl: 3 .  Multiple Vitamins-Minerals (MULTIVITAMIN ADULT PO), Take 1 tablet by mouth daily. , Disp: , Rfl:  .  Omega-3 Fatty Acids (FISH OIL) 1200 MG CAPS, Take 1 capsule by mouth 2 (two) times daily., Disp: , Rfl:  .  Red Yeast Rice 600 MG CAPS, Take 1 capsule by mouth 2 (two) times daily., Disp: , Rfl:  .  saw palmetto 160 MG capsule, Take 160 mg by mouth daily. , Disp: ,  Rfl:  .  valACYclovir (VALTREX) 1000 MG tablet, Take 1 tablet (1,000 mg total) by mouth 3 (three) times daily., Disp: 21 tablet, Rfl: 0  EXAM:  VITALS per patient if applicable:  GENERAL: alert, oriented, appears well and in no acute distress  HEENT: atraumatic, conjunttiva clear, no obvious abnormalities on inspection of external nose and ears  NECK: normal movements of the head and neck  LUNGS: on inspection no signs of respiratory distress, breathing rate appears normal, no obvious gross SOB, gasping or wheezing  CV: no obvious cyanosis  MS: moves all visible extremities without noticeable abnormality  PSYCH/NEURO: pleasant and cooperative, no obvious depression or anxiety, speech and thought processing grossly intact  ASSESSMENT AND PLAN:  Discussed the following assessment and plan:  Herpes zoster without complication - Plan: valACYclovir (VALTREX) 1000 MG tablet tid x 1  week  Hyperlipidemia, unspecified hyperlipidemia type - Plan: Comprehensive metabolic panel, CBC with Differential/Platelet, Lipid panel Prediabetes - Plan: Comprehensive metabolic panel, CBC with Differential/Platelet, HgB A1c -will my chart info  sch fasting labs   Bradycardia with pvcs  F/u with cards   HM utd flu -prevnar had 06/04/15, pna 23 utd ,shingles had 05/25/14(zostervax) -confirm with pharmacy and if not had shingrix has not had due to cost $150  Tdap utd4/9/19  hep B vaccine givenRx prevto get this  cologuard neg 08/05/18 normal Never smoker Hep C neg 04/2016  PSA6/18/20 1.08 check in 1 year  rec hep B vaccine 03/14/17 titer low <10. sAg and core total neg  [place] dermseen12/10/2018 bx pending sch fasting labs     -we discussed possible serious and likely etiologies, options for evaluation and workup, limitations of telemedicine visit vs in person visit, treatment, treatment risks and precautions. Pt prefers to treat via telemedicine empirically rather then risking or undertaking an in person visit at this moment. Patient agrees to seek prompt in person care if worsening, new symptoms arise, or if is not improving with treatment.   I discussed the assessment and treatment plan with the patient. The patient was provided an opportunity to ask questions and all were answered. The patient agreed with the plan and demonstrated an understanding of the instructions.   The patient was advised to call back or seek an in-person evaluation if the symptoms worsen or if the condition fails to improve as anticipated.  Time spent 15 minutes  Delorise Jackson, MD

## 2019-08-06 ENCOUNTER — Telehealth: Payer: Self-pay | Admitting: Internal Medicine

## 2019-08-06 NOTE — Telephone Encounter (Signed)
I called pt and left a vm to call ofc to schedule Return for sch fasting labs asap and f/u in 4-6 months.

## 2019-08-07 ENCOUNTER — Other Ambulatory Visit: Payer: Self-pay | Admitting: Internal Medicine

## 2019-08-07 ENCOUNTER — Encounter: Payer: Self-pay | Admitting: Internal Medicine

## 2019-08-18 ENCOUNTER — Other Ambulatory Visit: Payer: Self-pay

## 2019-08-20 ENCOUNTER — Other Ambulatory Visit: Payer: Self-pay

## 2019-08-20 ENCOUNTER — Other Ambulatory Visit (INDEPENDENT_AMBULATORY_CARE_PROVIDER_SITE_OTHER): Payer: Medicare HMO

## 2019-08-20 DIAGNOSIS — E785 Hyperlipidemia, unspecified: Secondary | ICD-10-CM

## 2019-08-20 DIAGNOSIS — R7303 Prediabetes: Secondary | ICD-10-CM | POA: Diagnosis not present

## 2019-08-20 DIAGNOSIS — Z0000 Encounter for general adult medical examination without abnormal findings: Secondary | ICD-10-CM

## 2019-08-20 LAB — CBC WITH DIFFERENTIAL/PLATELET
Basophils Absolute: 0 10*3/uL (ref 0.0–0.1)
Basophils Relative: 0.7 % (ref 0.0–3.0)
Eosinophils Absolute: 0.1 10*3/uL (ref 0.0–0.7)
Eosinophils Relative: 1.8 % (ref 0.0–5.0)
HCT: 49 % (ref 39.0–52.0)
Hemoglobin: 16.7 g/dL (ref 13.0–17.0)
Lymphocytes Relative: 37.1 % (ref 12.0–46.0)
Lymphs Abs: 2.8 10*3/uL (ref 0.7–4.0)
MCHC: 34.2 g/dL (ref 30.0–36.0)
MCV: 94.6 fl (ref 78.0–100.0)
Monocytes Absolute: 0.9 10*3/uL (ref 0.1–1.0)
Monocytes Relative: 12.2 % — ABNORMAL HIGH (ref 3.0–12.0)
Neutro Abs: 3.6 10*3/uL (ref 1.4–7.7)
Neutrophils Relative %: 48.2 % (ref 43.0–77.0)
Platelets: 166 10*3/uL (ref 150.0–400.0)
RBC: 5.18 Mil/uL (ref 4.22–5.81)
RDW: 14 % (ref 11.5–15.5)
WBC: 7.5 10*3/uL (ref 4.0–10.5)

## 2019-08-20 LAB — COMPREHENSIVE METABOLIC PANEL
ALT: 20 U/L (ref 0–53)
AST: 22 U/L (ref 0–37)
Albumin: 4.6 g/dL (ref 3.5–5.2)
Alkaline Phosphatase: 54 U/L (ref 39–117)
BUN: 17 mg/dL (ref 6–23)
CO2: 29 mEq/L (ref 19–32)
Calcium: 9.2 mg/dL (ref 8.4–10.5)
Chloride: 101 mEq/L (ref 96–112)
Creatinine, Ser: 0.97 mg/dL (ref 0.40–1.50)
GFR: 76.37 mL/min (ref >60.00)
Glucose, Bld: 90 mg/dL (ref 70–99)
Potassium: 4.2 mEq/L (ref 3.5–5.1)
Sodium: 139 mEq/L (ref 135–145)
Total Bilirubin: 0.5 mg/dL (ref 0.2–1.2)
Total Protein: 6.7 g/dL (ref 6.0–8.3)

## 2019-08-20 LAB — LIPID PANEL
Cholesterol: 164 mg/dL (ref 0–200)
HDL: 39.5 mg/dL (ref >39.00)
NonHDL: 124.74
Total CHOL/HDL Ratio: 4
Triglycerides: 214 mg/dL — ABNORMAL HIGH (ref 0.0–149.0)
VLDL: 42.8 mg/dL — ABNORMAL HIGH (ref 0.0–40.0)

## 2019-08-20 LAB — HEMOGLOBIN A1C: Hgb A1c MFr Bld: 5.7 % (ref 4.6–6.5)

## 2019-08-20 LAB — LDL CHOLESTEROL, DIRECT: Direct LDL: 95 mg/dL

## 2019-08-21 ENCOUNTER — Other Ambulatory Visit: Payer: Medicare HMO

## 2019-09-12 ENCOUNTER — Telehealth: Payer: Self-pay | Admitting: Internal Medicine

## 2019-09-12 ENCOUNTER — Encounter: Payer: Self-pay | Admitting: *Deleted

## 2019-09-12 DIAGNOSIS — I77819 Aortic ectasia, unspecified site: Secondary | ICD-10-CM

## 2019-09-12 NOTE — Telephone Encounter (Signed)
The patient is due for a follow up CTA chest/ aorta. This is for yearly follow up of his dilated aortic root.  Order placed for annual CTA chest/ aorta. Letter mailed to the patient to please call to schedule [phone] at his convenience to schedule.

## 2019-10-03 ENCOUNTER — Telehealth: Payer: Self-pay | Admitting: Cardiovascular Disease

## 2019-10-03 NOTE — Telephone Encounter (Signed)
   [facility] Pre-operative Risk Assessment    Request for surgical clearance:  1. What type of surgery is being performed? L Shoulder arthroscopic    2. When is this surgery scheduled? TBD  3. What type of clearance is required (medical clearance vs. Pharmacy clearance to hold med vs. Both)? both  4. Are there any medications that need to be held prior to surgery and how long? None listed, please advise if needed   5. Practice name and name of physician performing surgery? [facility] - [facility] - Dr Mack Guise    6. What is your office phone number [phone] ext. [phone]    7.   What is your office fax number [phone]  8.   Anesthesia type (None, local, MAC, general) ? General    James Carrillo 10/03/2019, 3:08 PM  _________________________________________________________________   (provider comments below)

## 2019-10-03 NOTE — Telephone Encounter (Signed)
   Primary Cardiologist: Kathlyn Sacramento, MD  Chart reviewed as part of pre-operative protocol coverage. Patient was contacted 10/03/2019 in reference to pre-operative risk assessment for pending surgery as outlined below.  James Carrillo was last seen on 07/10/2019 by Dr Fletcher Anon.    Left message for patient to call for preop clearance.   [name], PA-C 10/03/2019 4:16 PM

## 2019-10-03 NOTE — Telephone Encounter (Signed)
   Primary Cardiologist: Kathlyn Sacramento, MD  Chart reviewed as part of pre-operative protocol coverage. Patient was contacted 10/03/2019 in reference to pre-operative risk assessment for pending surgery as outlined below.  James Carrillo was last seen on 07/10/2019 by Dr Fletcher Anon.  Since that day, James Carrillo has done well. His limitations are because of his shoulder, no chest pain or SOB. DASI score 5.07 METs.   Therefore, based on ACC/AHA guidelines, the patient would be at acceptable risk for the planned procedure without further cardiovascular testing.   I will route this recommendation to the requesting party via Epic fax function and remove from pre-op pool.  Please call with questions.  [name], PA-C 10/03/2019, 4:52 PM

## 2019-10-07 ENCOUNTER — Encounter: Payer: Self-pay | Admitting: Internal Medicine

## 2019-10-07 ENCOUNTER — Other Ambulatory Visit: Payer: Self-pay | Admitting: Internal Medicine

## 2019-10-07 DIAGNOSIS — B029 Zoster without complications: Secondary | ICD-10-CM

## 2019-10-07 MED ORDER — VALACYCLOVIR HCL 1 G PO TABS: 1000.0000 mg | ORAL_TABLET | Freq: Three times a day (TID) | ORAL | 0 refills | Status: DC

## 2019-10-09 ENCOUNTER — Encounter: Payer: Self-pay | Admitting: Internal Medicine

## 2019-10-10 ENCOUNTER — Other Ambulatory Visit: Payer: Self-pay | Admitting: Orthopedic Surgery

## 2019-10-13 ENCOUNTER — Encounter: Payer: Self-pay | Admitting: Internal Medicine

## 2019-10-20 ENCOUNTER — Other Ambulatory Visit: Payer: Self-pay

## 2019-10-20 ENCOUNTER — Ambulatory Visit
Admission: RE | Admit: 2019-10-20 | Discharge: 2019-10-20 | Disposition: A | Discharge status: Home / Self Care | Payer: Medicare HMO | Source: Ambulatory Visit | Attending: Internal Medicine | Admitting: Internal Medicine

## 2019-10-20 DIAGNOSIS — I77819 Aortic ectasia, unspecified site: Secondary | ICD-10-CM | POA: Insufficient documentation

## 2019-10-20 LAB — POCT I-STAT CREATININE: Creatinine, Ser: 1 mg/dL (ref 0.61–1.24)

## 2019-10-20 MED ORDER — IOHEXOL 350 MG/ML SOLN
75.0000 mL | Freq: Once | INTRAVENOUS | Status: AC | PRN
  Administered 2019-10-20: 75 mL via INTRAVENOUS

## 2019-10-29 ENCOUNTER — Inpatient Hospital Stay: Admission: RE | Admit: 2019-10-29 | Payer: Medicare HMO | Source: Ambulatory Visit

## 2019-10-30 ENCOUNTER — Encounter
Admission: RE | Admit: 2019-10-30 | Discharge: 2019-10-30 | Disposition: A | Discharge status: Home / Self Care | Payer: Medicare HMO | Source: Ambulatory Visit | Attending: Orthopedic Surgery | Admitting: Orthopedic Surgery

## 2019-10-30 ENCOUNTER — Other Ambulatory Visit: Payer: Self-pay

## 2019-10-30 DIAGNOSIS — I493 Ventricular premature depolarization: Secondary | ICD-10-CM | POA: Insufficient documentation

## 2019-10-30 DIAGNOSIS — Z01818 Encounter for other preprocedural examination: Secondary | ICD-10-CM | POA: Insufficient documentation

## 2019-10-30 HISTORY — DX: Unspecified osteoarthritis, unspecified site: M19.90

## 2019-10-30 HISTORY — DX: Cardiac arrhythmia, unspecified: I49.9

## 2019-10-30 NOTE — Patient Instructions (Addendum)
Your procedure is scheduled on: 11-13-19 THURSDAY Report to Same Day Surgery 2nd floor medical mall [facility] Entrance-take elevator on left to 2nd floor.  Check in with surgery information desk.) To find out your arrival time please call [phone] between 1PM - 3PM on 11-12-19 [facility]  Remember: Instructions that are not followed completely may result in serious medical risk, up to and including death, or upon the discretion of your surgeon and anesthesiologist your surgery may need to be rescheduled.    _x___ 1. Do not eat food after midnight the night before your procedure. NO GUM OR CANDY AFTER MIDNIGHT. You may drink clear liquids up to 2 hours before you are scheduled to arrive at the hospital for your procedure.  Do not drink clear liquids within 2 hours of your scheduled arrival to the hospital.  Clear liquids include  --Water or Apple juice without pulp  --Gatorade  --Black Coffee or Clear Tea (No milk, no creamers, do not add anything to the coffee or Tea Type 1 and type 2 diabetics should only drink water.   ____Ensure clear carbohydrate drink on the way to the hospital for bariatric patients  ____Ensure clear carbohydrate drink 3 hours before surgery.    __x__ 2. No Alcohol for 24 hours before or after surgery.   __x__3. No Smoking or e-cigarettes for 24 prior to surgery.  Do not use any chewable tobacco products for at least 6 hour prior to surgery   ____  4. Bring all medications with you on the day of surgery if instructed.    __x__ 5. Notify your doctor if there is any change in your medical condition     (cold, fever, infections).    x___6. On the morning of surgery brush your teeth with toothpaste and water.  You may rinse your mouth with mouth wash if you wish.  Do not swallow any toothpaste or mouthwash.   Do not wear jewelry, make-up, hairpins, clips or nail polish.  Do not wear lotions, powders, or perfumes.   Do not shave 48 hours prior to surgery.  Men may shave face and neck.  Do not bring valuables to the hospital.    [facility] is not responsible for any belongings or valuables.               Contacts, dentures or bridgework may not be worn into surgery.  Leave your suitcase in the car. After surgery it may be brought to your room.  For patients admitted to the hospital, discharge time is determined by your treatment team.  _  Patients discharged the day of surgery will not be allowed to drive home.  You will need someone to drive you home and stay with you the night of your procedure.    Please read over the following fact sheets that you were given:   [facility] Preparing for Surgery   _x___ TAKE THE FOLLOWING MEDICATION THE MORNING OF SURGERY WITH A SMALL SIP OF WATER. These include:  1. METOPROLOL (TOPROL)  2.  3.  4.  5.  6.  ____Fleets enema or Magnesium Citrate as directed.   _x___ Use CHG Soap or sage wipes as directed on instruction sheet   ____ Use inhalers on the day of surgery and bring to hospital day of surgery  ____ Stop Metformin and Janumet 2 days prior to surgery.    ____ Take 1/2 of usual insulin dose the night before surgery and none on the morning surgery.  ____ Follow recommendations from Cardiologist, Pulmonologist or PCP regarding stopping Aspirin, Coumadin, Plavix ,Eliquis, Effient, or Pradaxa, and Pletal.  X____Stop Anti-inflammatories such as Advil, Aleve, Ibuprofen, Motrin, Naproxen, Naprosyn, Goodies powders or aspirin products 7 DAYS PRIOR TO SURGERY-OK to take Tylenol    _X___ Stop supplements until after surgery-STOP RED YEAST RICE, LYSINE AND SAW PALMETTO 7 DAYS PRIOR TO SURGERY-YOU MAY RESUME THESE AFTER YOUR SURGERY   ____ Bring C-Pap to the hospital.

## 2019-10-30 NOTE — Pre-Procedure Instructions (Signed)
Wellington Hampshire, MD  Physician  Cardiology     Progress Notes  Signed     Encounter Date:  07/10/2019                  Signed          Expand AllCollapse All            Expand widget buttonCollapse widget button    Show:Clear all   ManualTemplateCopied  Added by:     Wellington Hampshire, MD   Hover for detailscustomization button                                                                                                                                                   untitled image      Cardiology Office Note        Date:  07/10/2019      ID:  MCCOY PITZ, DOB [birth year], MRN [id number]     PCP:  McLean-Scocuzza, Nino Glow, MD           Cardiologist:   Kathlyn Sacramento, MD           Chief Complaint    Patient presents with    .   other            3-4 month follow up. Meds reviewed by the pt. verbally. "doing well."              History of Present Illness:  James Carrillo is a 69 y.o. male who is here today for follow-up regarding PVCs and mildly dilated aortic root.   He is not diabetic and has no history of hypertension.  He does have hyperlipidemia currently not on medications.  He is not a smoker and drinks only occasional beer.  He drinks 1 cup of coffee daily with no other caffeinated products.  He has no family history of premature coronary artery disease, sudden death or arrhythmia.  His father did have an MI at the age of 11 and his mother had valve replacement.     Was seen last year for symptomatic PVCs.  Outpatient monitor showed 36% burden.  I referred him to Dr. Caryl Comes and the patient was started on metoprolol with subsequent improvement in symptoms.  He is doing well with no chest pain, shortness of breath or syncope.  Lexiscan  Myoview in April of this year showed no evidence of ischemia with normal ejection fraction.             Past Medical History:    Diagnosis   Date    .   Atrial fibrillation ([place])            per pt intermittent since 2003     .   Claustrophobia        .  Multiple thyroid nodules        .   Palpitations        .   PVC's (premature ventricular contractions)        .   Rotator cuff tear            s/p surgery in 2000 now has tear per MRI ortho [facility] Dr. Edd Fabian last steroid shot (right)                Past Surgical History:    Procedure   Laterality   Date    .   KNEE SURGERY   Left        .   MOLE REMOVAL       2004        Dr. Collene Schlichter    .   SHOULDER SURGERY   Right   1999/2000    .   TONSILLECTOMY                1956                    Current Outpatient Medications    Medication   Sig   Dispense   Refill    .   ALPRAZolam (XANAX) 0.5 MG tablet   Take 1 tablet (0.5 mg total) by mouth as needed for anxiety. Take 15-30 minutes before imaging scan as needed,another prn if needed (Patient taking differently: Take 0.5 mg by mouth as needed for anxiety. Take 15-30 minutes before imaging scan or dental procedure as needed,another prn if needed)   15 tablet   0    .   Lysine 500 MG TABS   Take 1 tablet by mouth daily.            .   metoprolol tartrate (LOPRESSOR) 25 MG tablet   Take 1 tablet (25 mg total) by mouth 2 (two) times daily.   90 tablet   3    .   Multiple Vitamins-Minerals (MULTIVITAMIN ADULT PO)   Take 1 tablet by mouth daily.             .   Omega-3 Fatty Acids (FISH OIL) 1200 MG CAPS   Take 1 capsule by mouth 2 (two) times daily.            .   Red Yeast Rice 600 MG CAPS   Take 1 capsule by mouth 2 (two) times daily.            .   saw palmetto 160 MG capsule   Take 160 mg by mouth daily.              .   valACYclovir (VALTREX) 1000 MG tablet   Take 1 tablet (1,000 mg total) by mouth 3 (three) times daily.   21 tablet   0        No current facility-administered medications for this visit.           Allergies:   Other         Social History:  The patient  reports that he has never smoked. He has never used smokeless tobacco. He reports that he does not drink alcohol or use drugs.      Family History:  The patient's family history includes Arthritis in his father and mother; Cancer in his father; Heart disease in his father and mother; Melanoma in his father; Renal Disease in his paternal grandmother; Stroke in his mother.  ROS:  Please see the history of present illness.   Otherwise, review of systems are positive for none.   All other systems are reviewed and negative.         PHYSICAL EXAM:  VS:  BP 120/68 (BP Location: Left Arm, Patient Position: Sitting, Cuff Size: Normal)   Pulse (!) 57   Ht 5\' 8"  (1.727 m)   Wt 183 lb 8 oz (83.2 kg)   BMI 27.90 kg/m  , BMI Body mass index is 27.9 kg/m.  GEN: Well nourished, well developed, in no acute distress   HEENT: normal   Neck: no JVD, carotid bruits, or masses  Cardiac: RRR with  premature beats; no murmurs, rubs, or gallops,no edema   Respiratory:  clear to auscultation bilaterally, normal work of breathing  GI: soft, nontender, nondistended, + BS  MS: no deformity or atrophy   Skin: warm and dry, no rash  Neuro:  Strength and sensation are intact  Psych: euthymic mood, full affect        EKG:  EKG is not ordered today.           Recent Labs:  10/08/2018: Hemoglobin 15.5; Magnesium 2.0; Platelets 190; TSH 1.130  02/13/2019: ALT 21; BUN 18; Creatinine, Ser 0.97; Potassium 4.2; Sodium 139         Lipid Panel  Labs (Brief)                                                                                                                                   Wt Readings from Last 3 Encounters:    07/10/19   183 lb 8 oz (83.2 kg)    07/08/19   180 lb (81.6 kg)    05/29/19   180 lb (81.6 kg)                 PAD Screen   07/12/2018    Previous PAD dx?   No    Previous surgical procedure?   No    Pain with walking?   No    Feet/toe relief with dangling?   No    Painful, non-healing ulcers?   No    Extremities discolored?   No                ASSESSMENT AND PLAN:     1.  Symptomatic PVCs: Reasonably controlled with current dose of metoprolol.  Echocardiogram has shown normal LV systolic function.    I will consider referral to EP.     2.  Mildly dilated aortic root: Agree with yearly surveillance.        Disposition:   Continue follow-up with Dr. Caryl Comes as planned.  He can follow-up with me as needed.     Signed,     Kathlyn Sacramento, MD   07/10/2019 2:56 PM     [place]  Electronically signed by Wellington Hampshire, MD at 07/10/2019  3:30 PM             Office Visit on 07/10/2019               Detailed Report

## 2019-10-30 NOTE — Pre-Procedure Instructions (Signed)
Cardiac clearance in Epic and on chart

## 2019-11-03 ENCOUNTER — Other Ambulatory Visit: Payer: Self-pay

## 2019-11-03 ENCOUNTER — Encounter
Admission: RE | Admit: 2019-11-03 | Discharge: 2019-11-03 | Disposition: A | Discharge status: Home / Self Care | Payer: Medicare HMO | Source: Ambulatory Visit | Attending: Orthopedic Surgery | Admitting: Orthopedic Surgery

## 2019-11-03 DIAGNOSIS — Z01818 Encounter for other preprocedural examination: Secondary | ICD-10-CM | POA: Diagnosis not present

## 2019-11-03 DIAGNOSIS — I493 Ventricular premature depolarization: Secondary | ICD-10-CM | POA: Diagnosis not present

## 2019-11-03 LAB — BASIC METABOLIC PANEL
Anion gap: 7 (ref 5–15)
BUN: 23 mg/dL (ref 8–23)
CO2: 29 mmol/L (ref 22–32)
Calcium: 9.4 mg/dL (ref 8.9–10.3)
Chloride: 103 mmol/L (ref 98–111)
Creatinine, Ser: 0.9 mg/dL (ref 0.61–1.24)
GFR calc Af Amer: >60 mL/min (ref >60)
GFR calc non Af Amer: >60 mL/min (ref >60)
Glucose, Bld: 80 mg/dL (ref 70–99)
Potassium: 4.2 mmol/L (ref 3.5–5.1)
Sodium: 139 mmol/L (ref 135–145)

## 2019-11-03 LAB — CBC WITH DIFFERENTIAL/PLATELET
Abs Immature Granulocytes: 0.03 10*3/uL (ref 0.00–0.07)
Basophils Absolute: 0.1 10*3/uL (ref 0.0–0.1)
Basophils Relative: 1 %
Eosinophils Absolute: 0.2 10*3/uL (ref 0.0–0.5)
Eosinophils Relative: 2 %
HCT: 49.7 % (ref 39.0–52.0)
Hemoglobin: 16.9 g/dL (ref 13.0–17.0)
Immature Granulocytes: 0 %
Lymphocytes Relative: 29 %
Lymphs Abs: 2.5 10*3/uL (ref 0.7–4.0)
MCH: 32.4 pg (ref 26.0–34.0)
MCHC: 34 g/dL (ref 30.0–36.0)
MCV: 95.4 fL (ref 80.0–100.0)
Monocytes Absolute: 1.2 10*3/uL — ABNORMAL HIGH (ref 0.1–1.0)
Monocytes Relative: 14 %
Neutro Abs: 4.6 10*3/uL (ref 1.7–7.7)
Neutrophils Relative %: 54 %
Platelets: 176 10*3/uL (ref 150–400)
RBC: 5.21 MIL/uL (ref 4.22–5.81)
RDW: 13 % (ref 11.5–15.5)
WBC: 8.6 10*3/uL (ref 4.0–10.5)
nRBC: 0 % (ref 0.0–0.2)

## 2019-11-03 LAB — APTT: aPTT: 30 seconds (ref 24–36)

## 2019-11-03 LAB — PROTIME-INR
INR: 1 (ref 0.8–1.2)
Prothrombin Time: 12.8 seconds (ref 11.4–15.2)

## 2019-11-11 ENCOUNTER — Other Ambulatory Visit
Admission: RE | Admit: 2019-11-11 | Discharge: 2019-11-11 | Disposition: A | Discharge status: Home / Self Care | Payer: Medicare HMO | Source: Ambulatory Visit | Attending: Orthopedic Surgery | Admitting: Orthopedic Surgery

## 2019-11-11 DIAGNOSIS — Z20822 Contact with and (suspected) exposure to covid-19: Secondary | ICD-10-CM | POA: Diagnosis not present

## 2019-11-11 DIAGNOSIS — Z01812 Encounter for preprocedural laboratory examination: Secondary | ICD-10-CM | POA: Insufficient documentation

## 2019-11-11 LAB — SARS CORONAVIRUS 2 (TAT 6-24 HRS): SARS Coronavirus 2: NEGATIVE

## 2019-11-13 ENCOUNTER — Other Ambulatory Visit: Payer: Self-pay

## 2019-11-13 ENCOUNTER — Ambulatory Visit
Admission: RE | Admit: 2019-11-13 | Discharge: 2019-11-13 | Disposition: A | Discharge status: Home / Self Care | Payer: Medicare HMO | Attending: Orthopedic Surgery | Admitting: Orthopedic Surgery

## 2019-11-13 ENCOUNTER — Ambulatory Visit: Payer: Medicare HMO | Admitting: Anesthesiology

## 2019-11-13 ENCOUNTER — Encounter
Admission: RE | Disposition: A | Discharge status: Home / Self Care | Payer: Self-pay | Source: Home / Self Care | Attending: Orthopedic Surgery

## 2019-11-13 ENCOUNTER — Ambulatory Visit: Payer: Medicare HMO

## 2019-11-13 DIAGNOSIS — Z8261 Family history of arthritis: Secondary | ICD-10-CM | POA: Diagnosis not present

## 2019-11-13 DIAGNOSIS — M75122 Complete rotator cuff tear or rupture of left shoulder, not specified as traumatic: Secondary | ICD-10-CM | POA: Diagnosis present

## 2019-11-13 DIAGNOSIS — I493 Ventricular premature depolarization: Secondary | ICD-10-CM | POA: Diagnosis not present

## 2019-11-13 DIAGNOSIS — Z800 Family history of malignant neoplasm of digestive organs: Secondary | ICD-10-CM | POA: Diagnosis not present

## 2019-11-13 DIAGNOSIS — M1990 Unspecified osteoarthritis, unspecified site: Secondary | ICD-10-CM | POA: Diagnosis not present

## 2019-11-13 DIAGNOSIS — I4891 Unspecified atrial fibrillation: Secondary | ICD-10-CM | POA: Insufficient documentation

## 2019-11-13 DIAGNOSIS — Z419 Encounter for procedure for purposes other than remedying health state, unspecified: Secondary | ICD-10-CM

## 2019-11-13 DIAGNOSIS — Z888 Allergy status to other drugs, medicaments and biological substances status: Secondary | ICD-10-CM | POA: Insufficient documentation

## 2019-11-13 DIAGNOSIS — Z841 Family history of disorders of kidney and ureter: Secondary | ICD-10-CM | POA: Insufficient documentation

## 2019-11-13 DIAGNOSIS — Z91018 Allergy to other foods: Secondary | ICD-10-CM | POA: Diagnosis not present

## 2019-11-13 DIAGNOSIS — F40240 Claustrophobia: Secondary | ICD-10-CM | POA: Insufficient documentation

## 2019-11-13 DIAGNOSIS — F419 Anxiety disorder, unspecified: Secondary | ICD-10-CM | POA: Diagnosis not present

## 2019-11-13 DIAGNOSIS — Z823 Family history of stroke: Secondary | ICD-10-CM | POA: Diagnosis not present

## 2019-11-13 DIAGNOSIS — R002 Palpitations: Secondary | ICD-10-CM | POA: Insufficient documentation

## 2019-11-13 DIAGNOSIS — Z809 Family history of malignant neoplasm, unspecified: Secondary | ICD-10-CM | POA: Diagnosis not present

## 2019-11-13 DIAGNOSIS — E042 Nontoxic multinodular goiter: Secondary | ICD-10-CM | POA: Diagnosis not present

## 2019-11-13 SURGERY — SHOULDER ARTHROSCOPY WITH SUBACROMIAL DECOMPRESSION AND DISTAL CLAVICLE EXCISION
Anesthesia: General | Site: Shoulder | Laterality: Left

## 2019-11-13 MED ORDER — BUPIVACAINE HCL (PF) 0.5 % IJ SOLN
INTRAMUSCULAR | Status: DC | PRN
  Administered 2019-11-13 (×2): 5 mL

## 2019-11-13 MED ORDER — CEFAZOLIN SODIUM-DEXTROSE 2-4 GM/100ML-% IV SOLN
2.0000 g | INTRAVENOUS | Status: AC
  Administered 2019-11-13: 2 g via INTRAVENOUS

## 2019-11-13 MED ORDER — OXYCODONE HCL 5 MG PO TABS
ORAL_TABLET | ORAL | Status: AC
  Filled 2019-11-13: qty 1

## 2019-11-13 MED ORDER — FENTANYL CITRATE (PF) 100 MCG/2ML IJ SOLN
INTRAMUSCULAR | Status: AC
  Filled 2019-11-13: qty 2

## 2019-11-13 MED ORDER — PROPOFOL 10 MG/ML IV BOLUS
INTRAVENOUS | Status: AC
  Filled 2019-11-13: qty 20

## 2019-11-13 MED ORDER — OXYCODONE HCL 5 MG PO TABS
5.0000 mg | ORAL_TABLET | Freq: Once | ORAL | Status: AC | PRN
  Administered 2019-11-13: 5 mg via ORAL

## 2019-11-13 MED ORDER — OXYCODONE HCL 5 MG PO TABS: 5.0000 mg | ORAL_TABLET | ORAL | 0 refills | Status: DC | PRN

## 2019-11-13 MED ORDER — LIDOCAINE HCL (CARDIAC) PF 100 MG/5ML IV SOSY
PREFILLED_SYRINGE | INTRAVENOUS | Status: DC | PRN
  Administered 2019-11-13: 100 mg via INTRAVENOUS

## 2019-11-13 MED ORDER — BUPIVACAINE LIPOSOME 1.3 % IJ SUSP
INTRAMUSCULAR | Status: DC | PRN
  Administered 2019-11-13 (×4): 5 mL

## 2019-11-13 MED ORDER — DEXAMETHASONE SODIUM PHOSPHATE 10 MG/ML IJ SOLN
INTRAMUSCULAR | Status: AC
  Filled 2019-11-13: qty 1

## 2019-11-13 MED ORDER — FENTANYL CITRATE (PF) 100 MCG/2ML IJ SOLN
INTRAMUSCULAR | Status: AC
  Administered 2019-11-13: 50 ug via INTRAVENOUS
  Filled 2019-11-13: qty 2

## 2019-11-13 MED ORDER — FENTANYL CITRATE (PF) 100 MCG/2ML IJ SOLN
INTRAMUSCULAR | Status: DC | PRN
  Administered 2019-11-13: 25 ug via INTRAVENOUS
  Administered 2019-11-13: 50 ug via INTRAVENOUS

## 2019-11-13 MED ORDER — OXYCODONE HCL 5 MG/5ML PO SOLN: 5.0000 mg | Freq: Once | ORAL | Status: AC | PRN

## 2019-11-13 MED ORDER — SODIUM CHLORIDE 0.9 % IV SOLN
INTRAVENOUS | Status: DC | PRN
  Administered 2019-11-13: 35 ug/min via INTRAVENOUS

## 2019-11-13 MED ORDER — SUGAMMADEX SODIUM 200 MG/2ML IV SOLN
INTRAVENOUS | Status: DC | PRN
  Administered 2019-11-13: 200 mg via INTRAVENOUS

## 2019-11-13 MED ORDER — PROPOFOL 10 MG/ML IV BOLUS
INTRAVENOUS | Status: DC | PRN
  Administered 2019-11-13: 120 mg via INTRAVENOUS

## 2019-11-13 MED ORDER — VASOPRESSIN 20 UNIT/ML IV SOLN
INTRAVENOUS | Status: DC | PRN
  Administered 2019-11-13 (×4): .5 [IU] via INTRAVENOUS

## 2019-11-13 MED ORDER — FENTANYL CITRATE (PF) 100 MCG/2ML IJ SOLN: 50.0000 ug | Freq: Once | INTRAMUSCULAR | Status: AC

## 2019-11-13 MED ORDER — LIDOCAINE HCL (PF) 1 % IJ SOLN
INTRAMUSCULAR | Status: DC | PRN
  Administered 2019-11-13: 1 mL

## 2019-11-13 MED ORDER — FAMOTIDINE 20 MG PO TABS: 20.0000 mg | ORAL_TABLET | Freq: Once | ORAL | Status: AC

## 2019-11-13 MED ORDER — ROCURONIUM BROMIDE 10 MG/ML (PF) SYRINGE
PREFILLED_SYRINGE | INTRAVENOUS | Status: AC
  Filled 2019-11-13: qty 10

## 2019-11-13 MED ORDER — METOPROLOL TARTRATE 5 MG/5ML IV SOLN
INTRAVENOUS | Status: DC | PRN
  Administered 2019-11-13: 2.5 mg via INTRAVENOUS

## 2019-11-13 MED ORDER — CHLORHEXIDINE GLUCONATE CLOTH 2 % EX PADS: 6.0000 | MEDICATED_PAD | Freq: Once | CUTANEOUS | Status: DC

## 2019-11-13 MED ORDER — LIDOCAINE HCL (PF) 1 % IJ SOLN
INTRAMUSCULAR | Status: AC
  Filled 2019-11-13: qty 5

## 2019-11-13 MED ORDER — DEXAMETHASONE SODIUM PHOSPHATE 10 MG/ML IJ SOLN
INTRAMUSCULAR | Status: DC | PRN
  Administered 2019-11-13: 10 mg via INTRAVENOUS

## 2019-11-13 MED ORDER — ROCURONIUM BROMIDE 100 MG/10ML IV SOLN
INTRAVENOUS | Status: DC | PRN
  Administered 2019-11-13: 50 mg via INTRAVENOUS
  Administered 2019-11-13: 20 mg via INTRAVENOUS
  Administered 2019-11-13: 10 mg via INTRAVENOUS
  Administered 2019-11-13: 20 mg via INTRAVENOUS

## 2019-11-13 MED ORDER — VASOPRESSIN 20 UNIT/ML IV SOLN
INTRAVENOUS | Status: AC
  Filled 2019-11-13: qty 1

## 2019-11-13 MED ORDER — FAMOTIDINE 20 MG PO TABS
ORAL_TABLET | ORAL | Status: AC
  Administered 2019-11-13: 20 mg via ORAL
  Filled 2019-11-13: qty 1

## 2019-11-13 MED ORDER — DEXMEDETOMIDINE HCL IN NACL 200 MCG/50ML IV SOLN
INTRAVENOUS | Status: DC | PRN
  Administered 2019-11-13: 16 ug via INTRAVENOUS

## 2019-11-13 MED ORDER — LACTATED RINGERS IV SOLN: INTRAVENOUS | Status: DC

## 2019-11-13 MED ORDER — ACETAMINOPHEN 10 MG/ML IV SOLN
INTRAVENOUS | Status: DC | PRN
  Administered 2019-11-13: 1000 mg via INTRAVENOUS

## 2019-11-13 MED ORDER — ACETAMINOPHEN 10 MG/ML IV SOLN
INTRAVENOUS | Status: AC
  Filled 2019-11-13: qty 100

## 2019-11-13 MED ORDER — SODIUM CHLORIDE (PF) 0.9 % IJ SOLN
INTRAMUSCULAR | Status: AC
  Filled 2019-11-13: qty 20

## 2019-11-13 MED ORDER — BUPIVACAINE LIPOSOME 1.3 % IJ SUSP
INTRAMUSCULAR | Status: AC
  Filled 2019-11-13: qty 20

## 2019-11-13 MED ORDER — MIDAZOLAM HCL 2 MG/2ML IJ SOLN
INTRAMUSCULAR | Status: AC
  Filled 2019-11-13: qty 2

## 2019-11-13 MED ORDER — BUPIVACAINE HCL (PF) 0.5 % IJ SOLN
INTRAMUSCULAR | Status: AC
  Filled 2019-11-13: qty 10

## 2019-11-13 MED ORDER — FENTANYL CITRATE (PF) 100 MCG/2ML IJ SOLN: 25.0000 ug | INTRAMUSCULAR | Status: DC | PRN

## 2019-11-13 MED ORDER — CEFAZOLIN SODIUM-DEXTROSE 2-4 GM/100ML-% IV SOLN
INTRAVENOUS | Status: AC
  Filled 2019-11-13: qty 100

## 2019-11-13 MED ORDER — LACTATED RINGERS IV SOLN
INTRAVENOUS | Status: DC | PRN
  Administered 2019-11-13: 4 mL

## 2019-11-13 MED ORDER — PHENYLEPHRINE HCL (PRESSORS) 10 MG/ML IV SOLN
INTRAVENOUS | Status: DC | PRN
  Administered 2019-11-13 (×2): 100 ug via INTRAVENOUS
  Administered 2019-11-13: 200 ug via INTRAVENOUS

## 2019-11-13 MED ORDER — ONDANSETRON HCL 4 MG PO TABS: 4.0000 mg | ORAL_TABLET | Freq: Three times a day (TID) | ORAL | 0 refills | Status: DC | PRN

## 2019-11-13 SURGICAL SUPPLY — 64 items
ADAPTER IRRIG TUBE 2 SPIKE SOL (ADAPTER) ×4 IMPLANT
ANCHOR ALL-SUT Q-FIX 2.8 (Anchor) ×4 IMPLANT
ANCHOR SUT 5.5 MULTIFIX (Orthopedic Implant) ×4 IMPLANT
BUR RADIUS 4.0X18.5 (BURR) ×2 IMPLANT
BUR RADIUS 5.5 (BURR) ×2 IMPLANT
CANNULA 5.75X7 CRYSTAL CLEAR (CANNULA) ×4 IMPLANT
CANNULA PARTIAL THREAD 2X7 (CANNULA) ×2 IMPLANT
CANNULA TWIST IN 8.25X9CM (CANNULA) IMPLANT
CONNECTOR PERFECT PASSER (CONNECTOR) ×2 IMPLANT
COOLER POLAR GLACIER W/PUMP (MISCELLANEOUS) ×2 IMPLANT
COVER WAND RF STERILE (DRAPES) ×2 IMPLANT
CRADLE LAMINECT ARM (MISCELLANEOUS) ×4 IMPLANT
DEVICE SUCT BLK HOLE OR FLOOR (MISCELLANEOUS) IMPLANT
DRAPE 3/4 80X56 (DRAPES) ×2 IMPLANT
DRAPE IMP U-DRAPE 54X76 (DRAPES) ×4 IMPLANT
DRAPE INCISE IOBAN 66X45 STRL (DRAPES) ×2 IMPLANT
DRAPE SPLIT 6X30 W/TAPE (DRAPES) ×4 IMPLANT
DRAPE U-SHAPE 47X51 STRL (DRAPES) IMPLANT
DURAPREP 26ML APPLICATOR (WOUND CARE) ×6 IMPLANT
ELECT REM PT RETURN 9FT ADLT (ELECTROSURGICAL) ×2
ELECTRODE REM PT RTRN 9FT ADLT (ELECTROSURGICAL) ×1 IMPLANT
GAUZE SPONGE 4X4 12PLY STRL (GAUZE/BANDAGES/DRESSINGS) ×2 IMPLANT
GAUZE XEROFORM 1X8 LF (GAUZE/BANDAGES/DRESSINGS) ×2 IMPLANT
GLOVE BIOGEL PI IND STRL 9 (GLOVE) ×1 IMPLANT
GLOVE BIOGEL PI INDICATOR 9 (GLOVE) ×1
GLOVE SURG 9.0 ORTHO LTXF (GLOVE) ×4 IMPLANT
GOWN STRL REUS TWL 2XL XL LVL4 (GOWN DISPOSABLE) ×2 IMPLANT
IV LACTATED RINGER IRRG 3000ML (IV SOLUTION) ×13
IV LR IRRIG 3000ML ARTHROMATIC (IV SOLUTION) ×13 IMPLANT
KIT INSERTION 2.9 PUSHLOCK (KITS) ×2 IMPLANT
KIT STABILIZATION SHOULDER (MISCELLANEOUS) ×2 IMPLANT
KIT SUTURE 2.8 Q-FIX DISP (MISCELLANEOUS) ×2 IMPLANT
KIT TURNOVER KIT A (KITS) ×2 IMPLANT
MANIFOLD NEPTUNE II (INSTRUMENTS) ×2 IMPLANT
MASK FACE SPIDER DISP (MASK) ×2 IMPLANT
MAT ABSORB  FLUID 56X50 GRAY (MISCELLANEOUS) ×2
MAT ABSORB FLUID 56X50 GRAY (MISCELLANEOUS) ×2 IMPLANT
NEEDLE HYPO 22GX1.5 SAFETY (NEEDLE) ×2 IMPLANT
PACK ARTHROSCOPY SHOULDER (MISCELLANEOUS) ×2 IMPLANT
PAD WRAPON POLAR SHDR XLG (MISCELLANEOUS) ×1 IMPLANT
PASSER SUT FIRSTPASS SELF (INSTRUMENTS) ×2 IMPLANT
SET TUBE SUCT SHAVER OUTFL 24K (TUBING) ×2 IMPLANT
SET TUBE TIP INTRA-ARTICULAR (MISCELLANEOUS) ×2 IMPLANT
SPONGE LAP 18X18 RF (DISPOSABLE) ×2 IMPLANT
STRAP SAFETY 5IN WIDE (MISCELLANEOUS) ×2 IMPLANT
STRIP CLOSURE SKIN 1/2X4 (GAUZE/BANDAGES/DRESSINGS) ×2 IMPLANT
SUT ETHILON 4-0 (SUTURE) ×2
SUT ETHILON 4-0 FS2 18XMFL BLK (SUTURE) ×2
SUT MNCRL 4-0 (SUTURE) ×1
SUT MNCRL 4-0 27XMFL (SUTURE) ×1
SUT PDS AB 0 CT1 27 (SUTURE) IMPLANT
SUT PERFECTPASSER WHITE CART (SUTURE) ×6 IMPLANT
SUT SMART STITCH CARTRIDGE (SUTURE) ×8 IMPLANT
SUT VIC AB 0 CT1 36 (SUTURE) IMPLANT
SUT VIC AB 2-0 CT2 27 (SUTURE) IMPLANT
SUTURE ETHLN 4-0 FS2 18XMF BLK (SUTURE) ×2 IMPLANT
SUTURE MNCRL 4-0 27XMF (SUTURE) ×1 IMPLANT
SUTURE TAPE FIBERLINK 1.3 LOOP (SUTURE) IMPLANT
SUTURETAPE FIBERLINK 1.3 LOOP (SUTURE)
TAPE MICROFOAM 4IN (TAPE) ×2 IMPLANT
TUBING ARTHRO INFLOW-ONLY STRL (TUBING) ×2 IMPLANT
TUBING CONNECTING 10 (TUBING) ×2 IMPLANT
WAND HAND CNTRL MULTIVAC 90 (MISCELLANEOUS) ×2 IMPLANT
WRAPON POLAR PAD SHDR XLG (MISCELLANEOUS) ×2

## 2019-11-13 NOTE — Anesthesia Postprocedure Evaluation (Signed)
Anesthesia Post Note  Patient: James Carrillo  Procedure(s) Performed: LEFT SHOULDER ARTHROSCOPIC SUBACROMIAL DECOMPRESSION, DISTAL CLAVICLE EXCISION, MINI OPEN ROTATOR CUFF REPAIR (Left Shoulder)  Patient location during evaluation: PACU Anesthesia Type: General Level of consciousness: awake and alert Pain management: pain level controlled Vital Signs Assessment: post-procedure vital signs reviewed and stable Respiratory status: spontaneous breathing, nonlabored ventilation and respiratory function stable Cardiovascular status: blood pressure returned to baseline and stable Postop Assessment: no apparent nausea or vomiting Anesthetic complications: no     Last Vitals:  Vitals:   11/13/19 1223 11/13/19 1240  BP:  113/67  Pulse:  (!) 39  Resp:  14  Temp:  (!) 35.9 C  SpO2: 94% 95%    Last Pain:  Vitals:   11/13/19 1240  TempSrc: Temporal  PainSc: [name]

## 2019-11-13 NOTE — Anesthesia Procedure Notes (Addendum)
Anesthesia Regional Block: Interscalene brachial plexus block   Pre-Anesthetic Checklist: ,, timeout performed, Correct Patient, Correct Site, Correct Laterality, Correct Procedure, Correct Position, site marked, Risks and benefits discussed,  Surgical consent,  Pre-op evaluation,  At surgeon's request and post-op pain management  Laterality: Left  Prep: chloraprep       Needles:  Injection technique: Single-shot  Needle Type: Stimiplex     Needle Length: 9cm  Needle Gauge: 21     Additional Needles:   Procedures:,,,, ultrasound used (permanent image in chart),,,,  Narrative:  Start time: 11/13/2019 7:35 AM End time: 11/13/2019 7:40 AM  Performed by: Personally  Anesthesiologist: Tera Mater, MD  Additional Notes: Risks and benefits of nerve block discussed with patient, including but not limited to risk of nerve injury, bleeding, infection, and failed block.  Patient expressed understanding and consented to block placement.   Functioning IV was confirmed and monitors were applied.  Sterile prep,hand hygiene and sterile gloves were used.  Minimal sedation used for procedure.  During the procedure, there was negative aspiration, negative paresthesia on injection, and dose was given in divided aliquots under ultrasound guidance.  Patient tolerated the procedure well with no immediate complications.

## 2019-11-13 NOTE — Transfer of Care (Signed)
Immediate Anesthesia Transfer of Care Note  Patient: James Carrillo  Procedure(s) Performed: Procedure(s): LEFT SHOULDER ARTHROSCOPIC SUBACROMIAL DECOMPRESSION, DISTAL CLAVICLE EXCISION, MINI OPEN ROTATOR CUFF REPAIR (Left)  Patient Location: PACU  Anesthesia Type:General  Level of Consciousness: sedated  Airway & Oxygen Therapy: Patient Spontanous Breathing and Patient connected to face mask oxygen  Post-op Assessment: Report given to RN and Post -op Vital signs reviewed and stable  Post vital signs: Reviewed and stable  Last Vitals:  Vitals:   11/13/19 0749 11/13/19 1138  BP: 130/79 104/68  Pulse: (!) 35 (!) 34  Resp:  15  Temp:  (!) 35.8 C  SpO2: [id number]    Complications: No apparent anesthesia complications

## 2019-11-13 NOTE — Anesthesia Preprocedure Evaluation (Signed)
Anesthesia Evaluation  Patient identified by MRN, date of birth, ID band Patient awake    Reviewed: Allergy & Precautions, H&P , NPO status , Patient's Chart, lab work & pertinent test results  Airway Mallampati: II  TM Distance: >3 FB Neck ROM: full    Dental  (+) Chipped   Pulmonary neg pulmonary ROS, neg shortness of breath, neg sleep apnea, neg COPD, neg recent URI,           Cardiovascular (-) angina+ dysrhythmias (frequent PVC's, on metoprolol)      Neuro/Psych Anxiety negative neurological ROS  negative psych ROS   GI/Hepatic negative GI ROS, Neg liver ROS,   Endo/Other  negative endocrine ROS  Renal/GU      Musculoskeletal   Abdominal   Peds  Hematology negative hematology ROS (+)   Anesthesia Other Findings Past Medical History: No date: Arthritis No date: Atrial fibrillation ([facility])     Comment:  per pt intermittent since 2003  No date: Claustrophobia No date: Dysrhythmia No date: Multiple thyroid nodules No date: Palpitations No date: PVC's (premature ventricular contractions) No date: Rotator cuff tear     Comment:  s/p surgery in 2000 now has tear per MRI ortho [facility].               Edd Fabian last steroid shot (right)  Past Surgical History: No date: KNEE SURGERY; Left 2004: MOLE REMOVAL     Comment:  Dr. Collene Schlichter 1999/2000: SHOULDER SURGERY; Right     Comment:  x1 No date: TONSILLECTOMY     Comment:  1956     Reproductive/Obstetrics negative OB ROS                             Anesthesia Physical Anesthesia Plan  ASA: II  Anesthesia Plan: General ETT   Post-op Pain Management: GA combined w/ Regional for post-op pain   Induction:   PONV Risk Score and Plan: Ondansetron, Dexamethasone, Midazolam and Treatment may vary due to age or medical condition  Airway Management Planned:   Additional Equipment:   Intra-op Plan:   Post-operative Plan:    Informed Consent: I have reviewed the patients History and Physical, chart, labs and discussed the procedure including the risks, benefits and alternatives for the proposed anesthesia with the patient or authorized representative who has indicated his/her understanding and acceptance.     Dental Advisory Given  Plan Discussed with: Anesthesiologist  Anesthesia Plan Comments: (Interscalene block with GETA)        Anesthesia Quick Evaluation

## 2019-11-13 NOTE — Op Note (Signed)
11/13/2019  11:48 AM  PATIENT:  James Carrillo  69 y.o. male  PRE-OPERATIVE DIAGNOSIS:  M75.122 complete rotator cuff tear or rupture of left shoulder, subacromial impingement and acromioclavicular arthrosis  POST-OPERATIVE DIAGNOSIS:  M75.122 complete rotator cuff tear or  rupture of left shoulder, subacromial impingement and acromioclavicular arthrosis  PROCEDURE:  Procedure(s): LEFT SHOULDER ARTHROSCOPIC SUBACROMIAL DECOMPRESSION, DISTAL CLAVICLE EXCISION, MINI OPEN ROTATOR CUFF REPAIR   SURGEON:  Surgeon(s) and Role:    Thornton Park, MD - Primary  ANESTHESIA:   general and paracervical block   PREOPERATIVE INDICATIONS:  James Carrillo is a  69 y.o. male with a diagnosis of M75.122 complete rotator cuff tear or rupture of left shoulder failed conservative treatment and elected for surgical management.    The risks benefits and alternatives were discussed with the patient preoperatively including but not limited to the risks of infection, bleeding, nerve injury, persistent pain or weakness, failure of the hardware, re-tear of the rotator cuff and the need for further surgery. Medical risks include DVT and pulmonary embolism, myocardial infarction, stroke, pneumonia, respiratory failure and death. Patient understood these risks and wished to proceed.  OPERATIVE IMPLANTS: ArthroCare multifix anchors x 2 & Smith and Nephew Q Fix anchors x 2  OPERATIVE FINDINGS: Large full-thickness and retracted rotator cuff tear involving the supra and infraspinatus, previous spontaneous rupture of the long head of the biceps tendon, fraying of the subscapularis without full-thickness tear, fraying of the superior labrum without tear, glenohumeral joint arthrosis  OPERATIVE PROCEDURE: The patient was met in the preoperative area.  A preop history and physical was performed at the bedside.  The left shoulder was signed with the word yes and my initials according the hospital's correct site of  surgery protocol.   History and physical was updated.  Patient underwent an interscalene block with Exparel by the anesthesia service in the preop area.  Patient was brought to the operating room where he underwent general anesthesia.  The patient was placed in a beachchair position.  A spider arm positioner was used for this case. Examination under anesthesia revealed no limitation of motion or instability with load shift testing. The patient had a negative sulcus sign.  Patient was prepped and draped in a sterile fashion. A timeout was performed to verify the patient's name, date of birth, medical record number, correct site of surgery and correct procedure to be performed there was also used to verify the patient received antibiotics that all appropriate instruments, implants and radiographs studies were available in the room. Once all in attendance were in agreement case began.  Bony landmarks were drawn out with a surgical marker along with proposed arthroscopy incisions.  An 11 blade was used to establish a posterior portal through which the arthroscope was placed in the glenohumeral joint. A full diagnostic examination of the shoulder was performed.  The superior labrum and biceps tendon stump were debrided with a 4.27m resector shaver blade.   A lateral portal was created under direct visualization using an 18-gauge spinal needle.  A 4.073mresector shaver blade was placed through the lateral portal and used to debride the frayed edges of the rotator cuff tear as well as to prepare the greater tuberosity for repair.  The greater tuberosity footprint was debrided until punctate bleeding was identified.  The arthroscope was then placed in the subacromial space. Extensive bursitis was encountered and debrided using a 4-0 resector shaver blade and a 90 ArthroCare wand from a lateral portal. A subacromial  decompression was also performed using a 5.5 mm resector shaver blade from the lateral portal.  Six  Perfect Pass suture were placed in the lateral border of the rotator cuff tear. All arthroscopic instruments were then removed and the mini-open portion of the procedure began.   A saber-type incision was made along the lateral border of the acromion. The deltoid muscle was identified and split in line with its fibers which allowed visualization of the rotator cuff. The Perfect Pass sutures previously placed in the lateral border of the rotator cuff were brought out through the deltoid split. Two [employer] anchor was placed at the articular margin of the humeral head with the greater tuberosity. The suture limbs of the Q Fix anchor were passed medially through the rotator cuff using a First Pass suture passer.   The six Perfect Pass sutures were then anchored to the greater tuberosity footprint using two Multifix anchors. These anchors were tensioned to allow for anatomic reduction of the rotator cuff to the greater tuberosity. The medial row sutures were then tied down using an arthroscopic knot tying technique.  Arthroscopic images of the repair were taken with the arthroscope both externally and from inside the glenohumeral joint.  All incisions were copiously irrigated. The deltoid fascia was repaired using a 0 Vicryl suture.  The subcutaneous tissue of all incisions were closed with a 2-0 Vicryl. Skin closure for the arthroscopic incisions was performed with 4-0 nylon. The skin edges of the saber incision was approximated with a running 4-0 undyed Monocryl.  A dry sterile dressing was applied.  The patient was placed in an abduction sling and a Polar Care was applied to the shoulder.  All sharp and it instrument counts were correct at the conclusion of the case. I was scrubbed and present for the entire case. I spoke with the patient's contact, Pam, postoperatively to let her know the case had gone without complication and the patient was stable in recovery room.

## 2019-11-13 NOTE — Anesthesia Procedure Notes (Signed)
Procedure Name: Intubation Date/Time: 11/13/2019 7:55 AM Performed by: Allean Found, CRNA Pre-anesthesia Checklist: Patient identified, Patient being monitored, Timeout performed, Emergency Drugs available and Suction available Patient Re-evaluated:Patient Re-evaluated prior to induction Oxygen Delivery Method: Circle system utilized Preoxygenation: Pre-oxygenation with 100% oxygen Induction Type: IV induction Ventilation: Mask ventilation without difficulty Laryngoscope Size: McGraph and 4 Grade View: Grade I Tube type: Oral Tube size: 7.5 mm Number of attempts: 1 Airway Equipment and Method: Stylet Placement Confirmation: ETT inserted through vocal cords under direct vision,  positive ETCO2 and breath sounds checked- equal and bilateral Secured at: 21 cm Tube secured with: Tape Dental Injury: Teeth and Oropharynx as per pre-operative assessment

## 2019-11-13 NOTE — Progress Notes (Signed)
Patient has a rash upper left chest and midline. Patient states it is what is left from shingles before. No open areas.

## 2019-11-13 NOTE — H&P (Signed)
PREOPERATIVE H&P  Chief Complaint: M75.122 complete rotator cuff tear, left shoulder HPI: James Carrillo is a 69 y.o. male who presents for preoperative history and physical with a diagnosis of M75.122 complete rotator cuff tear or rupture of left shoulder. Symptoms of pain, weakness and limited ROM are significantly impairing activities of daily living.  His MRI has demonstrated a full-thickness and retracted rotator cuff tear.  Patient has failed nonoperative management. He has agreed with surgical fixation of his rotator cuff tear.   Past Medical History:  Diagnosis Date  . Arthritis   . Atrial fibrillation ([place])    per pt intermittent since 2003   . Claustrophobia   . Dysrhythmia   . Multiple thyroid nodules   . Palpitations   . PVC's (premature ventricular contractions)   . Rotator cuff tear    s/p surgery in 2000 now has tear per MRI ortho [facility] Dr. Edd Fabian last steroid shot (right)   Past Surgical History:  Procedure Laterality Date  . KNEE SURGERY Left   . MOLE REMOVAL  2004   Dr. Collene Schlichter  . SHOULDER SURGERY Right 1999/2000   x1  . TONSILLECTOMY     1956   Social History   Socioeconomic History  . Marital status: Single    Spouse name: Not on file  . Number of children: Not on file  . Years of education: Not on file  . Highest education level: Not on file  Occupational History  . Not on file  Tobacco Use  . Smoking status: Never Smoker  . Smokeless tobacco: Never Used  Substance and Sexual Activity  . Alcohol use: Yes    Comment: socially  . Drug use: No  . Sexual activity: Yes    Comment: women   Other Topics Concern  . Not on file  Social History Narrative   Masters, professor    Single has adult kids sons    Social Determinants of Radio broadcast assistant Strain:   . Difficulty of Paying Living Expenses:   Food Insecurity:   . Worried About Charity fundraiser in the Last Year:   . Arboriculturist in the Last Year:   Transportation  Needs:   . Film/video editor (Medical):   Marland Kitchen Lack of Transportation (Non-Medical):   Physical Activity:   . Days of Exercise per Week:   . Minutes of Exercise per Session:   Stress:   . Feeling of Stress :   Social Connections:   . Frequency of Communication with Friends and Family:   . Frequency of Social Gatherings with Friends and Family:   . Attends Religious Services:   . Active Member of Clubs or Organizations:   . Attends Archivist Meetings:   Marland Kitchen Marital Status:    Family History  Problem Relation Age of Onset  . Melanoma Father        hand and arms  . Arthritis Father   . Cancer Father        throat dx age 35   . Heart disease Father        MI age 92 and CABG hx   . Arthritis Mother   . Stroke Mother        age 60  . Heart disease Mother        heart valve replaced older age   . Renal Disease Paternal Grandmother        [place]    Allergies  Allergen Reactions  .  Other Other (See Comments)    Avoid fluoroquinolones d/t aortic root dilatation   . Wheat Bran     WHOLE WHEAT   Prior to Admission medications   Medication Sig Start Date End Date Taking? Authorizing Provider  ibuprofen (ADVIL) 200 MG tablet Take 400 mg by mouth every 6 (six) hours as needed for headache or moderate pain.   Yes [provider]  Lysine 500 MG TABS Take 500 mg by mouth daily.    Yes [provider]  metoprolol tartrate (LOPRESSOR) 25 MG tablet Take 1 tablet (25 mg total) by mouth 2 (two) times daily. 06/02/19  Yes Dunn, Areta Haber, PA-C  Multiple Vitamins-Minerals (MULTIVITAMIN ADULT PO) Take 1 tablet by mouth daily.    Yes [provider]  Red Yeast Rice 600 MG CAPS Take 600 mg by mouth in the morning and at bedtime.    Yes [provider]  saw palmetto 160 MG capsule Take 160 mg by mouth in the morning and at bedtime.    Yes [provider]  valACYclovir (VALTREX) 1000 MG tablet Take 1 tablet (1,000 mg total) by mouth 3 (three)  times daily. X 10 -14 days Patient not taking: Reported on 10/23/2019 10/07/19   McLean-Scocuzza, Nino Glow, MD     Positive ROS: All other systems have been reviewed and were otherwise negative with the exception of those mentioned in the HPI and as above.  Physical Exam: General: Alert, no acute distress Cardiovascular: Regular rate and rhythm, no murmurs rubs or gallops.  No pedal edema Respiratory: Clear to auscultation bilaterally, no wheezes rales or rhonchi. No cyanosis, no use of accessory musculature GI: No organomegaly, abdomen is soft and non-tender nondistended with positive bowel sounds. Skin: Skin intact, no lesions within the operative field. Neurologic: Sensation intact distally Psychiatric: Patient is competent for consent with normal mood and affect Lymphatic: No cervical lymphadenopathy  MUSCULOSKELETAL: Left shoulder: Skin is intact.  There is no erythema ecchymosis swelling or deformity.  There is no obvious muscle atrophy.  Patient has full digital wrist and elbow range of motion, intact sensation light touch and a palpable radial pulse.  Patient can forward elevate and abduct to 1 140 to 150 degrees but has pain at approximately 90 degrees of forward elevation and abduction.  He has positive impingement signs but no apprehension or instability.  He demonstrates weakness of shoulder abduction and external rotation.   Assessment: M75.122 complete rotator cuff tear or rupture of left shoulder.  Plan: Plan for Procedure(s): LEFT SHOULDER ARTHROSCOPIC  SUBACROMIAL DECOMPRESSION AND DISTAL CLAVICLE EXCISION AND MINI-OPEN ROTATOR CUFF TEAR  I reviewed the details of the operation as well as the postoperative course with the patient.    I discussed the risks and benefits of surgery. The risks include but are not limited to infection, bleeding, nerve or blood vessel injury, joint stiffness or loss of motion, persistent pain, weakness or instability, retear of the rotator cuff,  hardware or repair failure and the need for further surgery. Medical risks include but are not limited to DVT and pulmonary embolism, myocardial infarction, stroke, pneumonia, respiratory failure and death. Patient understood these risks and wished to proceed.     Thornton Park, MD   11/13/2019 7:44 AM

## 2019-11-13 NOTE — Discharge Instructions (Signed)
AMBULATORY SURGERY  °DISCHARGE INSTRUCTIONS ° ° °1) The drugs that you were given will stay in your system until tomorrow so for the next 24 hours you should not: ° °A) Drive an automobile °B) Make any legal decisions °C) Drink any alcoholic beverage ° ° °2) You may resume regular meals tomorrow.  Today it is better to start with liquids and gradually work up to solid foods. ° °You may eat anything you prefer, but it is better to start with liquids, then soup and crackers, and gradually work up to solid foods. ° ° °3) Please notify your doctor immediately if you have any unusual bleeding, trouble breathing, redness and pain at the surgery site, drainage, fever, or pain not relieved by medication. ° ° ° °4) Additional Instructions: ° ° ° ° ° ° ° °Please contact your physician with any problems or Same Day Surgery at [phone], Monday through Friday 6 am to 4 pm, or [place] at [place] Main number at [phone]. °

## 2019-12-10 ENCOUNTER — Ambulatory Visit (INDEPENDENT_AMBULATORY_CARE_PROVIDER_SITE_OTHER): Payer: Medicare HMO | Admitting: Internal Medicine

## 2019-12-10 ENCOUNTER — Encounter: Payer: Self-pay | Admitting: Internal Medicine

## 2019-12-10 ENCOUNTER — Other Ambulatory Visit: Payer: Self-pay

## 2019-12-10 ENCOUNTER — Telehealth: Payer: Self-pay | Admitting: Internal Medicine

## 2019-12-10 VITALS — BP 110/84 | HR 58 | Temp 97.4°F | Ht 68.0 in | Wt 183.2 lb

## 2019-12-10 DIAGNOSIS — E041 Nontoxic single thyroid nodule: Secondary | ICD-10-CM

## 2019-12-10 DIAGNOSIS — Z125 Encounter for screening for malignant neoplasm of prostate: Secondary | ICD-10-CM

## 2019-12-10 DIAGNOSIS — E785 Hyperlipidemia, unspecified: Secondary | ICD-10-CM

## 2019-12-10 DIAGNOSIS — B029 Zoster without complications: Secondary | ICD-10-CM

## 2019-12-10 DIAGNOSIS — Z1329 Encounter for screening for other suspected endocrine disorder: Secondary | ICD-10-CM

## 2019-12-10 DIAGNOSIS — I493 Ventricular premature depolarization: Secondary | ICD-10-CM

## 2019-12-10 DIAGNOSIS — Z1389 Encounter for screening for other disorder: Secondary | ICD-10-CM

## 2019-12-10 DIAGNOSIS — M25512 Pain in left shoulder: Secondary | ICD-10-CM | POA: Diagnosis not present

## 2019-12-10 DIAGNOSIS — R7303 Prediabetes: Secondary | ICD-10-CM

## 2019-12-10 NOTE — Telephone Encounter (Signed)
I left vm for pt to call ofc to sch US thyroid

## 2019-12-10 NOTE — Patient Instructions (Addendum)
Thyroid Nodule  A thyroid nodule is an isolated growth of thyroid cells that forms a lump in your thyroid gland. The thyroid gland is a butterfly-shaped gland. It is found in the lower front of your neck. This gland sends chemical messengers (hormones) through your blood to all parts of your body. These hormones are important in regulating your body temperature and helping your body to use energy. Thyroid nodules are common. Most are not cancerous (benign). You may have one nodule or several nodules. Different types of thyroid nodules include nodules that:  Grow and fill with fluid (thyroid cysts).  Produce too much thyroid hormone (hot nodules or hyperthyroid).  Produce no thyroid hormone (cold nodules or hypothyroid).  Form from cancer cells (thyroid cancers). What are the causes? In most cases, the cause of this condition is not known. What increases the risk? The following factors may make you more likely to develop this condition.  Age. Thyroid nodules become more common in people who are older than 71 years of age.  Gender. ? Benign thyroid nodules are more common in women. ? Cancerous (malignant) thyroid nodules are more common in men.  A family history that includes: ? Thyroid nodules. ? Pheochromocytoma. ? Thyroid carcinoma. ? Hyperparathyroidism.  Certain kinds of thyroid diseases, such as Hashimoto's thyroiditis.  Lack of iodine in your diet.  A history of head and neck radiation, such as from previous cancer treatment. What are the signs or symptoms? In many cases, there are no symptoms. If you have symptoms, they may include:  A lump in your lower neck.  Feeling a lump or tickle in your throat.  Pain in your neck, jaw, or ear.  Having trouble swallowing. Hot nodules may cause symptoms that include:  Weight loss.  Warm, flushed skin.  Feeling hot.  Feeling nervous.  A racing heartbeat. Cold nodules may cause symptoms that include:  Weight  gain.  Dry skin.  Brittle hair. This may also occur with hair loss.  Feeling cold.  Fatigue. Thyroid cancer nodules may cause symptoms that include:  Hard nodules that feel stuck to the thyroid gland.  Hoarseness.  Lumps in the glands near your thyroid (lymph nodes). How is this diagnosed? A thyroid nodule may be felt by your health care provider during a physical exam. This condition may also be diagnosed based on your symptoms. You may also have tests, including:  An ultrasound. This may be done to confirm the diagnosis.  A biopsy. This involves taking a sample from the nodule and looking at it under a microscope.  Blood tests to make sure that your thyroid is working properly.  A thyroid scan. This test uses a radioactive tracer injected into a vein to create an image of the thyroid gland on a computer screen.  Imaging tests such as MRI or CT scan. These may be done if: ? Your nodule is large. ? Your nodule is blocking your airway. ? Cancer is suspected. How is this treated? Treatment depends on the cause and size of your nodule or nodules. If the nodule is benign, treatment may not be necessary. Your health care provider may monitor the nodule to see if it goes away without treatment. If the nodule continues to grow, is cancerous, or does not go away, treatment may be needed. Treatment may include:  Having a cystic nodule drained with a needle.  Ablation therapy. In this treatment, alcohol is injected into the area of the nodule to destroy the cells. Ablation with heat (  thermal ablation) may also be used.  Radioactive iodine. In this treatment, radioactive iodine is given as a pill or liquid that you drink. This substance causes the thyroid nodule to shrink.  Surgery to remove the nodule. Part or all of your thyroid gland may need to be removed as well.  Medicines. Follow these instructions at home:  Pay attention to any changes in your nodule.  Take  over-the-counter and prescription medicines only as told by your health care provider.  Keep all follow-up visits as told by your health care provider. This is important. Contact a health care provider if:  Your voice changes.  You have trouble swallowing.  You have pain in your neck, ear, or jaw that is getting worse.  Your nodule gets bigger.  Your nodule starts to make it harder for you to breathe.  Your muscles look like they are shrinking (muscle wasting). Get help right away if:  You have chest pain.  There is a loss of consciousness.  You have a sudden fever.  You feel confused.  You are seeing or hearing things that other people do not see or hear (having hallucinations).  You feel very weak.  You have mood swings.  You feel very restless.  You feel suddenly nauseous or throw up.  You suddenly have diarrhea. Summary  A thyroid nodule is an isolated growth of thyroid cells that forms a lump in your thyroid gland.  Thyroid nodules are common. Most are not cancerous (benign). You may have one nodule or several nodules.  Treatment depends on the cause and size of your nodule or nodules. If the nodule is benign, treatment may not be necessary.  Your health care provider may monitor the nodule to see if it goes away without treatment. If the nodule continues to grow, is cancerous, or does not go away, treatment may be needed. This information is not intended to replace advice given to you by your health care provider. Make sure you discuss any questions you have with your health care provider. Document Revised: 03/29/2018 Document Reviewed: 04/01/2018 Elsevier Patient Education  [place], which is also known as herpes zoster, is an infection that causes a painful skin rash and fluid-filled blisters. It is caused by a virus. Shingles only develops in people who:  Have had chickenpox.  Have been given a medicine to protect  against chickenpox (have been vaccinated). Shingles is rare in this group. What are the causes? Shingles is caused by varicella-zoster virus (VZV). This is the same virus that causes chickenpox. After a person is exposed to VZV, the virus stays in the body in an inactive (dormant) state. Shingles develops if the virus is reactivated. This can happen many years after the first (initial) exposure to VZV. It is not known what causes this virus to be reactivated. What increases the risk? People who have had chickenpox or received the chickenpox vaccine are at risk for shingles. Shingles infection is more common in people who:  Are older than age 74.  Have a weakened disease-fighting system (immune system), such as people with: ? HIV. ? AIDS. ? Cancer.  Are taking medicines that weaken the immune system, such as transplant medicines.  Are experiencing a lot of stress. What are the signs or symptoms? Early symptoms of this condition include itching, tingling, and pain in an area on your skin. Pain may be described as burning, stabbing, or throbbing. A few days or weeks after early symptoms start,  a painful red rash appears. The rash is usually on one side of the body and has a band-like or belt-like pattern. The rash eventually turns into fluid-filled blisters that break open, change into scabs, and dry up in about 2-3 weeks. At any time during the infection, you may also develop:  A fever.  Chills.  A headache.  An upset stomach. How is this diagnosed? This condition is diagnosed with a skin exam. Skin or fluid samples may be taken from the blisters before a diagnosis is made. These samples are examined under a microscope or sent to a lab for testing. How is this treated? The rash may last for several weeks. There is not a specific cure for this condition. Your health care provider will probably prescribe medicines to help you manage pain, recover more quickly, and avoid long-term problems.  Medicines may include:  Antiviral drugs.  Anti-inflammatory drugs.  Pain medicines.  Anti-itching medicines (antihistamines). If the area involved is on your face, you may be referred to a specialist, such as an eye doctor (ophthalmologist) or an ear, nose, and throat (ENT) doctor (otolaryngologist) to help you avoid eye problems, chronic pain, or disability. Follow these instructions at home: Medicines  Take over-the-counter and prescription medicines only as told by your health care provider.  Apply an anti-itch cream or numbing cream to the affected area as told by your health care provider. Relieving itching and discomfort   Apply cold, wet cloths (cold compresses) to the area of the rash or blisters as told by your health care provider.  Cool baths can be soothing. Try adding baking soda or dry oatmeal to the water to reduce itching. Do not bathe in hot water. Blister and rash care  Keep your rash covered with a loose bandage (dressing). Wear loose-fitting clothing to help ease the pain of material rubbing against the rash.  Keep your rash and blisters clean by washing the area with mild soap and cool water as told by your health care provider.  Check your rash every day for signs of infection. Check for: ? More redness, swelling, or pain. ? Fluid or blood. ? Warmth. ? Pus or a bad smell.  Do not scratch your rash or pick at your blisters. To help avoid scratching: ? Keep your fingernails clean and cut short. ? Wear gloves or mittens while you sleep, if scratching is a problem. General instructions  Rest as told by your health care provider.  Keep all follow-up visits as told by your health care provider. This is important.  Wash your hands often with soap and water. If soap and water are not available, use hand sanitizer. Doing this lowers your chance of getting a bacterial skin infection.  Before your blisters change into scabs, your shingles infection can cause  chickenpox in people who have never had it or have never been vaccinated against it. To prevent this from happening, avoid contact with other people, especially: ? Babies. ? Pregnant women. ? Children who have eczema. ? Elderly people who have transplants. ? People who have chronic illnesses, such as cancer or AIDS. Contact a health care provider if:  Your pain is not relieved with prescribed medicines.  Your pain does not get better after the rash heals.  You have signs of infection in the rash area, such as: ? More redness, swelling, or pain around the rash. ? Fluid or blood coming from the rash. ? The rash area feeling warm to the touch. ? Pus or  a bad smell coming from the rash. Get help right away if:  The rash is on your face or nose.  You have facial pain, pain around your eye area, or loss of feeling on one side of your face.  You have difficulty seeing.  You have ear pain or have ringing in your ear.  You have a loss of taste.  Your condition gets worse. Summary  Shingles, which is also known as herpes zoster, is an infection that causes a painful skin rash and fluid-filled blisters.  This condition is diagnosed with a skin exam. Skin or fluid samples may be taken from the blisters and examined before the diagnosis is made.  Keep your rash covered with a loose bandage (dressing). Wear loose-fitting clothing to help ease the pain of material rubbing against the rash.  Before your blisters change into scabs, your shingles infection can cause chickenpox in people who have never had it or have never been vaccinated against it. This information is not intended to replace advice given to you by your health care provider. Make sure you discuss any questions you have with your health care provider. Document Revised: 12/06/2018 Document Reviewed: 04/18/2017 Elsevier Patient Education  2020 [employer].

## 2019-12-10 NOTE — Telephone Encounter (Signed)
Documented

## 2019-12-10 NOTE — Progress Notes (Signed)
Chief Complaint  Patient presents with  . Follow-up   F/u  1. pvcs w/o sx's on metop 25 mg bid doing well  2. Shingles neck/upper back resolved he had x 3 months since 06/30/20  3. Thyroid nodules due [place] 11/2019 wants to wait to repeat left thyroid noudles    Review of Systems  Constitutional: Negative for weight loss.  HENT: Negative for hearing loss.   Eyes: Negative for blurred vision.  Respiratory: Negative for shortness of breath.   Cardiovascular: Negative for chest pain.  Gastrointestinal: Negative for abdominal pain.  Musculoskeletal: Positive for joint pain.       Left shoulder pain   Skin: Negative for rash.  Neurological: Negative for headaches.  Psychiatric/Behavioral: Negative for memory loss.   Past Medical History:  Diagnosis Date  . Arthritis   . Atrial fibrillation ([place])    per pt intermittent since 2003   . Claustrophobia   . Dysrhythmia   . Multiple thyroid nodules   . Palpitations   . PVC's (premature ventricular contractions)   . Rotator cuff tear    s/p surgery in 2000 now has tear per MRI ortho [facility] Dr. Edd Fabian last steroid shot (right)   Past Surgical History:  Procedure Laterality Date  . KNEE SURGERY Left   . left shoulder surgery     2021 [facility] Dr. Raliegh Ip   . MOLE REMOVAL  2004   Dr. Collene Schlichter  . SHOULDER SURGERY Right 1999/2000   x1  . TONSILLECTOMY     1956   Family History  Problem Relation Age of Onset  . Melanoma Father        hand and arms  . Arthritis Father   . Cancer Father        throat dx age 28   . Heart disease Father        MI age 33 and CABG hx   . Arthritis Mother   . Stroke Mother        age 25  . Heart disease Mother        heart valve replaced older age   . Renal Disease Paternal Grandmother        [place]    Social History   Socioeconomic History  . Marital status: Single    Spouse name: Not on file  . Number of children: Not on file  . Years of education: Not on file  . Highest education level:  Not on file  Occupational History  . Not on file  Tobacco Use  . Smoking status: Never Smoker  . Smokeless tobacco: Never Used  Substance and Sexual Activity  . Alcohol use: Yes    Comment: socially  . Drug use: No  . Sexual activity: Yes    Comment: women   Other Topics Concern  . Not on file  Social History Narrative   Masters, professor    Single has adult kids sons    Social Determinants of Radio broadcast assistant Strain:   . Difficulty of Paying Living Expenses:   Food Insecurity:   . Worried About Charity fundraiser in the Last Year:   . Arboriculturist in the Last Year:   Transportation Needs:   . Film/video editor (Medical):   Marland Kitchen Lack of Transportation (Non-Medical):   Physical Activity:   . Days of Exercise per Week:   . Minutes of Exercise per Session:   Stress:   . Feeling of Stress :  Social Connections:   . Frequency of Communication with Friends and Family:   . Frequency of Social Gatherings with Friends and Family:   . Attends Religious Services:   . Active Member of Clubs or Organizations:   . Attends Archivist Meetings:   Marland Kitchen Marital Status:   Intimate Partner Violence:   . Fear of Current or Ex-Partner:   . Emotionally Abused:   Marland Kitchen Physically Abused:   . Sexually Abused:    Current Meds  Medication Sig  . ibuprofen (ADVIL) 200 MG tablet Take 400 mg by mouth every 6 (six) hours as needed for headache or moderate pain.  Marland Kitchen Lysine 500 MG TABS Take 500 mg by mouth daily.   . metoprolol tartrate (LOPRESSOR) 25 MG tablet Take 1 tablet (25 mg total) by mouth 2 (two) times daily.  . Multiple Vitamins-Minerals (MULTIVITAMIN ADULT PO) Take 1 tablet by mouth daily.   . Red Yeast Rice 600 MG CAPS Take 600 mg by mouth in the morning and at bedtime.   . saw palmetto 160 MG capsule Take 160 mg by mouth in the morning and at bedtime.    Allergies  Allergen Reactions  . Other Other (See Comments)    Avoid fluoroquinolones d/t aortic root  dilatation   . Wheat Bran     WHOLE WHEAT   Recent Results (from the past 2160 hour(s))  I-STAT creatinine     Status: None   Collection Time: 10/20/19  3:06 PM  Result Value Ref Range   Creatinine, Ser 1.00 0.61 - 1.24 mg/dL  APTT     Status: None   Collection Time: 11/03/19 10:01 AM  Result Value Ref Range   aPTT 30 24 - 36 seconds    Comment: Performed at [facility], [address] [place], [place] [id number]  Basic metabolic panel     Status: None   Collection Time: 11/03/19 10:01 AM  Result Value Ref Range   Sodium 139 135 - 145 mmol/L   Potassium 4.2 3.5 - 5.1 mmol/L   Chloride 103 98 - 111 mmol/L   CO2 29 22 - 32 mmol/L   Glucose, Bld 80 70 - 99 mg/dL    Comment: Glucose reference range applies only to samples taken after fasting for at least 8 hours.   BUN 23 8 - 23 mg/dL   Creatinine, Ser 0.90 0.61 - 1.24 mg/dL   Calcium 9.4 8.9 - 10.3 mg/dL   GFR calc non Af Amer >60 >60 mL/min   GFR calc Af Amer >60 >60 mL/min   Anion gap 7 5 - 15    Comment: Performed at [facility], [address] [place], [place] [address]  CBC WITH DIFFERENTIAL     Status: Abnormal   Collection Time: 11/03/19 10:01 AM  Result Value Ref Range   WBC 8.6 4.0 - 10.5 K/uL   RBC 5.21 4.22 - 5.81 MIL/uL   Hemoglobin 16.9 13.0 - 17.0 g/dL   HCT 49.7 39.0 - 52.0 %   MCV 95.4 80.0 - 100.0 fL   MCH 32.4 26.0 - 34.0 pg   MCHC 34.0 30.0 - 36.0 g/dL   RDW 13.0 11.5 - 15.5 %   Platelets 176 150 - 400 K/uL   nRBC 0.0 0.0 - 0.2 %   Neutrophils Relative % 54 %   Neutro Abs 4.6 1.7 - 7.7 K/uL   Lymphocytes Relative 29 %   Lymphs Abs 2.5 0.7 - 4.0 K/uL   Monocytes Relative  14 %   Monocytes Absolute 1.2 (H) 0.1 - 1.0 K/uL   Eosinophils Relative 2 %   Eosinophils Absolute 0.2 0.0 - 0.5 K/uL   Basophils Relative 1 %   Basophils Absolute 0.1 0.0 - 0.1 K/uL   Immature Granulocytes 0 %   Abs Immature Granulocytes 0.03 0.00 - 0.07 K/uL    Comment: Performed at [facility],  [address] [place], [place] [address]  Protime-INR     Status: None   Collection Time: 11/03/19 10:01 AM  Result Value Ref Range   Prothrombin Time 12.8 11.4 - 15.2 seconds   INR 1.0 0.8 - 1.2    Comment: (NOTE) INR goal varies based on device and disease states. Performed at [facility], [place], [place] [address]   SARS CORONAVIRUS 2 (TAT 6-24 HRS) Nasopharyngeal Nasopharyngeal Swab     Status: None   Collection Time: 11/11/19  9:35 AM   Specimen: Nasopharyngeal Swab  Result Value Ref Range   SARS Coronavirus 2 NEGATIVE NEGATIVE    Comment: (NOTE) SARS-CoV-2 target nucleic acids are NOT DETECTED. The SARS-CoV-2 RNA is generally detectable in upper and lower respiratory specimens during the acute phase of infection. Negative results do not preclude SARS-CoV-2 infection, do not rule out co-infections with other pathogens, and should not be used as the sole basis for treatment or other patient management decisions. Negative results must be combined with clinical observations, patient history, and epidemiological information. The expected result is Negative. Fact Sheet for Patients: SugarRoll.be Fact Sheet for Healthcare Providers: https://www.woods-mathews.com/ This test is not yet approved or cleared by the [place] FDA and  has been authorized for detection and/or diagnosis of SARS-CoV-2 by FDA under an Emergency Use Authorization (EUA). This EUA will remain  in effect (meaning this test can be used) for the duration of the COVID-19 declaration under Section 56 4(b)(1) of the Act, 21 U.S.C. section 360bbb-3(b)(1), unless the authorization is terminated or revoked sooner. Performed at [facility], [place] [address], [place], [place] [address]    Objective  Body mass index is 27.86 kg/m. Wt Readings from Last 3 Encounters:  12/10/19 183 lb 3.2 oz (83.1 kg)  08/05/19 183 lb (83 kg)  07/10/19  183 lb 8 oz (83.2 kg)   Temp Readings from Last 3 Encounters:  12/10/19 (!) 97.4 F (36.3 C) (Temporal)  11/13/19 (!) 96.6 F (35.9 C) (Temporal)  10/11/18 98.4 F (36.9 C) (Oral)   BP Readings from Last 3 Encounters:  12/10/19 110/84  11/13/19 113/67  07/10/19 120/68   Pulse Readings from Last 3 Encounters:  12/10/19 (!) 58  11/13/19 (!) 39  07/10/19 (!) 57    Physical Exam Vitals and nursing note reviewed.  Constitutional:      Appearance: Normal appearance. He is well-developed and well-groomed.  HENT:     Head: Normocephalic and atraumatic.  Eyes:     Conjunctiva/sclera: Conjunctivae normal.     Pupils: Pupils are equal, round, and reactive to light.  Cardiovascular:     Rate and Rhythm: Bradycardia present. Rhythm irregular.     Heart sounds: No murmur.     Comments: pvcs  Pulmonary:     Effort: Pulmonary effort is normal.     Breath sounds: Normal breath sounds.  Abdominal:     General: Abdomen is flat. Bowel sounds are normal.     Tenderness: There is no abdominal tenderness.  Skin:    General: Skin is warm and dry.  Neurological:     General: No focal deficit present.     Mental Status: He is alert and oriented to person, place, and time. Mental status is at baseline.     Gait: Gait normal.  Psychiatric:        Attention and Perception: Attention and perception normal.        Mood and Affect: Mood and affect normal.        Speech: Speech normal.        Behavior: Behavior normal. Behavior is cooperative.        Thought Content: Thought content normal.        Cognition and Memory: Cognition and memory normal.        Judgment: Judgment normal.     Assessment  Plan  Left thyroid nodule - Plan: US THYROID 05/2020 pt wants to do  PVC's (premature ventricular contractions) F/u cards   Herpes zoster without complication resolved  Left shoulder pain, unspecified chronicity S/p surgery Dr. Raliegh Ip  Doing well in PT/OT   HM utd flu -prevnar had  06/04/15, pna 23utd,shingles had 05/25/14(zostervax) -confirm with pharmacy and if not had shingrix had 1/2 logged check 2nd dose  Tdap utd4/9/19 hep B vaccine givenRx prevto get this covid 2/2   cologuard neg 08/05/18 normal Never smoker Hep C neg 04/2016  PSA6/18/20 1.08 check in 1 year  rec hep B vaccine 03/14/17 titer low <10. sAg and core total neg  [place] [facility] seen12[facility]10/2018 bx pending sch fasting labs fall 2021     Provider: Dr. Olivia Mackie McLean-Scocuzza-Internal Medicine

## 2019-12-11 ENCOUNTER — Telehealth: Payer: Self-pay | Admitting: Internal Medicine

## 2019-12-11 NOTE — Telephone Encounter (Signed)
-----   Message from Delorise Jackson, MD sent at 12/10/2019 11:16 AM EDT ----- Check shingrix vaccine for 2/2 doses at pharmacy or [email or web address]

## 2019-12-26 NOTE — Telephone Encounter (Signed)
According to [employer] and the pharmacy, patient has not had his 2nd dose. The pharmacy states they have this on file for him to get but it has not been done.   Left message to return call for patient.

## 2019-12-26 NOTE — Telephone Encounter (Signed)
Spoke with the patient and he states that insurance will not cover for him to have this vaccine. States he also had a bout of shingles for 3 months last fall of 2020.   Patient states he has not had his hepatitis vaccines but he will go to the pharmacy for this.   For your information

## 2020-01-19 ENCOUNTER — Encounter: Payer: Self-pay | Admitting: Internal Medicine

## 2020-01-31 ENCOUNTER — Other Ambulatory Visit: Payer: Self-pay | Admitting: Physician Assistant

## 2020-02-02 MED ORDER — METOPROLOL TARTRATE 25 MG PO TABS: 25.0000 mg | ORAL_TABLET | Freq: Two times a day (BID) | ORAL | 0 refills | Status: DC

## 2020-02-17 ENCOUNTER — Telehealth: Payer: Self-pay | Admitting: Internal Medicine

## 2020-02-17 NOTE — Telephone Encounter (Signed)
Left vm for pt to call ofc to sch [place].

## 2020-02-18 NOTE — Telephone Encounter (Signed)
Pt returned your call.  

## 2020-02-27 ENCOUNTER — Other Ambulatory Visit: Payer: Self-pay

## 2020-02-27 ENCOUNTER — Ambulatory Visit
Admission: RE | Admit: 2020-02-27 | Discharge: 2020-02-27 | Disposition: A | Discharge status: Home / Self Care | Payer: Medicare HMO | Source: Ambulatory Visit | Attending: Internal Medicine | Admitting: Internal Medicine

## 2020-02-27 DIAGNOSIS — E041 Nontoxic single thyroid nodule: Secondary | ICD-10-CM | POA: Insufficient documentation

## 2020-03-12 ENCOUNTER — Ambulatory Visit: Payer: Medicare HMO

## 2020-04-11 ENCOUNTER — Encounter: Payer: Self-pay | Admitting: Internal Medicine

## 2020-04-12 ENCOUNTER — Other Ambulatory Visit: Payer: Self-pay | Admitting: Internal Medicine

## 2020-04-12 DIAGNOSIS — B029 Zoster without complications: Secondary | ICD-10-CM

## 2020-04-12 MED ORDER — VALACYCLOVIR HCL 1 G PO TABS: 1000.0000 mg | ORAL_TABLET | Freq: Three times a day (TID) | ORAL | 11 refills | Status: DC

## 2020-04-14 ENCOUNTER — Other Ambulatory Visit: Payer: Self-pay

## 2020-04-14 ENCOUNTER — Other Ambulatory Visit: Payer: Medicare HMO

## 2020-04-14 DIAGNOSIS — Z20822 Contact with and (suspected) exposure to covid-19: Secondary | ICD-10-CM

## 2020-04-15 LAB — NOVEL CORONAVIRUS, NAA: SARS-CoV-2, NAA: NOT DETECTED

## 2020-04-15 LAB — SARS-COV-2, NAA 2 DAY TAT

## 2020-06-01 ENCOUNTER — Ambulatory Visit (INDEPENDENT_AMBULATORY_CARE_PROVIDER_SITE_OTHER): Payer: Medicare HMO

## 2020-06-01 VITALS — Ht 68.0 in | Wt 183.0 lb

## 2020-06-01 DIAGNOSIS — Z0000 Encounter for general adult medical examination without abnormal findings: Secondary | ICD-10-CM

## 2020-06-01 NOTE — Patient Instructions (Addendum)
James Carrillo , Thank you for taking time to come for your Medicare Wellness Visit. I appreciate your ongoing commitment to your health goals. Please review the following plan we discussed and let me know if I can assist you in the future.   These are the goals we discussed: Goals    . Follow up with Primary Care Provider     As needed       This is a list of the screening recommended for you and due dates:  Health Maintenance  Topic Date Due  . Flu Shot  03/28/2020  . Tetanus Vaccine  12/05/2027  . COVID-19 Vaccine  Completed  .  Hepatitis C: One time screening is recommended by Center for Disease Control  (CDC) for  adults born from [birth year] through 1967.   Completed  . Pneumonia vaccines  Completed  . Colon Cancer Screening  Discontinued    Immunizations Immunization History  Administered Date(s) Administered  . Influenza,inj,quad, With Preservative 06/24/2018, 05/23/2019  . Influenza-Unspecified 06/18/2017, 06/02/2019  . Moderna SARS-COVID-2 Vaccination 10/07/2019, 11/04/2019  . Pneumococcal Polysaccharide-23 10/25/2018  . Pneumococcal-Unspecified 08/10/2017  . Tdap 12/04/2017  . Unspecified SARS-COV-2 Vaccination 10/06/2019  . Zoster Recombinat (Shingrix) 05/25/2014, 04/12/2020   Advanced directives: yes, on file  Conditions/risks identified: none new  Follow up in one year for your annual wellness visit.   Preventive Care 71 Years and Older, Male Preventive care refers to lifestyle choices and visits with your health care provider that can promote health and wellness. What does preventive care include?  A yearly physical exam. This is also called an annual well check.  Dental exams once or twice a year.  Routine eye exams. Ask your health care provider how often you should have your eyes checked.  Personal lifestyle choices, including:  Daily care of your teeth and gums.  Regular physical activity.  Eating a healthy diet.  Avoiding tobacco and drug  use.  Limiting alcohol use.  Practicing safe sex.  Taking low doses of aspirin every day.  Taking vitamin and mineral supplements as recommended by your health care provider. What happens during an annual well check? The services and screenings done by your health care provider during your annual well check will depend on your age, overall health, lifestyle risk factors, and family history of disease. Counseling  Your health care provider may ask you questions about your:  Alcohol use.  Tobacco use.  Drug use.  Emotional well-being.  Home and relationship well-being.  Sexual activity.  Eating habits.  History of falls.  Memory and ability to understand (cognition).  Work and work Statistician. Screening  You may have the following tests or measurements:  Height, weight, and BMI.  Blood pressure.  Lipid and cholesterol levels. These may be checked every 5 years, or more frequently if you are over 45 years old.  Skin check.  Lung cancer screening. You may have this screening every year starting at age 35 if you have a 30-pack-year history of smoking and currently smoke or have quit within the past 15 years.  Fecal occult blood test (FOBT) of the stool. You may have this test every year starting at age 50.  Flexible sigmoidoscopy or colonoscopy. You may have a sigmoidoscopy every 5 years or a colonoscopy every 10 years starting at age 62.  Prostate cancer screening. Recommendations will vary depending on your family history and other risks.  Hepatitis C blood test.  Hepatitis B blood test.  Sexually transmitted disease (STD) testing.  Diabetes screening. This is done by checking your blood sugar (glucose) after you have not eaten for a while (fasting). You may have this done every 1-3 years.  Abdominal aortic aneurysm (AAA) screening. You may need this if you are a current or former smoker.  Osteoporosis. You may be screened starting at age 44 if you are at  high risk. Talk with your health care provider about your test results, treatment options, and if necessary, the need for more tests. Vaccines  Your health care provider may recommend certain vaccines, such as:  Influenza vaccine. This is recommended every year.  Tetanus, diphtheria, and acellular pertussis (Tdap, Td) vaccine. You may need a Td booster every 10 years.  Zoster vaccine. You may need this after age 70.  Pneumococcal 13-valent conjugate (PCV13) vaccine. One dose is recommended after age 16.  Pneumococcal polysaccharide (PPSV23) vaccine. One dose is recommended after age 45. Talk to your health care provider about which screenings and vaccines you need and how often you need them. This information is not intended to replace advice given to you by your health care provider. Make sure you discuss any questions you have with your health care provider. Document Released: 09/10/2015 Document Revised: 05/03/2016 Document Reviewed: 06/15/2015 Elsevier Interactive Patient Education  2017 [place] Prevention in the Home Falls can cause injuries. They can happen to people of all ages. There are many things you can do to make your home safe and to help prevent falls. What can I do on the outside of my home?  Regularly fix the edges of walkways and driveways and fix any cracks.  Remove anything that might make you trip as you walk through a door, such as a raised step or threshold.  Trim any bushes or trees on the path to your home.  Use bright outdoor lighting.  Clear any walking paths of anything that might make someone trip, such as rocks or tools.  Regularly check to see if handrails are loose or broken. Make sure that both sides of any steps have handrails.  Any raised decks and porches should have guardrails on the edges.  Have any leaves, snow, or ice cleared regularly.  Use sand or salt on walking paths during winter.  Clean up any spills in your garage  right away. This includes oil or grease spills. What can I do in the bathroom?  Use night lights.  Install grab bars by the toilet and in the tub and shower. Do not use towel bars as grab bars.  Use non-skid mats or decals in the tub or shower.  If you need to sit down in the shower, use a plastic, non-slip stool.  Keep the floor dry. Clean up any water that spills on the floor as soon as it happens.  Remove soap buildup in the tub or shower regularly.  Attach bath mats securely with double-sided non-slip rug tape.  Do not have throw rugs and other things on the floor that can make you trip. What can I do in the bedroom?  Use night lights.  Make sure that you have a light by your bed that is easy to reach.  Do not use any sheets or blankets that are too big for your bed. They should not hang down onto the floor.  Have a firm chair that has side arms. You can use this for support while you get dressed.  Do not have throw rugs and other things on the floor that can  make you trip. What can I do in the kitchen?  Clean up any spills right away.  Avoid walking on wet floors.  Keep items that you use a lot in easy-to-reach places.  If you need to reach something above you, use a strong step stool that has a grab bar.  Keep electrical cords out of the way.  Do not use floor polish or wax that makes floors slippery. If you must use wax, use non-skid floor wax.  Do not have throw rugs and other things on the floor that can make you trip. What can I do with my stairs?  Do not leave any items on the stairs.  Make sure that there are handrails on both sides of the stairs and use them. Fix handrails that are broken or loose. Make sure that handrails are as long as the stairways.  Check any carpeting to make sure that it is firmly attached to the stairs. Fix any carpet that is loose or worn.  Avoid having throw rugs at the top or bottom of the stairs. If you do have throw rugs,  attach them to the floor with carpet tape.  Make sure that you have a light switch at the top of the stairs and the bottom of the stairs. If you do not have them, ask someone to add them for you. What else can I do to help prevent falls?  Wear shoes that:  Do not have high heels.  Have rubber bottoms.  Are comfortable and fit you well.  Are closed at the toe. Do not wear sandals.  If you use a stepladder:  Make sure that it is fully opened. Do not climb a closed stepladder.  Make sure that both sides of the stepladder are locked into place.  Ask someone to hold it for you, if possible.  Clearly mark and make sure that you can see:  Any grab bars or handrails.  First and last steps.  Where the edge of each step is.  Use tools that help you move around (mobility aids) if they are needed. These include:  Canes.  Walkers.  Scooters.  Crutches.  Turn on the lights when you go into a dark area. Replace any light bulbs as soon as they burn out.  Set up your furniture so you have a clear path. Avoid moving your furniture around.  If any of your floors are uneven, fix them.  If there are any pets around you, be aware of where they are.  Review your medicines with your doctor. Some medicines can make you feel dizzy. This can increase your chance of falling. Ask your doctor what other things that you can do to help prevent falls. This information is not intended to replace advice given to you by your health care provider. Make sure you discuss any questions you have with your health care provider. Document Released: 06/10/2009 Document Revised: 01/20/2016 Document Reviewed: 09/18/2014 Elsevier Interactive Patient Education  2017 [employer].

## 2020-06-01 NOTE — Progress Notes (Signed)
Subjective:   James Carrillo is a 69 y.o. male who presents for an Initial Medicare Annual Wellness Visit.  Review of Systems    No ROS.  Medicare Wellness Virtual Visit.       Objective:    Today's Vitals   06/01/20 1436  Weight: 183 lb (83 kg)  Height: 5\' 8"  (1.727 m)   Body mass index is 27.83 kg/m.  Advanced Directives 06/01/2020 11/13/2019 10/30/2019  Does Patient Have a Medical Advance Directive? Yes Yes Yes  Type of Paramedic of [place];Living will [place];Living will -  Does patient want to make changes to medical advance directive? No - Patient declined No - Patient declined -  Copy of [place] in Chart? Yes - validated most recent copy scanned in chart (See row information) Yes - validated most recent copy scanned in chart (See row information) -    Current Medications (verified) Outpatient Encounter Medications as of 06/01/2020  Medication Sig  . ibuprofen (ADVIL) 200 MG tablet Take 400 mg by mouth every 6 (six) hours as needed for headache or moderate pain.  Marland Kitchen Lysine 500 MG TABS Take 500 mg by mouth daily.   . metoprolol tartrate (LOPRESSOR) 25 MG tablet Take 1 tablet (25 mg total) by mouth 2 (two) times daily.  . Misc Natural Products (PROSTATE HEALTH PO) Take by mouth in the morning and at bedtime. Beta Prostate  . Multiple Vitamins-Minerals (MULTIVITAMIN ADULT PO) Take 1 tablet by mouth daily.   . Red Yeast Rice 600 MG CAPS Take 600 mg by mouth in the morning and at bedtime.   . saw palmetto 160 MG capsule Take 160 mg by mouth in the morning and at bedtime.   . valACYclovir (VALTREX) 1000 MG tablet Take 1 tablet (1,000 mg total) by mouth 3 (three) times daily. X 10 -14 days   No facility-administered encounter medications on file as of 06/01/2020.    Allergies (verified) Other and Wheat bran   History: Past Medical History:  Diagnosis Date  . Arthritis   . Atrial fibrillation ([place])     per pt intermittent since 2003   . Claustrophobia   . Dysrhythmia   . Multiple thyroid nodules   . Palpitations   . PVC's (premature ventricular contractions)   . Rotator cuff tear    s/p surgery in 2000 now has tear per MRI ortho [facility] Dr. Edd Fabian last steroid shot (right)   Past Surgical History:  Procedure Laterality Date  . KNEE SURGERY Left   . left shoulder surgery     2021 [facility] Dr. Raliegh Ip   . MOLE REMOVAL  2004   Dr. Collene Schlichter  . SHOULDER SURGERY Right 1999/2000   x1  . TONSILLECTOMY     1956   Family History  Problem Relation Age of Onset  . Melanoma Father        hand and arms  . Arthritis Father   . Cancer Father        throat dx age 46   . Heart disease Father        MI age 27 and CABG hx   . Arthritis Mother   . Stroke Mother        age 20  . Heart disease Mother        heart valve replaced older age   . Renal Disease Paternal Grandmother        [place]    Social History  Socioeconomic History  . Marital status: Single    Spouse name: Not on file  . Number of children: Not on file  . Years of education: Not on file  . Highest education level: Not on file  Occupational History  . Not on file  Tobacco Use  . Smoking status: Never Smoker  . Smokeless tobacco: Never Used  Vaping Use  . Vaping Use: Never used  Substance and Sexual Activity  . Alcohol use: Yes    Comment: socially  . Drug use: No  . Sexual activity: Yes    Comment: women   Other Topics Concern  . Not on file  Social History Narrative   Masters, professor    Single has adult kids sons    Social Determinants of Radio broadcast assistant Strain: Low Risk   . Difficulty of Paying Living Expenses: Not hard at all  Food Insecurity: No Food Insecurity  . Worried About Charity fundraiser in the Last Year: Never true  . Ran Out of Food in the Last Year: Never true  Transportation Needs: No Transportation Needs  . Lack of Transportation (Medical): No  . Lack of  Transportation (Non-Medical): No  Physical Activity:   . Days of Exercise per Week: Not on file  . Minutes of Exercise per Session: Not on file  Stress: No Stress Concern Present  . Feeling of Stress : Not at all  Social Connections:   . Frequency of Communication with Friends and Family: Not on file  . Frequency of Social Gatherings with Friends and Family: Not on file  . Attends Religious Services: Not on file  . Active Member of Clubs or Organizations: Not on file  . Attends Archivist Meetings: Not on file  . Marital Status: Not on file    Tobacco Counseling Counseling given: Not Answered   Clinical Intake:  Pre-visit preparation completed: Yes        Diabetes: No  How often do you need to have someone help you when you read instructions, pamphlets, or other written materials from your doctor or pharmacy?: 1 - Never  Interpreter Needed?: No      Activities of Daily Living In your present state of health, do you have any difficulty performing the following activities: 06/01/2020 10/30/2019  Hearing? N N  Vision? N N  Difficulty concentrating or making decisions? N N  Walking or climbing stairs? N N  Dressing or bathing? N N  Doing errands, shopping? N N  Preparing Food and eating ? N -  Using the Toilet? N -  In the past six months, have you accidently leaked urine? N -  Do you have problems with loss of bowel control? N -  Managing your Medications? N -  Managing your Finances? N -  Housekeeping or managing your Housekeeping? N -  Some recent data might be hidden    Patient Care Team: McLean-Scocuzza, Nino Glow, MD as PCP - General (Internal Medicine) Wellington Hampshire, MD as PCP - Cardiology (Cardiology)  Indicate any recent Medical Services you may have received from other than [facility] providers in the past year (date may be approximate).     Assessment:   This is a routine wellness examination for James Carrillo.  I connected with James Carrillo today by  telephone and verified that I am speaking with the correct person using two identifiers. Location patient: home Location provider: work Persons participating in the virtual visit: patient, Marine scientist.    I discussed  the limitations, risks, security and privacy concerns of performing an evaluation and management service by telephone and the availability of in person appointments. I also discussed with the patient that there may be a patient responsible charge related to this service. The patient expressed understanding and verbally consented to this telephonic visit.    Interactive audio and video telecommunications were attempted between this provider and patient, however failed, due to patient having technical difficulties OR patient did not have access to video capability.  We continued and completed visit with audio only.  Some vital signs may be absent or patient reported.   Hearing/Vision screen  Hearing Screening   125Hz  250Hz  500Hz  1000Hz  2000Hz  3000Hz  4000Hz  6000Hz  8000Hz   Right ear:           Left ear:           Comments: Patient is able to hear conversational tones without difficulty.  No issues reported.  Vision Screening Comments: Visual acuity not assessed, virtual visit.     Dietary issues and exercise activities discussed: Current Exercise Habits: Home exercise routine Healthy diet Good water intake  Goals    . Follow up with Primary Care Provider     As needed      Depression Screen PHQ 2/9 Scores 06/01/2020 12/10/2019 01/28/2019 07/11/2018  PHQ - 2 Score 0 1 0 0    Fall Risk Fall Risk  06/01/2020 12/10/2019 01/28/2019 07/11/2018 02/08/2018  Falls in the past year? 0 0 0 0 No  Number falls in past yr: 0 0 - - -  Injury with Fall? - 0 - - -  Follow up Falls evaluation completed Falls evaluation completed - - -   Handrails in use when climbing stairs? Yes Home free of loose throw rugs in walkways, pet beds, electrical cords, etc? Yes  Adequate lighting in your home to  reduce risk of falls? Yes   ASSISTIVE DEVICES UTILIZED TO PREVENT FALLS: Use of a cane, walker or w/c? No   TIMED UP AND GO: Was the test performed? No .  Virtual visit.   Cognitive Function:  Patient is alert and oriented x3.  Denies difficulty focusing, making decisions, memory loss.      Immunizations Immunization History  Administered Date(s) Administered  . Influenza,inj,quad, With Preservative 06/24/2018, 05/23/2019  . Influenza-Unspecified 06/18/2017, 06/02/2019  . Moderna SARS-COVID-2 Vaccination 10/07/2019, 11/04/2019  . Pneumococcal Polysaccharide-23 10/25/2018  . Pneumococcal-Unspecified 08/10/2017  . Tdap 12/04/2017  . Unspecified SARS-COV-2 Vaccination 10/06/2019  . Zoster Recombinat (Shingrix) 05/25/2014, 04/12/2020   Health Maintenance Health Maintenance  Topic Date Due  . INFLUENZA VACCINE  03/28/2020  . TETANUS/TDAP  12/05/2027  . COVID-19 Vaccine  Completed  . Hepatitis C Screening  Completed  . PNA vac Low Risk Adult  Completed  . COLONOSCOPY  Discontinued   Dental Screening: Recommended annual dental exams for proper oral hygiene  Community Resource Referral / Chronic Care Management: CRR required this visit?  No   CCM required this visit?  No      Plan:   Keep all routine maintenance appointments.   Follow up 06/15/20 at 1:00  I have personally reviewed and noted the following in the patient's chart:   . Medical and social history . Use of alcohol, tobacco or illicit drugs  . Current medications and supplements . Functional ability and status . Nutritional status . Physical activity . Advanced directives . List of other physicians . Hospitalizations, surgeries, and ER visits in previous 12 months . Vitals . Screenings  to include cognitive, depression, and falls . Referrals and appointments  In addition, I have reviewed and discussed with patient certain preventive protocols, quality metrics, and best practice recommendations. A  written personalized care plan for preventive services as well as general preventive health recommendations were provided to patient via mychart.     Varney Biles, LPN   [id number]

## 2020-06-15 ENCOUNTER — Other Ambulatory Visit: Payer: Self-pay

## 2020-06-15 ENCOUNTER — Encounter: Payer: Self-pay | Admitting: Internal Medicine

## 2020-06-15 ENCOUNTER — Ambulatory Visit (INDEPENDENT_AMBULATORY_CARE_PROVIDER_SITE_OTHER): Payer: Medicare HMO | Admitting: Internal Medicine

## 2020-06-15 VITALS — BP 118/80 | HR 52 | Temp 98.0°F | Ht 68.0 in | Wt 176.6 lb

## 2020-06-15 DIAGNOSIS — R001 Bradycardia, unspecified: Secondary | ICD-10-CM | POA: Diagnosis not present

## 2020-06-15 DIAGNOSIS — Z1389 Encounter for screening for other disorder: Secondary | ICD-10-CM

## 2020-06-15 DIAGNOSIS — R002 Palpitations: Secondary | ICD-10-CM

## 2020-06-15 DIAGNOSIS — R7303 Prediabetes: Secondary | ICD-10-CM

## 2020-06-15 DIAGNOSIS — E785 Hyperlipidemia, unspecified: Secondary | ICD-10-CM

## 2020-06-15 DIAGNOSIS — I493 Ventricular premature depolarization: Secondary | ICD-10-CM

## 2020-06-15 DIAGNOSIS — Z125 Encounter for screening for malignant neoplasm of prostate: Secondary | ICD-10-CM

## 2020-06-15 DIAGNOSIS — E041 Nontoxic single thyroid nodule: Secondary | ICD-10-CM

## 2020-06-15 NOTE — Progress Notes (Signed)
Chief Complaint  Patient presents with  . Follow-up   F/u  1. Recurrent shingles left neck x 2 x resolved for now  2. Dental extraction upcoming [facility] pending in the future right upper tooth  3. S/p left shoulder surgery Dr. Raliegh Ip doing better improved ROM and no pain   Review of Systems  Constitutional: Positive for weight loss.       Down 7 lbs   HENT: Negative for hearing loss.   Eyes: Negative for blurred vision.  Respiratory: Negative for shortness of breath.   Cardiovascular: Negative for chest pain.  Gastrointestinal: Negative for abdominal pain.  Musculoskeletal: Negative for falls and joint pain.  Skin: Negative for rash.  Neurological: Negative for headaches.  Psychiatric/Behavioral: Negative for depression and memory loss.   Past Medical History:  Diagnosis Date  . Arthritis   . Atrial fibrillation ([place])    per pt intermittent since 2003   . Claustrophobia   . Dysrhythmia   . Multiple thyroid nodules   . Palpitations   . PVC's (premature ventricular contractions)   . Rotator cuff tear    s/p surgery in 2000 now has tear per MRI ortho [facility] Dr. Edd Fabian last steroid shot (right)   Past Surgical History:  Procedure Laterality Date  . KNEE SURGERY Left   . left shoulder surgery     [facility] Dr. Raliegh Ip   . MOLE REMOVAL  2004   Dr. Collene Schlichter  . SHOULDER SURGERY Right 1999/2000   x1  . TONSILLECTOMY     1956   Family History  Problem Relation Age of Onset  . Melanoma Father        hand and arms  . Arthritis Father   . Cancer Father        throat dx age 66   . Heart disease Father        MI age 41 and CABG hx   . Arthritis Mother   . Stroke Mother        age 30  . Heart disease Mother        heart valve replaced older age   . Renal Disease Paternal Grandmother        [place]    Social History   Socioeconomic History  . Marital status: Single    Spouse name: Not on file  . Number of children: Not on file  . Years of  education: Not on file  . Highest education level: Not on file  Occupational History  . Not on file  Tobacco Use  . Smoking status: Never Smoker  . Smokeless tobacco: Never Used  Vaping Use  . Vaping Use: Never used  Substance and Sexual Activity  . Alcohol use: Yes    Comment: socially  . Drug use: No  . Sexual activity: Yes    Comment: women   Other Topics Concern  . Not on file  Social History Narrative   Masters, professor    Single has adult kids sons    Social Determinants of Radio broadcast assistant Strain: Low Risk   . Difficulty of Paying Living Expenses: Not hard at all  Food Insecurity: No Food Insecurity  . Worried About Charity fundraiser in the Last Year: Never true  . Ran Out of Food in the Last Year: Never true  Transportation Needs: No Transportation Needs  . Lack of Transportation (Medical): No  . Lack of Transportation (Non-Medical): No  Physical Activity:   .  Days of Exercise per Week: Not on file  . Minutes of Exercise per Session: Not on file  Stress: No Stress Concern Present  . Feeling of Stress : Not at all  Social Connections:   . Frequency of Communication with Friends and Family: Not on file  . Frequency of Social Gatherings with Friends and Family: Not on file  . Attends Religious Services: Not on file  . Active Member of Clubs or Organizations: Not on file  . Attends Archivist Meetings: Not on file  . Marital Status: Not on file  Intimate Partner Violence: Not At Risk  . Fear of Current or Ex-Partner: No  . Emotionally Abused: No  . Physically Abused: No  . Sexually Abused: No   Current Meds  Medication Sig  . Lysine 500 MG TABS Take 500 mg by mouth daily.   . metoprolol tartrate (LOPRESSOR) 25 MG tablet Take 1 tablet (25 mg total) by mouth 2 (two) times daily.  . Misc Natural Products (PROSTATE HEALTH PO) Take by mouth in the morning and at bedtime. Beta Prostate  . Multiple Vitamins-Minerals (MULTIVITAMIN ADULT PO)  Take 1 tablet by mouth daily.   . Red Yeast Rice 600 MG CAPS Take 600 mg by mouth in the morning and at bedtime.    Allergies  Allergen Reactions  . Other Other (See Comments)    Avoid fluoroquinolones d/t aortic root dilatation   . Wheat Bran     WHOLE WHEAT   Recent Results (from the past 2160 hour(s))  Novel Coronavirus, NAA ([facility])     Status: None   Collection Time: 04/14/20 10:49 AM   Specimen: Nasopharyngeal(NP) swabs in vial transport medium   Nasopharynge  Screenin  Result Value Ref Range   SARS-CoV-2, NAA Not Detected Not Detected    Comment: This nucleic acid amplification test was developed and its performance characteristics determined by [employer]. Nucleic acid amplification tests include RT-PCR and TMA. This test has not been FDA cleared or approved. This test has been authorized by FDA under an Emergency Use Authorization (EUA). This test is only authorized for the duration of time the declaration that circumstances exist justifying the authorization of the emergency use of in vitro diagnostic tests for detection of SARS-CoV-2 virus and/or diagnosis of COVID-19 infection under section 564(b)(1) of the Act, 21 U.S.C. 742VZD-6(L) (1), unless the authorization is terminated or revoked sooner. When diagnostic testing is negative, the possibility of a false negative result should be considered in the context of a patient's recent exposures and the presence of clinical signs and symptoms consistent with COVID-19. An individual without symptoms of COVID-19 and who is not shedding SARS-CoV-2 virus wo uld expect to have a negative (not detected) result in this assay.   SARS-COV-2, NAA 2 DAY TAT     Status: None   Collection Time: 04/14/20 10:49 AM   Nasopharynge  Screenin  Result Value Ref Range   SARS-CoV-2, NAA 2 DAY TAT Performed    Objective  Body mass index is 26.85 kg/m. Wt Readings from Last 3 Encounters:  06/15/20 176 lb 9.6 oz (80.1 kg)   06/01/20 183 lb (83 kg)  12/10/19 183 lb 3.2 oz (83.1 kg)   Temp Readings from Last 3 Encounters:  06/15/20 98 F (36.7 C) (Oral)  12/10/19 (!) 97.4 F (36.3 C) (Temporal)  11/13/19 (!) 96.6 F (35.9 C) (Temporal)   BP Readings from Last 3 Encounters:  06/15/20 118/80  12/10/19 110/84  11/13/19 113/67  Pulse Readings from Last 3 Encounters:  06/15/20 (!) 52  12/10/19 (!) 58  11/13/19 (!) 39    Physical Exam Vitals and nursing note reviewed.  Constitutional:      Appearance: Normal appearance. He is well-developed, well-groomed and overweight.  HENT:     Head: Normocephalic and atraumatic.  Eyes:     Conjunctiva/sclera: Conjunctivae normal.     Pupils: Pupils are equal, round, and reactive to light.  Cardiovascular:     Rate and Rhythm: Normal rate and regular rhythm.     Heart sounds: Normal heart sounds. No murmur heard.   Pulmonary:     Effort: Pulmonary effort is normal.     Breath sounds: Normal breath sounds.  Abdominal:     General: Abdomen is flat. Bowel sounds are normal.     Tenderness: There is no abdominal tenderness.  Skin:    General: Skin is warm and dry.  Neurological:     General: No focal deficit present.     Mental Status: He is alert and oriented to person, place, and time. Mental status is at baseline.     Gait: Gait normal.  Psychiatric:        Attention and Perception: Attention and perception normal.        Mood and Affect: Mood and affect normal.        Speech: Speech normal.        Behavior: Behavior normal. Behavior is cooperative.        Thought Content: Thought content normal.        Cognition and Memory: Cognition and memory normal.        Judgment: Judgment normal.     Assessment  Plan   PVC's (premature ventricular contractions) - Plan: Comprehensive metabolic panel, CBC with Differential/Platelet  Palpitations - Plan: Comprehensive metabolic panel, CBC with Differential/Platelet Bradycardia - Plan: Comprehensive  metabolic panel, CBC with Differential/Platelet, TSH F/u Dr. Fletcher Anon  Pt wants f/u to discuss metoprolol and bradycardia and are there other alternatives to BB he is c/w long term use and makes him tired  Sister sch Friday for ? Ablation FYI  Can you all call pt and schedule his HR ranging 40s-50s to >100 but less freq >100 and w/o sx's    Left thyroid nodule - Plan: TSH Thyroid US due 02/2021 f/u rec 2 more years of 5   Prediabetes - Plan: Hemoglobin A1c  Hyperlipidemia, unspecified hyperlipidemia type - Plan: Lipid panel   HM utd flu10/621 -prevnar had 06/04/15, pna 23utd,shingles had 05/25/14(zostervax) -confirm with pharmacy and if not had shingrixhad 1/2 logged check 2nd dose  Tdap utd4/9/19 hep B vaccine givenRx prevto get this covid 2/2 moderna  cologuard neg 08/05/18 normal Never smoker Hep C neg 04/2016  PSA6/18/20 1.08 check in 1 year rec hep B vaccine 03/14/17 titer low <10. sAg and core total neg  [place] [facility] seen12/10/2018 bx pending sch fasting labsfall 2021   Thyroid US 02/27/20 IMPRESSION: Left inferior thyroid nodule (labeled 1, TR 4, 1.0 cm) and the left inferior thyroid nodule (labeled 2, TR 4, 1.0 cm) both meet criteria for surveillance, as designated by the newly established ACR TI-RADS criteria. Surveillance ultrasound study recommended to be performed annually up to 5 years.     Provider: Dr. Olivia Mackie McLean-Scocuzza-Internal Medicine

## 2020-06-15 NOTE — Patient Instructions (Addendum)
2 vaccines  Moderna and 2nd shingrix  Wait 2-4 weeks   Earwax Buildup, Adult The ears produce a substance called earwax that helps keep bacteria out of the ear and protects the skin in the ear canal. Occasionally, earwax can build up in the ear and cause discomfort or hearing loss. What increases the risk? This condition is more likely to develop in people who:  Are male.  Are elderly.  Naturally produce more earwax.  Clean their ears often with cotton swabs.  Use earplugs often.  Use in-ear headphones often.  Wear hearing aids.  Have narrow ear canals.  Have earwax that is overly thick or sticky.  Have eczema.  Are dehydrated.  Have excess hair in the ear canal. What are the signs or symptoms? Symptoms of this condition include:  Reduced or muffled hearing.  A feeling of fullness in the ear or feeling that the ear is plugged.  Fluid coming from the ear.  Ear pain.  Ear itch.  Ringing in the ear.  Coughing.  An obvious piece of earwax that can be seen inside the ear canal. How is this diagnosed? This condition may be diagnosed based on:  Your symptoms.  Your medical history.  An ear exam. During the exam, your health care provider will look into your ear with an instrument called an otoscope. You may have tests, including a hearing test. How is this treated? This condition may be treated by:  Using ear drops to soften the earwax.  Having the earwax removed by a health care provider. The health care provider may: ? Flush the ear with water. ? Use an instrument that has a loop on the end (curette). ? Use a suction device.  Surgery to remove the wax buildup. This may be done in severe cases. Follow these instructions at home:   Take over-the-counter and prescription medicines only as told by your health care provider.  Do not put any objects, including cotton swabs, into your ear. You can clean the opening of your ear canal with a washcloth or  facial tissue.  Follow instructions from your health care provider about cleaning your ears. Do not over-clean your ears.  Drink enough fluid to keep your urine clear or pale yellow. This will help to thin the earwax.  Keep all follow-up visits as told by your health care provider. If earwax builds up in your ears often or if you use hearing aids, consider seeing your health care provider for routine, preventive ear cleanings. Ask your health care provider how often you should schedule your cleanings.  If you have hearing aids, clean them according to instructions from the manufacturer and your health care provider. Contact a health care provider if:  You have ear pain.  You develop a fever.  You have blood, pus, or other fluid coming from your ear.  You have hearing loss.  You have ringing in your ears that does not go away.  Your symptoms do not improve with treatment.  You feel like the room is spinning (vertigo). Summary  Earwax can build up in the ear and cause discomfort or hearing loss.  The most common symptoms of this condition include reduced or muffled hearing and a feeling of fullness in the ear or feeling that the ear is plugged.  This condition may be diagnosed based on your symptoms, your medical history, and an ear exam.  This condition may be treated by using ear drops to soften the earwax or by having  the earwax removed by a health care provider.  Do not put any objects, including cotton swabs, into your ear. You can clean the opening of your ear canal with a washcloth or facial tissue. This information is not intended to replace advice given to you by your health care provider. Make sure you discuss any questions you have with your health care provider. Document Revised: 07/27/2017 Document Reviewed: 10/25/2016 Elsevier Patient Education  2020 [employer].

## 2020-06-21 ENCOUNTER — Other Ambulatory Visit: Payer: Self-pay | Admitting: Cardiovascular Disease

## 2020-06-21 NOTE — Progress Notes (Signed)
He is overdue for follow-up with Dr. Caryl Comes ([facility] cardiology) per Dr. Haze Justin, can you please schedule follow-up with Dr. Caryl Comes to discuss management of PVCs.   Inform pt please

## 2020-06-24 NOTE — Progress Notes (Signed)
LMOV to schedule with Dr Caryl Comes

## 2020-06-28 ENCOUNTER — Other Ambulatory Visit (INDEPENDENT_AMBULATORY_CARE_PROVIDER_SITE_OTHER): Payer: Medicare HMO

## 2020-06-28 ENCOUNTER — Other Ambulatory Visit: Payer: Self-pay

## 2020-06-28 DIAGNOSIS — I493 Ventricular premature depolarization: Secondary | ICD-10-CM | POA: Diagnosis not present

## 2020-06-28 DIAGNOSIS — R7303 Prediabetes: Secondary | ICD-10-CM | POA: Diagnosis not present

## 2020-06-28 DIAGNOSIS — E785 Hyperlipidemia, unspecified: Secondary | ICD-10-CM | POA: Diagnosis not present

## 2020-06-28 DIAGNOSIS — E041 Nontoxic single thyroid nodule: Secondary | ICD-10-CM | POA: Diagnosis not present

## 2020-06-28 DIAGNOSIS — R002 Palpitations: Secondary | ICD-10-CM | POA: Diagnosis not present

## 2020-06-28 DIAGNOSIS — R001 Bradycardia, unspecified: Secondary | ICD-10-CM

## 2020-06-28 DIAGNOSIS — Z125 Encounter for screening for malignant neoplasm of prostate: Secondary | ICD-10-CM

## 2020-06-28 DIAGNOSIS — Z1389 Encounter for screening for other disorder: Secondary | ICD-10-CM

## 2020-06-28 LAB — COMPREHENSIVE METABOLIC PANEL
ALT: 17 U/L (ref 0–53)
AST: 21 U/L (ref 0–37)
Albumin: 4.4 g/dL (ref 3.5–5.2)
Alkaline Phosphatase: 66 U/L (ref 39–117)
BUN: 20 mg/dL (ref 6–23)
CO2: 28 mEq/L (ref 19–32)
Calcium: 9 mg/dL (ref 8.4–10.5)
Chloride: 103 mEq/L (ref 96–112)
Creatinine, Ser: 0.93 mg/dL (ref 0.40–1.50)
GFR: 82.62 mL/min (ref >60.00)
Glucose, Bld: 89 mg/dL (ref 70–99)
Potassium: 4.3 mEq/L (ref 3.5–5.1)
Sodium: 140 mEq/L (ref 135–145)
Total Bilirubin: 0.5 mg/dL (ref 0.2–1.2)
Total Protein: 6.7 g/dL (ref 6.0–8.3)

## 2020-06-28 LAB — HEMOGLOBIN A1C: Hgb A1c MFr Bld: 5.9 % (ref 4.6–6.5)

## 2020-06-28 LAB — LIPID PANEL
Cholesterol: 168 mg/dL (ref 0–200)
HDL: 43.4 mg/dL (ref >39.00)
LDL Cholesterol: 99 mg/dL (ref 0–99)
NonHDL: 124.88
Total CHOL/HDL Ratio: 4
Triglycerides: 128 mg/dL (ref 0.0–149.0)
VLDL: 25.6 mg/dL (ref 0.0–40.0)

## 2020-06-28 LAB — CBC WITH DIFFERENTIAL/PLATELET
Basophils Absolute: 0.1 10*3/uL (ref 0.0–0.1)
Basophils Relative: 1.1 % (ref 0.0–3.0)
Eosinophils Absolute: 0.1 10*3/uL (ref 0.0–0.7)
Eosinophils Relative: 1.4 % (ref 0.0–5.0)
HCT: 47.5 % (ref 39.0–52.0)
Hemoglobin: 16 g/dL (ref 13.0–17.0)
Lymphocytes Relative: 32.3 % (ref 12.0–46.0)
Lymphs Abs: 2.4 10*3/uL (ref 0.7–4.0)
MCHC: 33.7 g/dL (ref 30.0–36.0)
MCV: 93 fl (ref 78.0–100.0)
Monocytes Absolute: 0.8 10*3/uL (ref 0.1–1.0)
Monocytes Relative: 11.2 % (ref 3.0–12.0)
Neutro Abs: 4 10*3/uL (ref 1.4–7.7)
Neutrophils Relative %: 54 % (ref 43.0–77.0)
Platelets: 159 10*3/uL (ref 150.0–400.0)
RBC: 5.11 Mil/uL (ref 4.22–5.81)
RDW: 13.2 % (ref 11.5–15.5)
WBC: 7.4 10*3/uL (ref 4.0–10.5)

## 2020-06-28 LAB — PSA: PSA: 0.89 ng/mL (ref 0.10–4.00)

## 2020-06-28 LAB — TSH: TSH: 1.27 u[IU]/mL (ref 0.35–4.50)

## 2020-06-29 LAB — URINALYSIS, ROUTINE W REFLEX MICROSCOPIC
Bacteria, UA: NONE SEEN /HPF
Bilirubin Urine: NEGATIVE
Glucose, UA: NEGATIVE
Hyaline Cast: NONE SEEN /LPF
Ketones, ur: NEGATIVE
Leukocytes,Ua: NEGATIVE
Nitrite: NEGATIVE
Specific Gravity, Urine: 1.024 (ref 1.001–1.03)
Squamous Epithelial / HPF: NONE SEEN /HPF (ref <=5)
WBC, UA: NONE SEEN /HPF (ref 0–5)
pH: 5.5 (ref 5.0–8.0)

## 2020-06-30 ENCOUNTER — Telehealth: Payer: Self-pay | Admitting: Internal Medicine

## 2020-06-30 NOTE — Telephone Encounter (Signed)
Patient was returning call for results 

## 2020-07-01 ENCOUNTER — Telehealth: Payer: Self-pay | Admitting: *Deleted

## 2020-07-01 NOTE — Progress Notes (Signed)
Attempted to schedule.  LMOV to call office.  ° °

## 2020-07-01 NOTE — Telephone Encounter (Signed)
Patient informed and verbalized understanding

## 2020-07-01 NOTE — Telephone Encounter (Signed)
Please place future orders for lab appt.  

## 2020-07-02 ENCOUNTER — Other Ambulatory Visit: Payer: Self-pay | Admitting: Internal Medicine

## 2020-07-02 ENCOUNTER — Other Ambulatory Visit: Payer: Self-pay

## 2020-07-02 ENCOUNTER — Other Ambulatory Visit: Payer: Medicare HMO

## 2020-07-02 DIAGNOSIS — R319 Hematuria, unspecified: Secondary | ICD-10-CM

## 2020-07-02 NOTE — Telephone Encounter (Signed)
Just urine labs thanks

## 2020-07-03 LAB — URINALYSIS, ROUTINE W REFLEX MICROSCOPIC
Bilirubin Urine: NEGATIVE
Glucose, UA: NEGATIVE
Hgb urine dipstick: NEGATIVE
Ketones, ur: NEGATIVE
Leukocytes,Ua: NEGATIVE
Nitrite: NEGATIVE
Protein, ur: NEGATIVE
Specific Gravity, Urine: 1.021 (ref 1.001–1.03)
pH: 5.5 (ref 5.0–8.0)

## 2020-07-03 LAB — URINE CULTURE
MICRO NUMBER:: 11166456
Result:: NO GROWTH
SPECIMEN QUALITY:: ADEQUATE

## 2020-07-05 ENCOUNTER — Telehealth: Payer: Self-pay | Admitting: Internal Medicine

## 2020-07-05 NOTE — Telephone Encounter (Signed)
Patient returned Brock's phone call for lab results.

## 2020-07-05 NOTE — Telephone Encounter (Signed)
See result note.  

## 2020-07-07 ENCOUNTER — Telehealth: Payer: Self-pay | Admitting: Internal Medicine

## 2020-07-07 NOTE — Telephone Encounter (Signed)
Message received from the patient's PCP on 06/15/20  Stating the patient was overdue for follow up with Dr. Caryl Comes and needed to see him back for further evaluation of his PVC's.  Our office has attempted to reach this patient's 3 times to schedule.  I have tried him again today. I left a message to please call back at the request of his PCP to schedule an appointment with Dr. Caryl Comes.  We could potentially add him on on 12/2 or 12/9.

## 2020-07-09 ENCOUNTER — Telehealth: Payer: Self-pay | Admitting: Internal Medicine

## 2020-07-09 NOTE — Telephone Encounter (Signed)
Patient returning call. Scheduled 12/9

## 2020-07-12 NOTE — Telephone Encounter (Signed)
The patient is scheduled to see Dr. Caryl Comes on 08/05/20 at 10:00 am.

## 2020-08-05 ENCOUNTER — Other Ambulatory Visit: Payer: Self-pay

## 2020-08-05 ENCOUNTER — Encounter: Payer: Self-pay | Admitting: Internal Medicine

## 2020-08-05 ENCOUNTER — Ambulatory Visit: Payer: Medicare HMO | Admitting: Internal Medicine

## 2020-08-05 VITALS — BP 132/66 | HR 78 | Ht 68.0 in | Wt 181.0 lb

## 2020-08-05 DIAGNOSIS — I493 Ventricular premature depolarization: Secondary | ICD-10-CM | POA: Diagnosis not present

## 2020-08-05 DIAGNOSIS — I10 Essential (primary) hypertension: Secondary | ICD-10-CM

## 2020-08-05 MED ORDER — NEBIVOLOL HCL 2.5 MG PO TABS: 2.5000 mg | ORAL_TABLET | Freq: Every day | ORAL | 0 refills | Status: DC

## 2020-08-05 MED ORDER — DILTIAZEM HCL ER COATED BEADS 120 MG PO CP24: 120.0000 mg | ORAL_CAPSULE | Freq: Every day | ORAL | 0 refills | Status: DC

## 2020-08-05 MED ORDER — PROPRANOLOL HCL ER 60 MG PO CP24: 60.0000 mg | ORAL_CAPSULE | Freq: Every day | ORAL | 0 refills | Status: DC

## 2020-08-05 MED ORDER — VERAPAMIL HCL ER 120 MG PO TBCR
120.0000 mg | EXTENDED_RELEASE_TABLET | Freq: Every day | ORAL | 0 refills | Status: DC
Start: 2020-08-05 — End: 2020-11-15

## 2020-08-05 NOTE — Progress Notes (Signed)
Patient Care Team: McLean-Scocuzza, Nino Glow, MD as PCP - General (Internal Medicine) Wellington Hampshire, MD as PCP - Cardiology (Cardiology)   HPI  James Carrillo is a 69 y.o. male seen in followup for PVCs modestly symptomatic.  GI syymptoms prompting  A decrease in his metoprolol was associated with some relatively nondescript symptoms,     HR on monitor is 40-80 suggesting ongoing   frequent ventricular ectopy and he felt better with re uptitration with fewer PVCs, however, more fatigue.  He is impressed that metoprolol just seems to sap his energy.  Denies exercise intolerance.    Date PVCs  11/19 36%       DATE TEST EF   12/19 Echo   55-60 % Ao Root 24mm  12/19 CTA  Ao Root 41 mm  4/20 Myoview  70% Perfusion defect  ? artifact  10/20 Echo  55-60% AoRoot 43 mm  2/21 CTA  Ao Root 81mm   Date Cr K Hgb  11/21 0.93 4.3 16.0      Records and Results Reviewed   Past Medical History:  Diagnosis Date  . Arthritis   . Atrial fibrillation ([place])    per pt intermittent since 2003   . Claustrophobia   . Dysrhythmia   . Multiple thyroid nodules   . Palpitations   . PVC's (premature ventricular contractions)   . Rotator cuff tear    s/p surgery in 2000 now has tear per MRI ortho [facility] Dr. Edd Fabian last steroid shot (right)    Past Surgical History:  Procedure Laterality Date  . KNEE SURGERY Left   . left shoulder surgery     [facility] Dr. Raliegh Ip   . MOLE REMOVAL  2004   Dr. Collene Schlichter  . SHOULDER SURGERY Right 1999/2000   x1  . TONSILLECTOMY     1956    Current Meds  Medication Sig  . Lysine 500 MG TABS Take 500 mg by mouth daily.   . metoprolol tartrate (LOPRESSOR) 25 MG tablet Take 1 tablet (25 mg total)by mouth 2 (two) times daily.  . Misc Natural Products (PROSTATE HEALTH PO) Take by mouth in the morning and at bedtime. Beta Prostate  . Multiple Vitamins-Minerals (MULTIVITAMIN ADULT PO) Take 1 tablet by mouth daily.   . Red Yeast Rice 600 MG  CAPS Take 600 mg by mouth in the morning and at bedtime.     Allergies  Allergen Reactions  . Other Other (See Comments)    Avoid fluoroquinolones d/t aortic root dilatation   . Wheat Bran     WHOLE WHEAT      Review of Systems negative except from HPI and PMH  Physical Exam BP 132/66   Pulse 78   Ht 5\' 8"  (1.727 m)   Wt 181 lb (82.1 kg)   BMI 27.52 kg/m  Well developed and nourished in no acute distress HENT normal Neck supple with JVP  Clear Irregular rate and rhythm, no murmurs or gallops Abd-soft with active BS No Clubbing cyanosis edema Skin-warm and dry A & Oriented  Grossly normal sensory and motor function  ECG sinus at 78 Intervals 18/07/40 Bigeminal PVCs-outflow tract with a transition V2 and QR in aVR and aVL  Assessment and  Plan  PVC  OT freq probably left ventricular outflow tract  Tachypalps ? Atrial fib  Aortic Root dilitation   Fatigue  Complaining of fatigue.  He is associates this with the metoprolol.  Hence, we will  discontinue the metoprolol and give him alternative therapies, waiting for 2 weeks prior to initiation so as to establish new baseline.  Prescriptions are given for atenolol 25, nebivolol 2.5, verapamil 120 and diltiazem 120.  In the event that these are unsuccessful in suppressing his PVCs, given recent data suggesting the very high frequency of cardiomyopathy if the PVCs are followed for a long enough time ie, bigeminy and quadrigeminy been associated with a 80% plus sensitivity of the development of cardiomyopathy, and in the context of his young age have suggested that more intentional obliteration would be indicated.  He is disinclined towards ablation at this point but would be open to an antiarrhythmic drug i.e. flecainide  Once we have what we think is effective therapy, will need monitoring to clarify  His aortic root was smaller by CTA.  The recommendation is annually.  I will defer this to Dr. Audelia Acton    No  quinolones       Current medicines are reviewed at length with the patient today .  The patient does not  have concerns regarding medicines.

## 2020-08-05 NOTE — Patient Instructions (Signed)
Medication Instructions:  - Your physician has recommended you make the following change in your medication:   1) STOP metoprolol  2) We are giving you 4 different prescriptions to try in place of this to see what works best for the PVC's you are having. You may try them in any order, but DO NOT take more than 1 prescription at a time. Try to give at least 2 weeks on each one to see which you like best. You may then let us know and we can refill this for you at the pharmacy- [phone]  - Inderal LA 60 mg- take 1 capsule by mouth once daily - Diltiazem 120 mg- take 1 capsule by mouth once daily  - Nebivolol 2.5 mg- take 1 tablet by mouth once daily  - Verapamil 120 mg- take 1 tablet by mouth once daily   *If you need a refill on your cardiac medications before your next appointment, please call your pharmacy*   Lab Work: - none ordered  If you have labs (blood work) drawn today and your tests are completely normal, you will receive your results only by: Marland Kitchen MyChart Message (if you have MyChart) OR . A paper copy in the mail If you have any lab test that is abnormal or we need to change your treatment, we will call you to review the results.   Testing/Procedures: - none ordered   Follow-Up: At [facility], you and your health needs are our priority.  As part of our continuing mission to provide you with exceptional heart care, we have created designated Provider Care Teams.  These Care Teams include your primary Cardiologist (physician) and Advanced Practice Providers (APPs -  Physician Assistants and Nurse Practitioners) who all work together to provide you with the care you need, when you need it.  We recommend signing up for the patient portal called "MyChart".  Sign up information is provided on this After Visit Summary.  MyChart is used to connect with patients for Virtual Visits (Telemedicine).  Patients are able to view lab/test results, encounter notes, upcoming  appointments, etc.  Non-urgent messages can be sent to your provider as well.   To learn more about what you can do with MyChart, go to [email or web address].    Your next appointment:   3-4 month(s)  The format for your next appointment:   Virtual Visit   Provider:   Virl Axe, MD   Other Instructions n/a

## 2020-08-12 DIAGNOSIS — K030 Excessive attrition of teeth: Secondary | ICD-10-CM | POA: Diagnosis not present

## 2020-09-03 DIAGNOSIS — Z20822 Contact with and (suspected) exposure to covid-19: Secondary | ICD-10-CM | POA: Diagnosis not present

## 2020-10-05 DIAGNOSIS — K030 Excessive attrition of teeth: Secondary | ICD-10-CM | POA: Diagnosis not present

## 2020-10-14 DIAGNOSIS — K030 Excessive attrition of teeth: Secondary | ICD-10-CM | POA: Diagnosis not present

## 2020-11-15 ENCOUNTER — Other Ambulatory Visit: Payer: Self-pay | Admitting: *Deleted

## 2020-11-15 MED ORDER — VERAPAMIL HCL ER 120 MG PO TBCR: 120.0000 mg | EXTENDED_RELEASE_TABLET | Freq: Every day | ORAL | 6 refills | Status: DC

## 2020-11-17 NOTE — Telephone Encounter (Signed)
If he wants to think about an ablation as next step I can discuss with CL and we can set an appt As yong as he, ablation makes sense.

## 2020-12-01 DIAGNOSIS — M7521 Bicipital tendinitis, right shoulder: Secondary | ICD-10-CM | POA: Diagnosis not present

## 2020-12-02 ENCOUNTER — Encounter: Payer: Self-pay | Admitting: Internal Medicine

## 2020-12-10 ENCOUNTER — Encounter: Payer: Self-pay | Admitting: Internal Medicine

## 2020-12-13 NOTE — Telephone Encounter (Signed)
Noted...appt not canceled.

## 2020-12-14 ENCOUNTER — Other Ambulatory Visit: Payer: Self-pay

## 2020-12-14 ENCOUNTER — Encounter: Payer: Self-pay | Admitting: Internal Medicine

## 2020-12-14 ENCOUNTER — Ambulatory Visit (INDEPENDENT_AMBULATORY_CARE_PROVIDER_SITE_OTHER): Payer: Medicare HMO | Admitting: Internal Medicine

## 2020-12-14 VITALS — BP 110/80 | HR 51 | Temp 97.6°F | Ht 68.0 in | Wt 182.0 lb

## 2020-12-14 DIAGNOSIS — I493 Ventricular premature depolarization: Secondary | ICD-10-CM | POA: Diagnosis not present

## 2020-12-14 DIAGNOSIS — E785 Hyperlipidemia, unspecified: Secondary | ICD-10-CM | POA: Diagnosis not present

## 2020-12-14 DIAGNOSIS — E538 Deficiency of other specified B group vitamins: Secondary | ICD-10-CM

## 2020-12-14 DIAGNOSIS — E041 Nontoxic single thyroid nodule: Secondary | ICD-10-CM | POA: Diagnosis not present

## 2020-12-14 DIAGNOSIS — G629 Polyneuropathy, unspecified: Secondary | ICD-10-CM

## 2020-12-14 NOTE — Progress Notes (Addendum)
Chief Complaint  Patient presents with   Follow-up   F/u  1. Thyroid US needs serial thyroid US x 5 years had 3/5 years  2. PVCS EP Dr. Caryl Comes rec Ablation pt agreeable later today this on verapamil 120 CR cut dose in 1/2 due to full dose made feel groggy, napping, arthritis/depression  Will do ablation with Dr. Quentin Ore EP   Review of Systems  Constitutional: Negative for weight loss.  HENT: Negative for hearing loss.   Eyes: Negative for blurred vision.  Respiratory: Negative for shortness of breath.   Cardiovascular:       +PVCS  Gastrointestinal: Negative for abdominal pain.  Musculoskeletal: Negative for falls and joint pain.  Skin: Negative for rash.  Neurological: Negative for headaches.  Psychiatric/Behavioral: Negative for depression.   Past Medical History:  Diagnosis Date   Arthritis    Atrial fibrillation ([place])    per pt intermittent since 2003    Biceps tendinitis of right shoulder    [facility] [place] 12[facility]/20   Claustrophobia    COVID-19    07/2020 x 5 days bad cold   Dysrhythmia    Multiple thyroid nodules    Palpitations    PVC's (premature ventricular contractions)    Rotator cuff tear    s/p surgery in 2000 now has tear per MRI orth[facility] Dr. Edd Fabian last steroid shot (right)   Past Surgical History:  Procedure Laterality Date   KNEE SURGERY Left    left shoulder surgery     [facility]21 [facility] Dr. Raliegh Ip    MOLE REMOVAL  2004   Dr. Collene Schlichter   SHOULDER SURGERY Right 1999/2000   x1   TONSILLECTOMY     1956   Family History  Problem Relation Age of Onset   Melanoma Father        hand and arms   Arthritis Father    Cancer Father        throat dx age 90    Heart disease Father        MI age 71 and CABG hx    Arthritis Mother    Stroke Mother        age 48   Heart disease Mother        heart valve replaced older age    Renal Disease Paternal Grandmother        [place]    Social History   Socioeconomic History   Marital status:  Single    Spouse name: Not on file   Number of children: Not on file   Years of education: Not on file   Highest education level: Not on file  Occupational History   Not on file  Tobacco Use   Smoking status: Never Smoker   Smokeless tobacco: Never Used  Vaping Use   Vaping Use: Never used  Substance and Sexual Activity   Alcohol use: Yes    Comment: socially   Drug use: No   Sexual activity: Yes    Comment: women   Other Topics Concern   Not on file  Social History Narrative   Masters, professor    Single has adult 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    Social Determinants of Radio broadcast assistant Strain: Low Risk    Difficulty of Paying Living Expenses: Not hard at all  Food Insecurity: No Food Insecurity   Worried About Charity fundraiser in the Last Year: Never true   Arboriculturist in the Last Year: Never  true  Transportation Needs: No Transportation Needs   Lack of Transportation (Medical): No   Lack of Transportation (Non-Medical): No  Physical Activity: Not on file  Stress: No Stress Concern Present   Feeling of Stress : Not at all  Social Connections: Not on file  Intimate Partner Violence: Not At Risk   Fear of Current or Ex-Partner: No   Emotionally Abused: No   Physically Abused: No   Sexually Abused: No   Current Meds  Medication Sig   Lysine 500 MG TABS Take 500 mg by mouth daily.    Multiple Vitamins-Minerals (MULTIVITAMIN ADULT PO) Take 1 tablet by mouth daily.    Red Yeast Rice 600 MG CAPS Take 600 mg by mouth in the morning and at bedtime.    verapamil (CALAN-SR) 120 MG CR tablet Take 1 tablet (120 mg total) by mouth daily.   Allergies  Allergen Reactions   Other Other (See Comments)    Avoid fluoroquinolones d/t aortic root dilatation    Wheat Bran     WHOLE WHEAT   No results found for this or any previous visit (from the past 2160 hour(s)). Objective  Body mass index is 27.67 kg/m. Wt Readings from Last 3 Encounters:  12/14/20 182 lb (82.6 kg)   08/05/20 181 lb (82.1 kg)  06/15/20 176 lb 9.6 oz (80.1 kg)   Temp Readings from Last 3 Encounters:  12/14/20 97.6 F (36.4 C) (Temporal)  06/15/20 98 F (36.7 C) (Oral)  12/10/19 (!) 97.4 F (36.3 C) (Temporal)   BP Readings from Last 3 Encounters:  12/14/20 110/80  08/05/20 132/66  06/15/20 118/80   Pulse Readings from Last 3 Encounters:  12/14/20 (!) 51  08/05/20 78  06/15/20 (!) 52    Physical Exam Vitals and nursing note reviewed.  Constitutional:      Appearance: Normal appearance. He is well-developed and well-groomed.  HENT:     Head: Normocephalic and atraumatic.  Cardiovascular:     Rate and Rhythm: Normal rate and regular rhythm.     Heart sounds: Normal heart sounds. No murmur heard.   Pulmonary:     Effort: Pulmonary effort is normal.     Breath sounds: Normal breath sounds.  Abdominal:     Tenderness: There is no abdominal tenderness.  Skin:    General: Skin is warm and dry.  Neurological:     General: No focal deficit present.     Mental Status: He is alert and oriented to person, place, and time. Mental status is at baseline.     Gait: Gait normal.  Psychiatric:        Attention and Perception: Attention and perception normal.        Mood and Affect: Mood and affect normal.        Speech: Speech normal.        Behavior: Behavior normal. Behavior is cooperative.        Thought Content: Thought content normal.        Cognition and Memory: Cognition and memory normal.        Judgment: Judgment normal.     Assessment  Plan  Thyroid nodule - Plan: US THYROID due 02/26/21  F/u x 2 more years   PVCs (premature ventricular contractions) - Plan: Comprehensive metabolic panel, Lipid panel, CBC with Differential/Platelet Considering ablation   Hyperlipidemia, unspecified hyperlipidemia type - Plan: Lipid panel   08/17/21 c/o sx's of lower ext neuropathy will refer to dr. Manuella Ghazi for ncs/emg  HM utd flu 10/621 -prevnar had 06/04/15, pna 23 utd  ,shingles had 05/25/14 (zostervax)  -confirm with pharmacy and if not had shingrix had 1/2 logged check 2nd dose  Tdap utd 12/04/17   hep B vaccine given Rx prev to get this   covid 4/4 moderna   cologuard neg 08/05/18 normal  Never smoker Hep C neg 04/2016  PSA 0.89 06/28/20 check in 1 year   rec hep B vaccine 03/14/17 titer low <10. sAg and core total neg  [place] derm [facility] seen 07/31/2019 bx pending sch fasting labs fall 2021     Thyroid US 02/27/20 IMPRESSION: Left inferior thyroid nodule (labeled 1, TR 4, 1.0 cm) and the left inferior thyroid nodule (labeled 2, TR 4, 1.0 cm) both meet criteria for surveillance, as designated by the newly established ACR TI-RADS criteria. Surveillance ultrasound study recommended to be performed annually up to 5 years.    EP Dr. Caryl Comes  Cards Dr. Fletcher Anon      Provider: Dr. Olivia Mackie McLean-Scocuzza-Internal Medicine

## 2020-12-14 NOTE — Patient Instructions (Addendum)
cologaurd due 08/05/21   Thyroid  US 02/26/21   Dr. Caryl Comes message me and wanted to reach out to you again. He had mentioned that you having an ablation of your PVC's may be an option may eliminate the need for some of the medications. He advised if this would be of interest to you, he would be glad to reach out to his partner, Dr. Lars Mage, who does these procedures to see if you might be a candidate for this.   I have attached some information below about ablation. Just let me know your thoughts?   Thank you! Heather, RN     Cardiac Ablation Cardiac ablation is a procedure to destroy, or ablate, a small amount of heart tissue in very specific places. The heart has many electrical connections. Sometimes these connections are abnormal and can cause the heart to beat very fast or irregularly. Ablating some of the areas that cause problems can improve the heart's rhythm or return it to normal. Ablation may be done for people who:  Have Wolff-Parkinson-White syndrome.  Have fast heart rhythms (tachycardia).  Have taken medicines for an abnormal heart rhythm (arrhythmia) that were not effective or caused side effects.  Have a high-risk heartbeat that may be life-threatening. During the procedure, a small incision is made in the neck or the groin, and a long, thin tube (catheter) is inserted into the incision and moved to the heart. Small devices (electrodes) on the tip of the catheter will send out electrical currents. A type of X-ray (fluoroscopy) will be used to help guide the catheter and to provide images of the heart. Tell a health care provider about:  Any allergies you have.  All medicines you are taking, including vitamins, herbs, eye drops, creams, and over-the-counter medicines.  Any problems you or family members have had with anesthetic medicines.  Any blood disorders you have.  Any surgeries you have had.  Any medical conditions you have, such as kidney  failure.  Whether you are pregnant or may be pregnant. What are the risks? Generally, this is a safe procedure. However, problems may occur, including:  Infection.  Bruising and bleeding at the catheter insertion site.  Bleeding into the chest, especially into the sac that surrounds the heart. This is a serious complication.  Stroke or blood clots.  Damage to nearby structures or organs.  Allergic reaction to medicines or dyes.  Need for a permanent pacemaker if the normal electrical system is damaged. A pacemaker is a small computer that sends electrical signals to the heart and helps your heart beat normally.  The procedure not being fully effective. This may not be recognized until months later. Repeat ablation procedures are sometimes done. What happens before the procedure? Medicines Ask your health care provider about:  Changing or stopping your regular medicines. This is especially important if you are taking diabetes medicines or blood thinners.  Taking medicines such as aspirin and ibuprofen. These medicines can thin your blood. Do not take these medicines unless your health care provider tells you to take them.  Taking over-the-counter medicines, vitamins, herbs, and supplements. General instructions  Follow instructions from your health care provider about eating or drinking restrictions.  Plan to have someone take you home from the hospital or clinic.  If you will be going home right after the procedure, plan to have someone with you for 24 hours.  Ask your health care provider what steps will be taken to prevent infection. What happens during the  procedure? 1. An IV will be inserted into one of your veins. 2. You will be given a medicine to help you relax (sedative). 3. The skin on your neck or groin will be numbed. 4. An incision will be made in your neck or your groin. 5. A needle will be inserted through the incision and into a large vein in your neck or  groin. 6. A catheter will be inserted into the needle and moved to your heart. 7. Dye may be injected through the catheter to help your surgeon see the area of the heart that needs treatment. 8. Electrical currents will be sent from the catheter to ablate heart tissue in desired areas. There are three types of energy that may be used to do this: ? Heat (radiofrequency energy). ? Laser energy. ? Extreme cold (cryoablation). 9. When the tissue has been ablated, the catheter will be removed. 10. Pressure will be held on the insertion area to prevent a lot of bleeding. 11. A bandage (dressing) will be placed over the insertion area. The exact procedure may vary among health care providers and hospitals.   What happens after the procedure?  Your blood pressure, heart rate, breathing rate, and blood oxygen level will be monitored until you leave the hospital or clinic.  Your insertion area will be monitored for bleeding. You will need to lie still for a few hours to ensure that you do not bleed from the insertion area.  Do not drive for 24 hours or as long as told by your health care provider. Summary  Cardiac ablation is a procedure to destroy, or ablate, a small amount of heart tissue using an electrical current. This procedure can improve the heart rhythm or return it to normal.  Tell your health care provider about any medical conditions you may have and all medicines you are taking to treat them.  This is a safe procedure, but problems may occur. Problems may include infection, bruising, damage to nearby organs or structures, or allergic reactions to medicines.  Follow your health care provider's instructions about eating and drinking before the procedure. You may also be told to change or stop some of your medicines.  After the procedure, do not drive for 24 hours or as long as told by your health care provider. This information is not intended to replace advice given to you by your  health care provider. Make sure you discuss any questions you have with your health care provider. Document Revised: 06/23/2019 Document Reviewed: 06/23/2019 Elsevier Patient Education  [place].

## 2020-12-15 ENCOUNTER — Telehealth: Payer: Self-pay

## 2020-12-15 NOTE — Telephone Encounter (Signed)
LVM to reschedule July Labs and August f/u with Dr Kelly Services. She wants to see him for those in 6-9 months.

## 2020-12-20 DIAGNOSIS — M170 Bilateral primary osteoarthritis of knee: Secondary | ICD-10-CM | POA: Diagnosis not present

## 2021-01-31 ENCOUNTER — Other Ambulatory Visit: Payer: Self-pay | Admitting: *Deleted

## 2021-01-31 DIAGNOSIS — I493 Ventricular premature depolarization: Secondary | ICD-10-CM

## 2021-02-16 ENCOUNTER — Other Ambulatory Visit: Payer: Self-pay

## 2021-02-16 ENCOUNTER — Ambulatory Visit (INDEPENDENT_AMBULATORY_CARE_PROVIDER_SITE_OTHER): Payer: Medicare HMO

## 2021-02-16 ENCOUNTER — Encounter: Payer: Self-pay | Admitting: Cardiology

## 2021-02-16 ENCOUNTER — Ambulatory Visit: Payer: Medicare HMO | Admitting: Cardiology

## 2021-02-16 VITALS — BP 130/70 | HR 81 | Ht 68.0 in | Wt 177.0 lb

## 2021-02-16 DIAGNOSIS — I472 Ventricular tachycardia: Secondary | ICD-10-CM

## 2021-02-16 DIAGNOSIS — I493 Ventricular premature depolarization: Secondary | ICD-10-CM

## 2021-02-16 DIAGNOSIS — I471 Supraventricular tachycardia: Secondary | ICD-10-CM | POA: Diagnosis not present

## 2021-02-16 DIAGNOSIS — R000 Tachycardia, unspecified: Secondary | ICD-10-CM

## 2021-02-16 NOTE — Progress Notes (Signed)
Electrophysiology Office Follow up Visit Note:    Date:  02/16/2021   ID:  James Carrillo, DOB [birth year], MRN [id number]  PCP:  McLean-Scocuzza, Nino Glow, MD  [facility] Cardiologist:  Kathlyn Sacramento, MD  [place] Electrophysiologist: Dr. Caryl Comes.   Interval History:    James Carrillo is a 70 y.o. male who presents for an opinion regarding PVCs and palpitations at the request of Dr. Caryl Comes.  He has had PVCs for years.  They are largely asymptomatic.  For example today during our interview he is a having bigeminal PVCs that are monomorphic and he is completely asymptomatic.  He is highly active, swimming multiple times per week and going on long hikes several days per week.  He is physically fit.  He tells me though that there is another issue that is concerning him and that his periods of rapid heartbeats that can last for hours at a time.  His heart rate during these episodes is sustained in the 150s to 160s.  The heartbeats are regular.  He is captured many of these episodes on his [facility] device.  They spontaneously come on without specific triggers.  He has had them start when he is completely at rest.  He feels tired, chest pressure, during these episodes.  No syncope or presyncope.  He is tried multiple things for these including diltiazem, nebivolol, propranolol.  He did not tolerate these medications where they were ineffective.  He is currently taking verapamil 60 mg by mouth twice daily.  Despite this therapy he continues to have multiple episodes per week of this tachycardia.  At the 60 mg dose twice daily he is having less lightheadedness than when he was taking 120 mg by mouth once daily.  He is very clear that his tachycardia episodes are distinct from his PVCs.     Past Medical History:  Diagnosis Date   Arthritis    Atrial fibrillation ([place])    per pt intermittent since 2003    Biceps tendinitis of right shoulder    [facility]  [place] 12/[facility]/20    Claustrophobia    COVID-19    07/2020 x 5 days bad cold   Dysrhythmia    Multiple thyroid nodules    Palpitations    PVC's (premature ventricular contractions)    Rotator cuff tear    s/p surgery in 2000 now has tear per MRI ortho [facility] Dr. Edd Fabian last steroid shot (right)    Past Surgical History:  Procedure Laterality Date   KNEE SURGERY Left    left shoulder surgery     [facility] Dr. Raliegh Ip    MOLE REMOVAL  2004   Dr. Collene Schlichter   SHOULDER SURGERY Right 1999/2000   x1   TONSILLECTOMY     1956    Current Medications: Current Meds  Medication Sig   Lysine 500 MG TABS Take 500 mg by mouth daily.    Multiple Vitamins-Minerals (MULTIVITAMIN ADULT PO) Take 1 tablet by mouth daily.    Red Yeast Rice 600 MG CAPS Take 600 mg by mouth in the morning and at bedtime.    verapamil (CALAN-SR) 120 MG CR tablet Take 1 tablet (120 mg total) by mouth daily.     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    Social Determinants of Radio broadcast assistant Strain: Low Risk    Difficulty of Paying Living Expenses: Not hard at all  Food Insecurity: No Food Insecurity   Worried About Charity fundraiser in the Last Year: Never true   Arboriculturist in the Last Year: Never true  Transportation Needs: No Transportation Needs   Lack of Transportation (Medical): No   Lack of Transportation (Non-Medical):  No  Physical Activity: Not on file  Stress: No Stress Concern Present   Feeling of Stress : Not at all  Social Connections: Not on file     Family History: The patient's family history includes Arthritis in his father and mother; Cancer in his father; Heart disease in his father and mother; Melanoma in his father; Renal Disease in his paternal grandmother; Stroke in his mother.  ROS:   Please see the history of present illness.    All other systems reviewed and are negative.  EKGs/Labs/Other Studies Reviewed:    The following studies were reviewed today:  08/05/2020 ECG Bigeminal PVC, likely LVOT.     I have personally reviewed multiple rhythm strips from his [facility] device which demonstrate a regular tachycardia in the 150s to 160s beats per minute.  The QRS complex is wide.  I do not see any obvious evidence of A-V dissociation.   EKG:  The ekg ordered today demonstrates sinus rhythm with bigeminal PVCs.  The PVC has a steeply inferior axis and has positive precordial concordance.  aVR and aVL are equally negative with a QS.  Recent Labs: 06/28/2020: ALT 17; BUN 20; Creatinine, Ser 0.93; Hemoglobin 16.0; Platelets 159.0; Potassium 4.3; Sodium 140; TSH 1.27  Recent Lipid Panel    Component Value Date/Time   CHOL 168 06/28/2020 0840   TRIG 128.0 06/28/2020 0840   HDL 43.40 06/28/2020 0840   CHOLHDL 4 06/28/2020 0840   VLDL 25.6 06/28/2020 0840   LDLCALC 99 06/28/2020 0840   LDLDIRECT 95.0 08/20/2019 0937    Physical Exam:    VS:  BP 130/70 (BP Location: Left Arm, Patient Position: Sitting, Cuff Size: Normal)   Pulse 81   Ht 5\' 8"  (1.727 m)   Wt 177 lb (80.3 kg)   SpO2 96%   BMI 26.91 kg/m     Wt Readings from Last 3 Encounters:  02/16/21 177 lb (80.3 kg)  12/14/20 182 lb (82.6 kg)  08/05/20 181 lb (82.1 kg)     GEN:  Well nourished, well developed in no acute distress appears younger than stated age 78: Normal NECK: No JVD; No carotid  bruits LYMPHATICS: No lymphadenopathy CARDIAC: RRR, no murmurs, rubs, gallops RESPIRATORY:  Clear to auscultation without rales, wheezing or rhonchi  ABDOMEN: Soft, non-tender, non-distended MUSCULOSKELETAL:  No edema; No deformity  SKIN: Warm and dry NEUROLOGIC:  Alert and oriented x 3 PSYCHIATRIC:  Normal affect   ASSESSMENT:    1. Atrial tachycardia ([place])  2. Wide-complex tachycardia ([place])   3. PVC (premature ventricular contraction)    PLAN:    In order of problems listed above:  Frequent PVCs Asymptomatic.  Normal left ventricular function. I suspect his symptoms are unrelated to his PVCs.  2.  Tachycardias Highly symptomatic.  Rates are on average around 150 bpm.  The rhythm is regular.  Comes on at rest or with exertion and last hours at a time.  Not associated with syncope or presyncope.  I suspect an aberrantly conducted SVT.  Less likely would be a sustained ventricular tachycardia from the outflow tract.  I recommended that we do a 14-day ZIO monitor to more thoroughly assess these tachycardias.  We will let the results of this ZIO monitor guide neck steps.  If he is clearly having an SVT, would favor an EP study with possible ablation given his preference to avoid long-term medication use.  Follow-up 5 to 6 weeks to review results of ZIO and determine next steps.  Total time spent with patient today 45 minutes. This includes reviewing records, evaluating the patient and coordinating care.   Medication Adjustments/Labs and Tests Ordered: Current medicines are reviewed at length with the patient today.  Concerns regarding medicines are outlined above.  Orders Placed This Encounter  Procedures   LONG TERM MONITOR (3-14 DAYS)   EKG 12-Lead   No orders of the defined types were placed in this encounter.    Signed, Lars Mage, MD, [facility], [facility] 02/16/2021 3:17 PM    Electrophysiology [facility]

## 2021-02-16 NOTE — Patient Instructions (Signed)
Medication Instructions:  Your physician recommends that you continue on your current medications as directed. Please refer to the Current Medication list given to you today.  *If you need a refill on your cardiac medications before your next appointment, please call your pharmacy*   Lab Work: None ordered   Testing/Procedures:                           ZIO XT- Long Term Monitor Instructions  Your physician has requested you wear a ZIO patch monitor for 14 days.  This is a single patch monitor. [employer] supplies one patch monitor per enrollment. Additional stickers are not available. Please do not apply patch if you will be having a Nuclear Stress Test,  Echocardiogram, Cardiac CT, MRI, or Chest Xray during the period you would be wearing the  monitor. The patch cannot be worn during these tests. You cannot remove and re-apply the  ZIO XT patch monitor.  Your ZIO patch monitor will be mailed 3 day USPS to your address on file. It may take 3-5 days  to receive your monitor after you have been enrolled.  Once you have received your monitor, please review the enclosed instructions. Your monitor  has already been registered assigning a specific monitor serial # to you.  Billing and Patient Assistance Program Information  We have supplied [employer] with any of your insurance information on file for billing purposes. [employer] offers a sliding scale Patient Assistance Program for patients that do not have  insurance, or whose insurance does not completely cover the cost of the ZIO monitor.  You must apply for the Patient Assistance Program to qualify for this discounted rate.  To apply, please call [employer] at [phone], select option 4, select option 2, ask to apply for  Patient Assistance Program. [employer] will ask your household income, and how many people  are in your household. They will quote your out-of-pocket cost based on that information.  [employer] will also be able to set up a  12-month, interest-free payment plan if needed.  Applying the monitor   Shave hair from upper left chest.  Hold abrader disc by orange tab. Rub abrader in 40 strokes over the upper left chest as  indicated in your monitor instructions.  Clean area with 4 enclosed alcohol pads. Let dry.  Apply patch as indicated in monitor instructions. Patch will be placed under collarbone on left  side of chest with arrow pointing upward.  Rub patch adhesive wings for 2 minutes. Remove white label marked "1". Remove the white  label marked "2". Rub patch adhesive wings for 2 additional minutes.  While looking in a mirror, press and release button in center of patch. A small green light will  flash 3-4 times. This will be your only indicator that the monitor has been turned on.  Do not shower for the first 24 hours. You may shower after the first 24 hours.  Press the button if you feel a symptom. You will hear a small click. Record Date, Time and  Symptom in the Patient Logbook.  When you are ready to remove the patch, follow instructions on the last 2 pages of Patient  Logbook. Stick patch monitor onto the last page of Patient Logbook.  Place Patient Logbook in the blue and white box. Use locking tab on box and tape box closed  securely. The blue and white box has prepaid postage on it. Please place it in the   mailbox as  soon as possible. Your physician should have your test results approximately 7 days after the  monitor has been mailed back to [facility].  Call [facility] at [phone] if you have questions regarding  your ZIO XT patch monitor. Call them immediately if you see an orange light blinking on your  monitor.  If your monitor falls off in less than 4 days, contact our Monitor department at [phone].  If your monitor becomes loose or falls off after 4 days call [employer] at [phone] for  suggestions on securing your monitor     Follow-Up: At [facility], you and your health needs are our priority.  As part of our continuing mission to provide you with exceptional heart care, we have created designated Provider Care Teams.  These Care Teams include your primary Cardiologist (physician) and Advanced Practice Providers (APPs -  Physician Assistants and Nurse Practitioners) who all work together to provide you with the care you need, when you need it.   Your next appointment:   6 week(s)  The format for your next appointment:   In Person  Provider:   Lars Mage, MD    Thank you for choosing [facility]!!

## 2021-02-28 DIAGNOSIS — I471 Supraventricular tachycardia: Secondary | ICD-10-CM

## 2021-03-01 ENCOUNTER — Other Ambulatory Visit: Payer: Self-pay

## 2021-03-01 ENCOUNTER — Ambulatory Visit
Admission: RE | Admit: 2021-03-01 | Discharge: 2021-03-01 | Disposition: A | Discharge status: Home / Self Care | Payer: Medicare HMO | Source: Ambulatory Visit | Attending: Internal Medicine | Admitting: Internal Medicine

## 2021-03-01 DIAGNOSIS — E042 Nontoxic multinodular goiter: Secondary | ICD-10-CM | POA: Diagnosis not present

## 2021-03-01 DIAGNOSIS — E041 Nontoxic single thyroid nodule: Secondary | ICD-10-CM

## 2021-03-06 NOTE — Telephone Encounter (Signed)
Lets wait until the followup  Thanks SK

## 2021-03-07 DIAGNOSIS — I471 Supraventricular tachycardia: Secondary | ICD-10-CM | POA: Diagnosis not present

## 2021-03-15 ENCOUNTER — Other Ambulatory Visit: Payer: Medicare HMO

## 2021-03-15 ENCOUNTER — Other Ambulatory Visit: Payer: Self-pay

## 2021-03-15 ENCOUNTER — Other Ambulatory Visit (INDEPENDENT_AMBULATORY_CARE_PROVIDER_SITE_OTHER): Payer: Medicare HMO

## 2021-03-15 DIAGNOSIS — E538 Deficiency of other specified B group vitamins: Secondary | ICD-10-CM | POA: Diagnosis not present

## 2021-03-15 DIAGNOSIS — I493 Ventricular premature depolarization: Secondary | ICD-10-CM

## 2021-03-15 DIAGNOSIS — E785 Hyperlipidemia, unspecified: Secondary | ICD-10-CM | POA: Diagnosis not present

## 2021-03-15 LAB — CBC WITH DIFFERENTIAL/PLATELET
Basophils Absolute: 0 10*3/uL (ref 0.0–0.1)
Basophils Relative: 0.4 % (ref 0.0–3.0)
Eosinophils Absolute: 0.1 10*3/uL (ref 0.0–0.7)
Eosinophils Relative: 1.9 % (ref 0.0–5.0)
HCT: 45.8 % (ref 39.0–52.0)
Hemoglobin: 15.7 g/dL (ref 13.0–17.0)
Lymphocytes Relative: 31.5 % (ref 12.0–46.0)
Lymphs Abs: 2.4 10*3/uL (ref 0.7–4.0)
MCHC: 34.3 g/dL (ref 30.0–36.0)
MCV: 92 fl (ref 78.0–100.0)
Monocytes Absolute: 0.9 10*3/uL (ref 0.1–1.0)
Monocytes Relative: 11.2 % (ref 3.0–12.0)
Neutro Abs: 4.2 10*3/uL (ref 1.4–7.7)
Neutrophils Relative %: 55 % (ref 43.0–77.0)
Platelets: 160 10*3/uL (ref 150.0–400.0)
RBC: 4.98 Mil/uL (ref 4.22–5.81)
RDW: 13.7 % (ref 11.5–15.5)
WBC: 7.6 10*3/uL (ref 4.0–10.5)

## 2021-03-15 LAB — COMPREHENSIVE METABOLIC PANEL
ALT: 21 U/L (ref 0–53)
AST: 19 U/L (ref 0–37)
Albumin: 4.4 g/dL (ref 3.5–5.2)
Alkaline Phosphatase: 56 U/L (ref 39–117)
BUN: 20 mg/dL (ref 6–23)
CO2: 24 mEq/L (ref 19–32)
Calcium: 9 mg/dL (ref 8.4–10.5)
Chloride: 103 mEq/L (ref 96–112)
Creatinine, Ser: 0.87 mg/dL (ref 0.40–1.50)
GFR: 86.38 mL/min (ref >60.00)
Glucose, Bld: 88 mg/dL (ref 70–99)
Potassium: 3.9 mEq/L (ref 3.5–5.1)
Sodium: 137 mEq/L (ref 135–145)
Total Bilirubin: 0.6 mg/dL (ref 0.2–1.2)
Total Protein: 6.3 g/dL (ref 6.0–8.3)

## 2021-03-15 LAB — LIPID PANEL
Cholesterol: 197 mg/dL (ref 0–200)
HDL: 42.7 mg/dL (ref >39.00)
LDL Cholesterol: 115 mg/dL — ABNORMAL HIGH (ref 0–99)
NonHDL: 154.35
Total CHOL/HDL Ratio: 5
Triglycerides: 196 mg/dL — ABNORMAL HIGH (ref 0.0–149.0)
VLDL: 39.2 mg/dL (ref 0.0–40.0)

## 2021-03-15 LAB — VITAMIN B12: Vitamin B-12: 293 pg/mL (ref 211–911)

## 2021-03-30 ENCOUNTER — Other Ambulatory Visit: Payer: Self-pay

## 2021-03-30 ENCOUNTER — Encounter: Payer: Self-pay | Admitting: Cardiology

## 2021-03-30 ENCOUNTER — Ambulatory Visit: Payer: Medicare HMO | Admitting: Cardiology

## 2021-03-30 VITALS — BP 132/78 | HR 92 | Ht 68.0 in | Wt 175.0 lb

## 2021-03-30 DIAGNOSIS — I471 Supraventricular tachycardia: Secondary | ICD-10-CM | POA: Diagnosis not present

## 2021-03-30 DIAGNOSIS — I10 Essential (primary) hypertension: Secondary | ICD-10-CM

## 2021-03-30 DIAGNOSIS — I493 Ventricular premature depolarization: Secondary | ICD-10-CM

## 2021-03-30 DIAGNOSIS — I472 Ventricular tachycardia, unspecified: Secondary | ICD-10-CM

## 2021-03-30 NOTE — Patient Instructions (Addendum)
Medication Instructions:  Your physician recommends that you continue on your current medications as directed. Please refer to the Current Medication list given to you today. *If you need a refill on your cardiac medications before your next appointment, please call your pharmacy*  Lab Work: None ordered. If you have labs (blood work) drawn today and your tests are completely normal, you will receive your results only by: [place] (if you have MyChart) OR A paper copy in the mail If you have any lab test that is abnormal or we need to change your treatment, we will call you to review the results.  Testing/Procedures: None ordered.  Follow-Up: At [facility], you and your health needs are our priority.  As part of our continuing mission to provide you with exceptional heart care, we have created designated Provider Care Teams.  These Care Teams include your primary Cardiologist (physician) and Advanced Practice Providers (APPs -  Physician Assistants and Nurse Practitioners) who all work together to provide you with the care you need, when you need it.  Your next appointment:    The following dates are available for an ablation in December: December 2, 9, 12, 16, 27, 30

## 2021-03-30 NOTE — Progress Notes (Signed)
Electrophysiology Office Follow up Visit Note:    Date:  03/30/2021   ID:  James Carrillo, DOB 12-Nov-1950, MRN [id number]  PCP:  McLean-Scocuzza, Nino Glow, MD  [facility] Cardiologist:  Kathlyn Sacramento, MD  [place] Electrophysiologist:  None    Interval History:    James Carrillo is a 70 y.o. male who presents for a follow up visit. They were last seen in clinic February 16, 2021 for SVT and frequent PVCs.  At that appointment we ordered a 2-week ZIO monitor to confirm our suspicion that his symptoms were related to SVT and not his frequent PVCs.     Past Medical History:  Diagnosis Date   Arthritis    Atrial fibrillation ([place])    per pt intermittent since 2003    Biceps tendinitis of right shoulder    [facility] [place] 12/01/20   Claustrophobia    COVID-19    07/2020 x 5 days bad cold   Dysrhythmia    Multiple thyroid nodules    Palpitations    PVC's (premature ventricular contractions)    Rotator cuff tear    s/p surgery in 2000 now has tear per [facility] orth[facility] Dr. Edd Fabian last steroid shot (right)    Past Surgical History:  Procedure Laterality Date   KNEE SURGERY Left    left shoulder surgery     2021 [facility] Dr. Raliegh Ip    MOLE REMOVAL  2004   Dr. Collene Schlichter   SHOULDER SURGERY Right 1999/2000   x1   TONSILLECTOMY     1956    Current Medications: Current Meds  Medication Sig   Lysine 500 MG TABS Take 500 mg by mouth daily.    Multiple Vitamins-Minerals (MULTIVITAMIN ADULT PO) Take 1 tablet by mouth daily.    Red Yeast Rice 600 MG CAPS Take 600 mg by mouth in the morning and at bedtime.    verapamil (CALAN-SR) 120 MG CR tablet Take 1 tablet (120 mg total) by mouth daily.     Allergies:   Other and Wheat bran   Social History   Socioeconomic History   Marital status: Single    Spouse name: Not on file   Number of children: Not on file   Years of education: Not on file   Highest education level: Not on file  Occupational History   Not  on file  Tobacco Use   Smoking status: Never   Smokeless tobacco: Never  Vaping Use   Vaping Use: Never used  Substance and Sexual Activity   Alcohol use: Yes    Comment: socially   Drug use: No   Sexual activity: Yes    Comment: women   Other Topics Concern   Not on file  Social History Narrative   Masters, professor    Single has adult kids sons    Social Determinants of Radio broadcast assistant Strain: Low Risk    Difficulty of Paying Living Expenses: Not hard at all  Food Insecurity: No Food Insecurity   Worried About Charity fundraiser in the Last Year: Never true   Arboriculturist in the Last Year: Never true  Transportation Needs: No Transportation Needs   Lack of Transportation (Medical): No   Lack of Transportation (Non-Medical): No  Physical Activity: Not on file  Stress: No Stress Concern Present   Feeling of Stress : Not at all  Social Connections: Not on file     Family History: The patient's  family history includes Arthritis in his father and mother; Cancer in his father; Heart disease in his father and mother; Melanoma in his father; Renal Disease in his paternal grandmother; Stroke in his mother.  ROS:   Please see the history of present illness.    All other systems reviewed and are negative.  EKGs/Labs/Other Studies Reviewed:    The following studies were reviewed today:   March 10, 2021 ZIO monitor personally reviewed HR 59 - 222, average 83bpm. Intermittent bundle branch block. 180 episodes of SVT, lasting up to 4 hours 47 minutes. Patient triggered episodes correspond to SVT. PVC very frequent, 44%.   EKG:  The ekg ordered today demonstrates sinus rhythm with bigeminal monomorphic PVCs.  PVCs have a steeply inferior axis with a precordial transition in V3 which precedes the sinus transition.  Recent Labs: 06/28/2020: TSH 1.27 03/15/2021: ALT 21; BUN 20; Creatinine, Ser 0.87; Hemoglobin 15.7; Platelets 160.0; Potassium 3.9; Sodium 137   Recent Lipid Panel    Component Value Date/Time   CHOL 197 03/15/2021 0821   TRIG 196.0 (H) 03/15/2021 0821   HDL 42.70 03/15/2021 0821   CHOLHDL 5 03/15/2021 0821   VLDL 39.2 03/15/2021 0821   LDLCALC 115 (H) 03/15/2021 0821   LDLDIRECT 95.0 08/20/2019 0937    Physical Exam:    VS:  BP 132/78 (BP Location: Left Arm, Patient Position: Sitting, Cuff Size: Normal)   Pulse 92   Ht '5\' 8"'$  (1.727 m)   Wt 175 lb (79.4 kg)   SpO2 95%   BMI 26.61 kg/m     Wt Readings from Last 3 Encounters:  03/30/21 175 lb (79.4 kg)  02/16/21 177 lb (80.3 kg)  12/14/20 182 lb (82.6 kg)     GEN:  Well nourished, well developed in no acute distress HEENT: Normal NECK: No JVD; No carotid bruits LYMPHATICS: No lymphadenopathy CARDIAC: RRR, no murmurs, rubs, gallops RESPIRATORY:  Clear to auscultation without rales, wheezing or rhonchi  ABDOMEN: Soft, non-tender, non-distended MUSCULOSKELETAL:  No edema; No deformity  SKIN: Warm and dry NEUROLOGIC:  Alert and oriented x 3 PSYCHIATRIC:  Normal affect   ASSESSMENT:    1. SVT (supraventricular tachycardia) ([place])   2. PVC (premature ventricular contraction)   3. Ventricular tachycardia ([place])   4. Essential hypertension    PLAN:    In order of problems listed above:   1. SVT (supraventricular tachycardia) (HCC) Symptomatic SVT.  Some are aberrantly conducted.  I discussed treatment options for the SVT including antiarrhythmic drug therapy and EP study with ablation.  Given the highly symptomatic nature of the SVT episodes and his desire to avoid medications given his susceptibility to off target effects, I think he would be a very good candidate for EP study with ablation.  I discussed the risks, recovery and likelihood of success for EP study and ablation of SVT and he wishes to proceed with scheduling.  During the EP study, I would not plan on targeting the PVC.  He needs to be off of his calcium channel blocker for at least 1 week prior to  the procedure.  Therapeutic strategies for supraventricular tachycardia including medicine and ablation were discussed in detail with the patient today. Risk, benefits, and alternatives to EP study and radiofrequency ablation were also discussed in detail today. These risks include but are not limited to stroke, bleeding, vascular damage, tamponade, perforation, damage to the heart and other structures, AV block requiring pacemaker, worsening renal function, and death. The patient understands these risk  and wishes to proceed.  We will therefore proceed with catheter ablation at the next available time.   2. PVC (premature ventricular contraction) 3. Ventricular tachycardia (HCC) Asymptomatic  4. Essential hypertension Controlled.  Continue exercise.     Medication Adjustments/Labs and Tests Ordered: Current medicines are reviewed at length with the patient today.  Concerns regarding medicines are outlined above.  No orders of the defined types were placed in this encounter.  No orders of the defined types were placed in this encounter.    Signed, Lars Mage, MD, [facility], [facility] 03/30/2021 1:47 PM    Electrophysiology [facility]

## 2021-04-07 ENCOUNTER — Ambulatory Visit: Payer: Medicare HMO | Admitting: Internal Medicine

## 2021-04-17 IMAGING — US US THYROID
1 series · 13 of 25 positions shown · non-contrast
Comparison: Prior thyroid ultrasound 10/19/2017

CLINICAL DATA: Goiter.

EXAM:
THYROID ULTRASOUND
TECHNIQUE: Ultrasound examination of the thyroid gland and adjacent soft
tissues was performed.

[Series 1: us thyroid · 0.07mm/px · 13 of 46 slices shown]
[im 1/46]
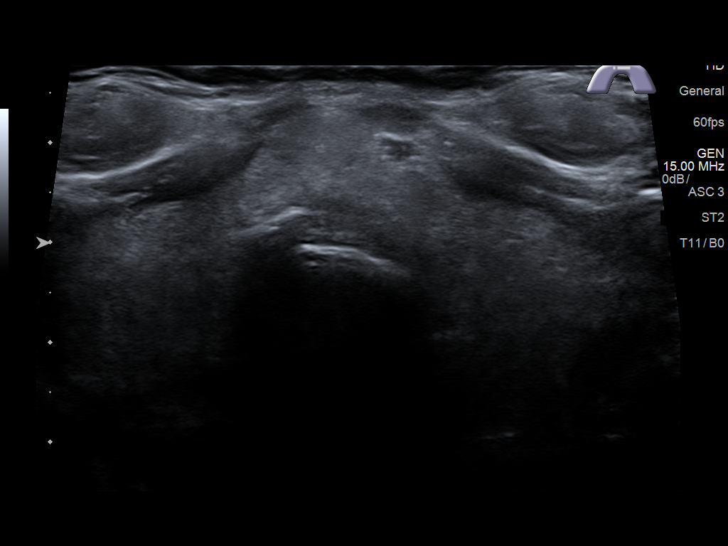
[im 4/46]
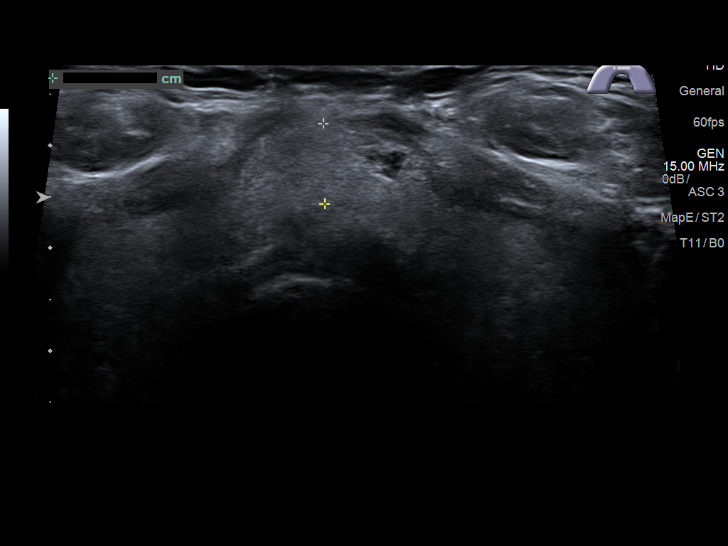
[im 8/46]
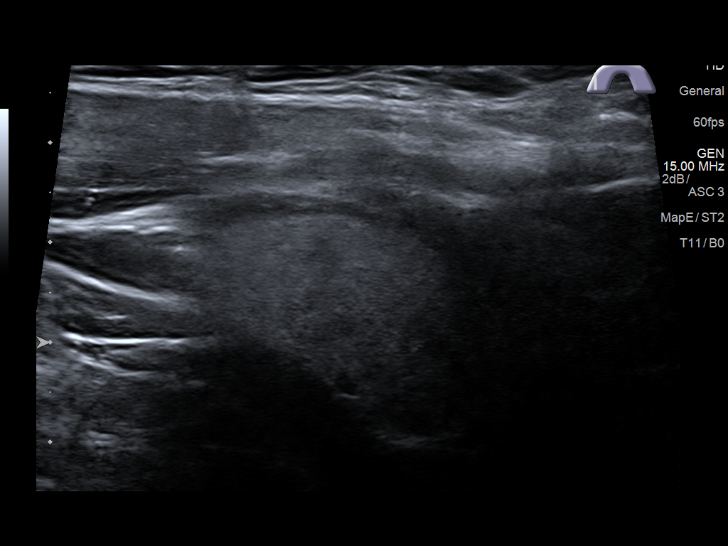
[im 12/46]
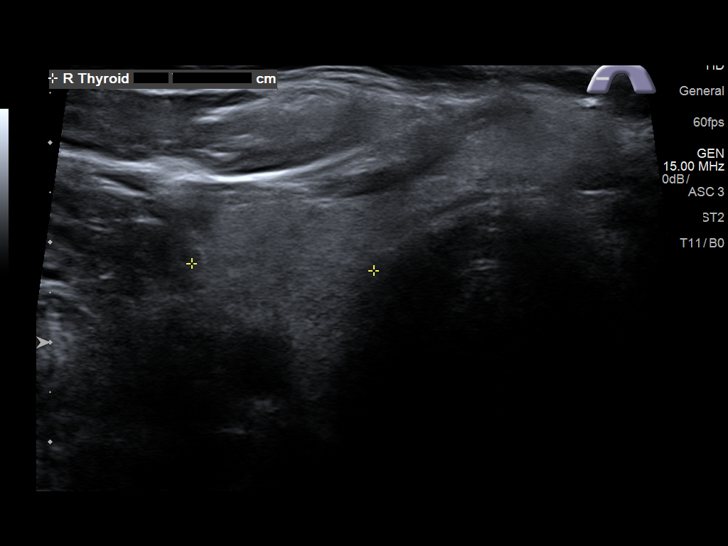
[im 16/46]
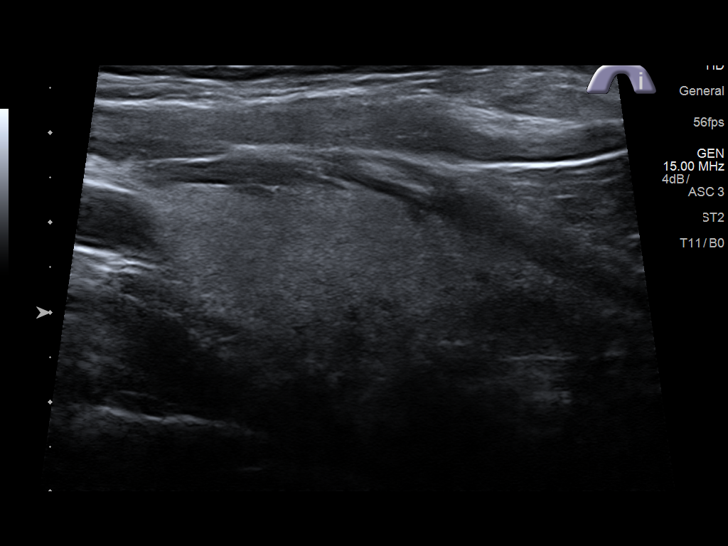
[im 19/46]
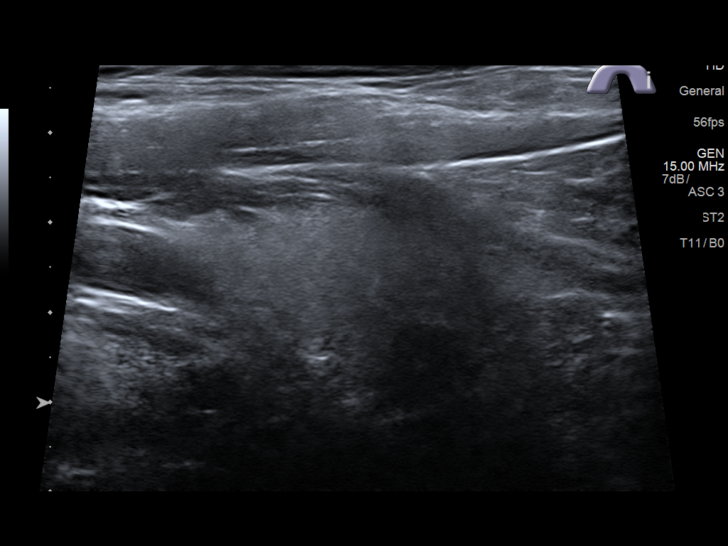
[im 23/46]
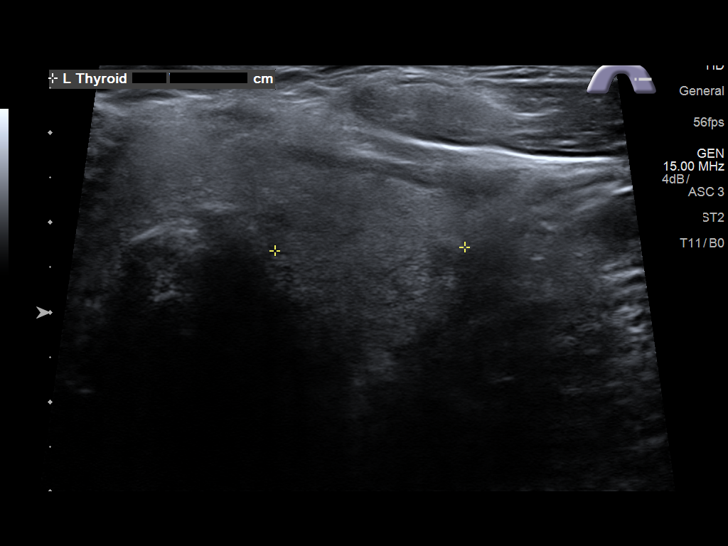
[im 27/46]
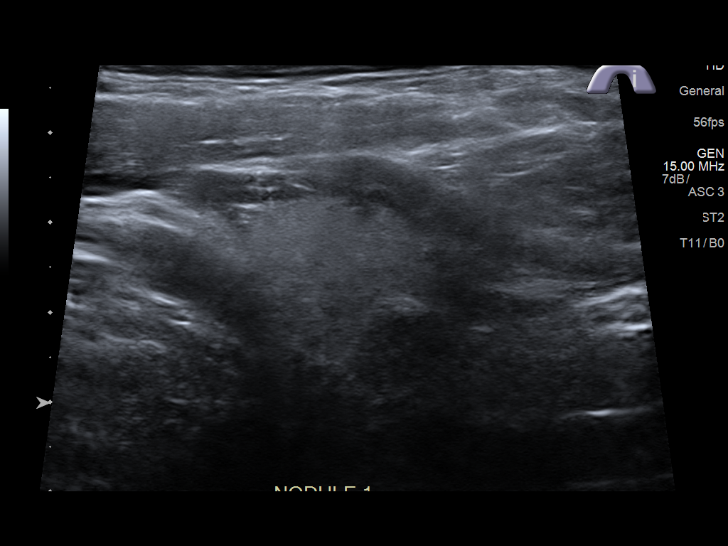
[im 31/46]
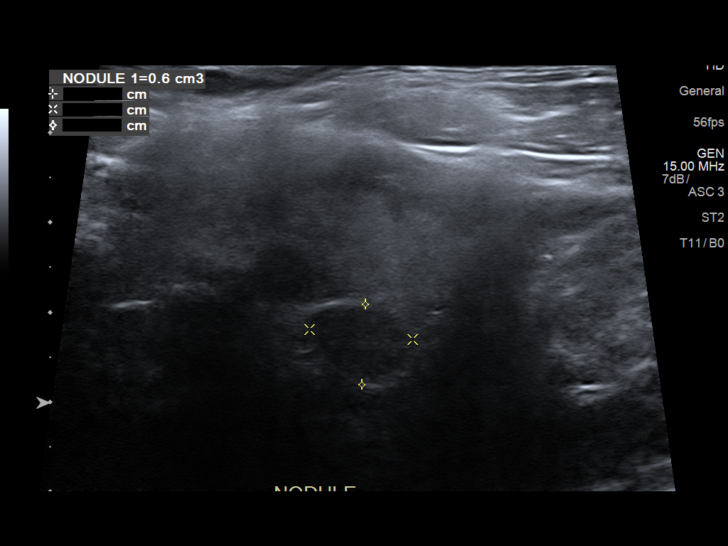
[im 34/46]
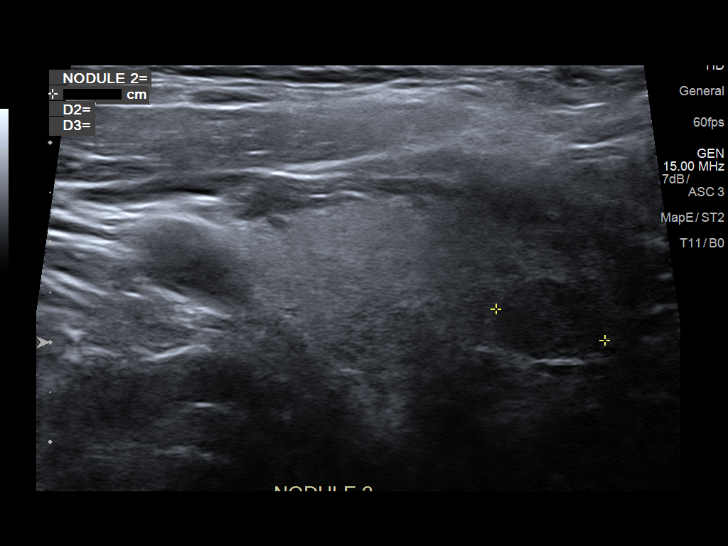
[im 38/46]
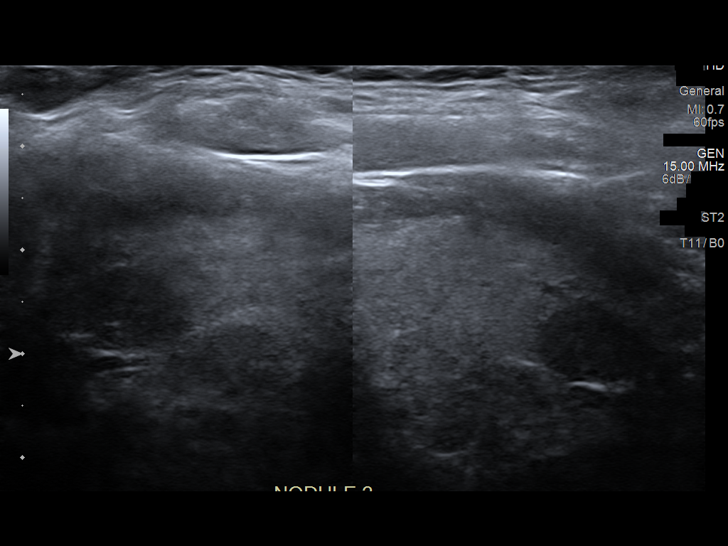
[im 42/46]
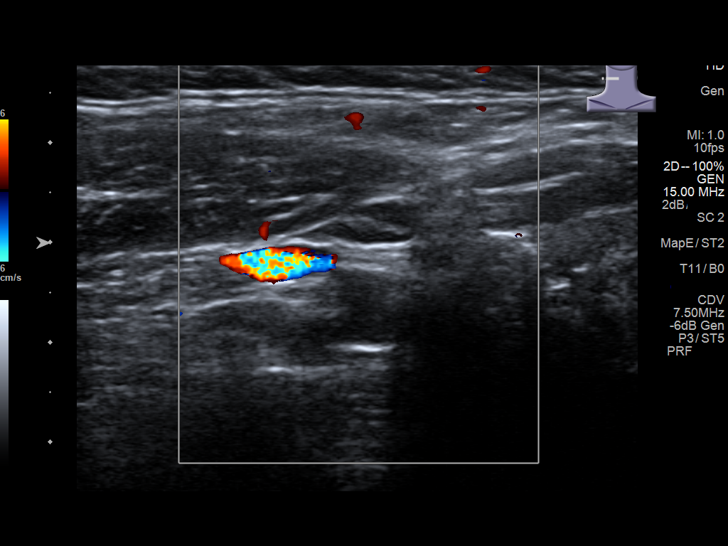
[im 46/46]
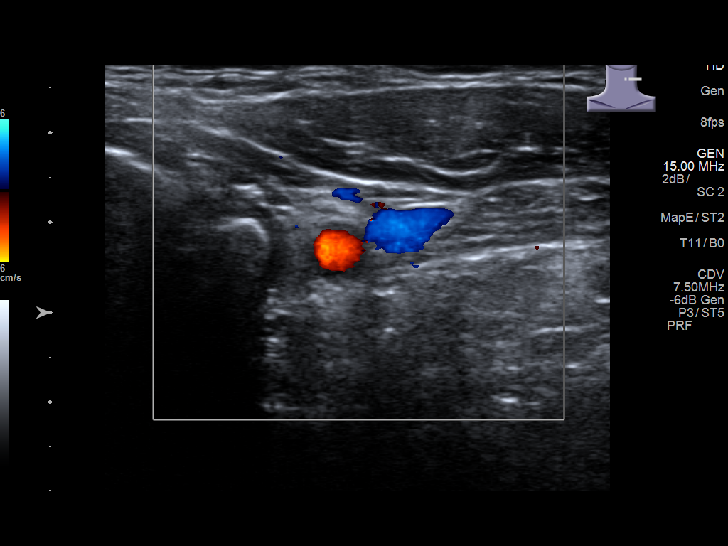

[13 of 25 positions shown; findings below may reference images not displayed]

FINDINGS: Parenchymal Echotexture: Mildly heterogenous

Isthmus: 0.8 cm

Right lobe: 4.8 x 2.1 x 1.8 cm

Left lobe: 5.4 x 2.2 x 2.1 cm

_________________________________________________________

Estimated total number of nodules >/= 1 cm: 2

Number of spongiform nodules >/=  2 cm not described below (TR1): 0

Number of mixed cystic and solid nodules >/= 1.5 cm not described
below (TR2): 0

_________________________________________________________

Nodule # 1:

Prior biopsy: No

Location: Left; Inferior

Maximum size: 1.2 cm; Other 2 dimensions: 1.1 x 0.9 cm, previously,
1.0 x 0.9 x 0.9 cm

Composition: solid/almost completely solid (2)

Echogenicity: hypoechoic (2)

Shape: not taller-than-wide (0)

Margins: smooth (0)

Echogenic foci: none (0)

ACR TI-RADS total points: 4.

ACR TI-RADS risk category:  TR4 (4-6 points).

Significant change in size (>/= 20% in two dimensions and minimal
increase of 2 mm): No

Change in features: No

Change in ACR TI-RADS risk category: No

ACR TI-RADS recommendations:

*Given size (>/= 1 - 1.4 cm) and appearance, a follow-up ultrasound
in 1 year should be considered based on TI-RADS criteria.

_________________________________________________________

Nodule # 2:

Prior biopsy: No

Location: Left; Inferior

Maximum size: 1.1 cm; Other 2 dimensions: 1.0 x 0.8 cm, previously,
1.0 x 0.7 x 0.8 cm

Composition: solid/almost completely solid (2)

Echogenicity: hypoechoic (2)

Shape: not taller-than-wide (0)

Margins: smooth (0)

Echogenic foci: none (0)

ACR TI-RADS total points: 4.

ACR TI-RADS risk category:  TR4 (4-6 points).

Significant change in size (>/= 20% in two dimensions and minimal
increase of 2 mm): No

Change in features: No

Change in ACR TI-RADS risk category: No

ACR TI-RADS recommendations:

*Given size (>/= 1 - 1.4 cm) and appearance, a follow-up ultrasound
in 1 year should be considered based on TI-RADS criteria.

_________________________________________________________
IMPRESSION: Minimal interval change in the size or appearance of the 2 small
TI-RADS category 4 nodules both located in the left inferior gland.
Both nodules continue to meet criteria for annual surveillance until
5 year stability has been confirmed. Recommend follow-up ultrasound
in 1 year.

The above is in keeping with the ACR TI-RADS recommendations - [HOSPITAL] 0194;[DATE].

## 2021-04-21 IMAGING — US US THYROID
2 series · 13 of 25 positions shown · non-contrast
Comparison: 12/16/2018

CLINICAL DATA: 69-year-old male with a history of thyroid nodules

EXAM:
THYROID ULTRASOUND
TECHNIQUE: Ultrasound examination of the thyroid gland and adjacent soft
tissues was performed.

[Series 1: us thyroid · 0.08mm/px · 12 of 41 slices shown (1 of 2)]
[im 1/41]
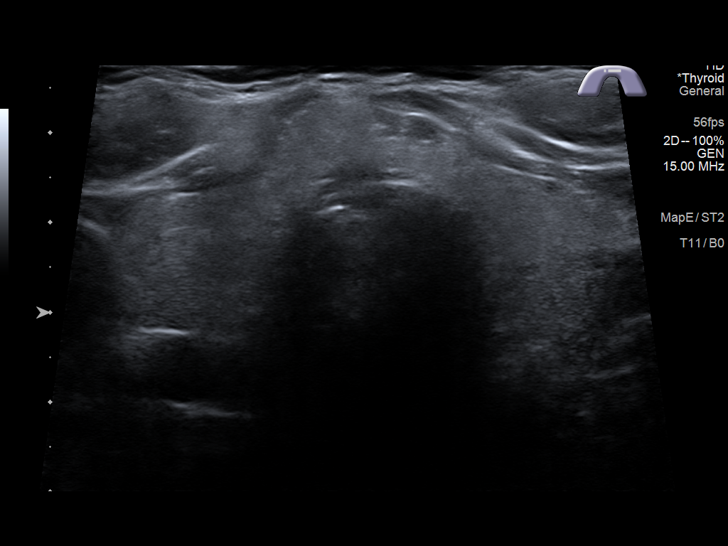
[im 4/41]
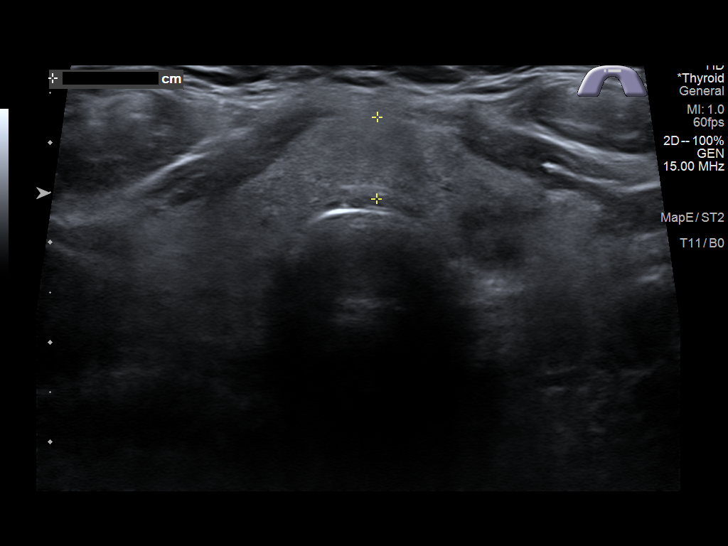
[im 7/41]
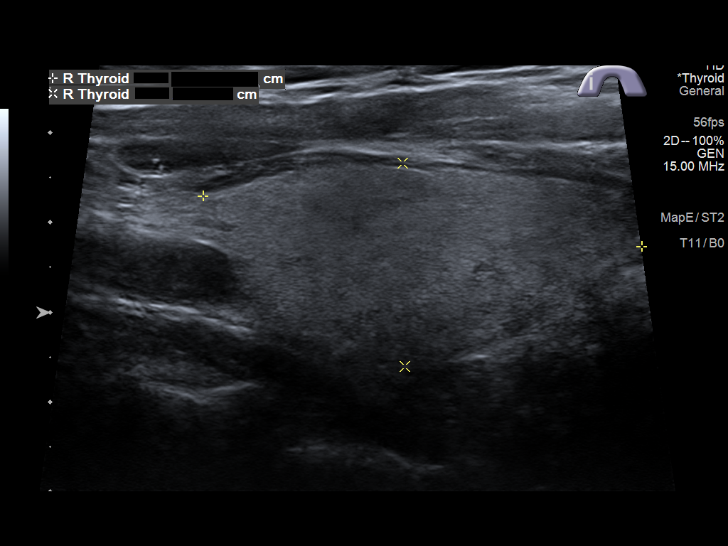
[im 11/41]
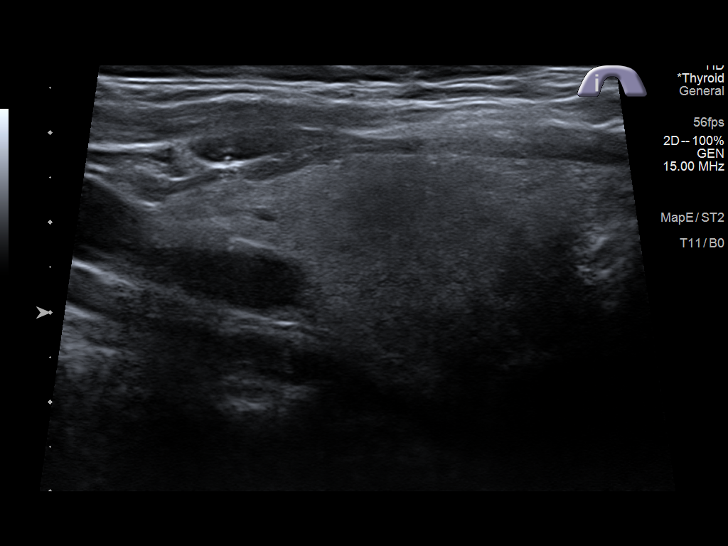
[im 14/41]
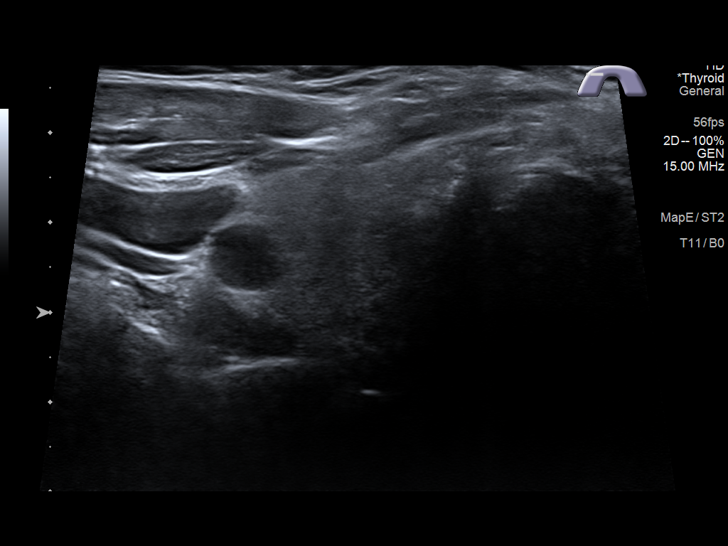
[im 18/41]
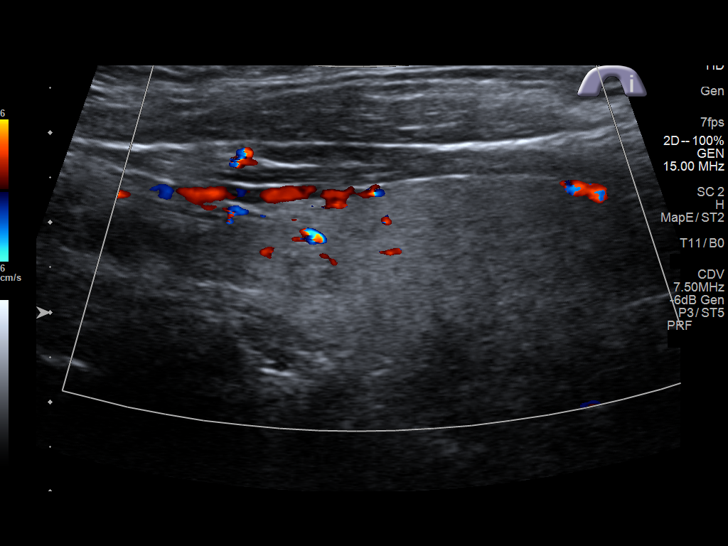
[im 21/41]
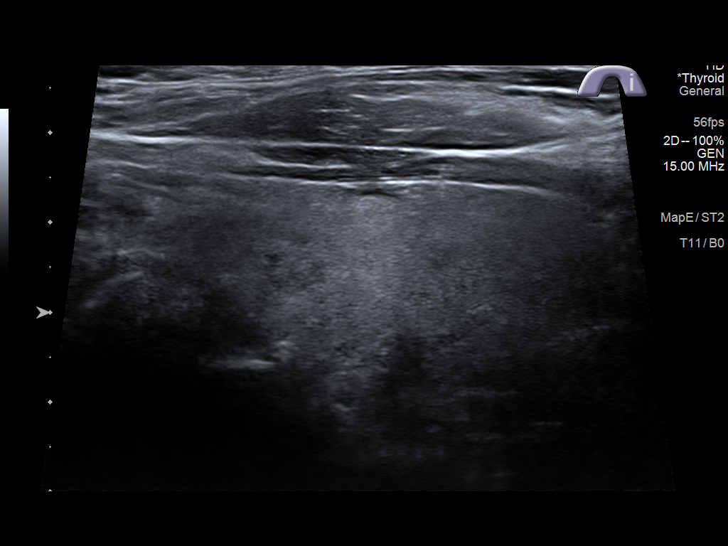
[im 25/41]
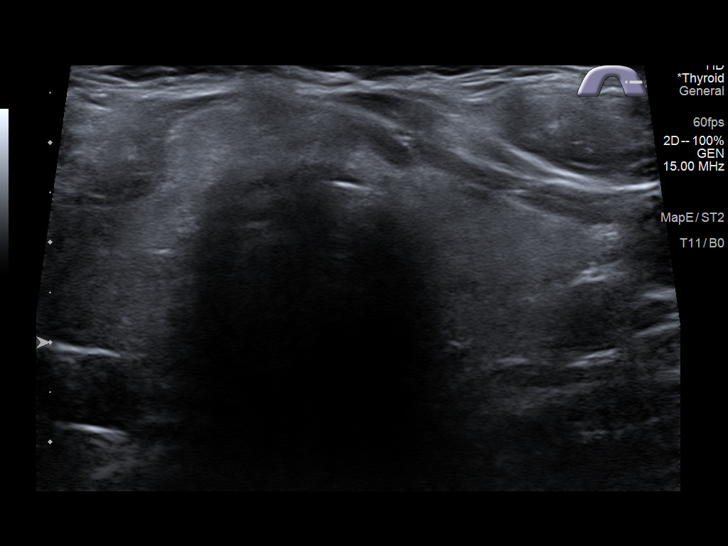
[im 28/41]
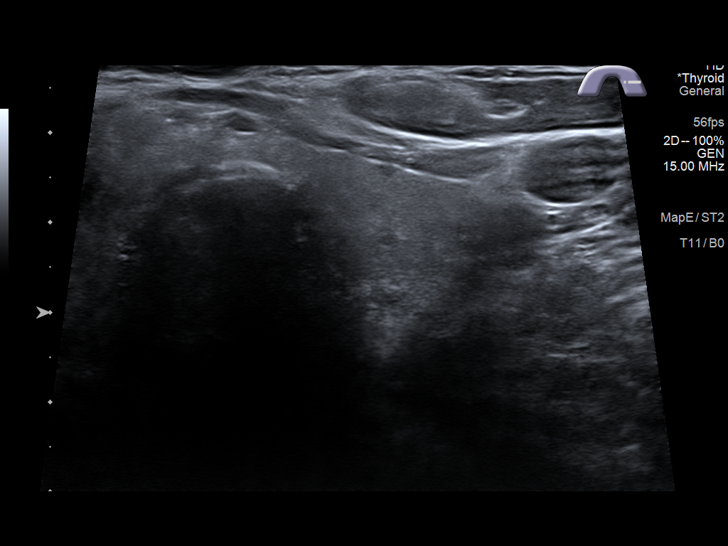
[im 32/41]
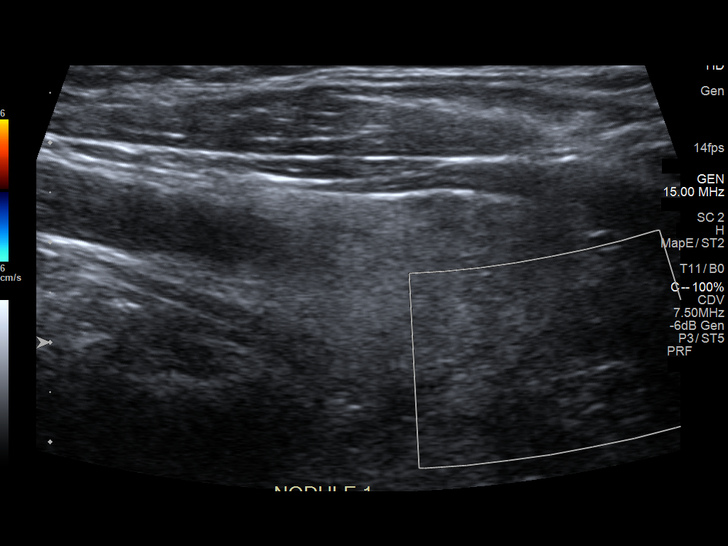
[im 35/41]
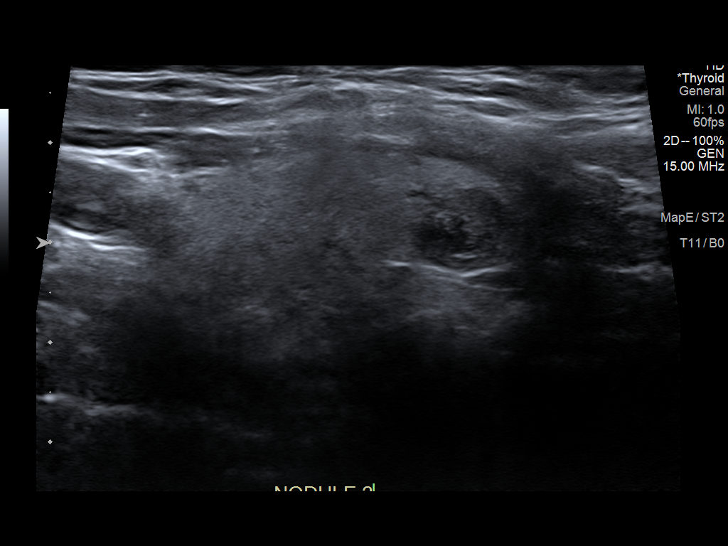
[im 39/41]
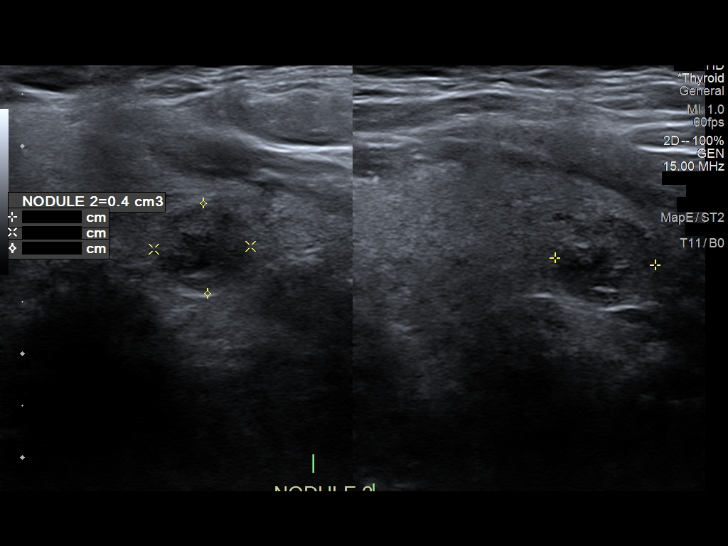

[Series 2001: us thyroid · 0.08mm/px · 1 of 1 slices shown (2 of 2)]
[im 1/1]
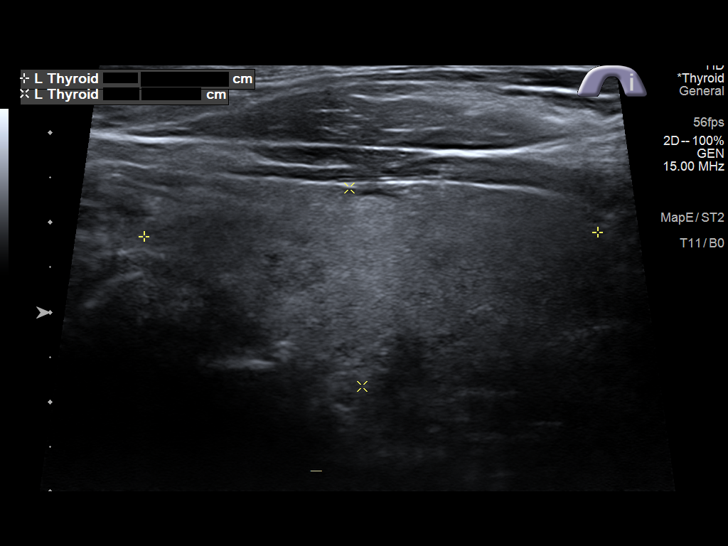

[13 of 25 positions shown; findings below may reference images not displayed]

FINDINGS: Parenchymal Echotexture: Mildly heterogenous

Isthmus: 0.8 cm

Right lobe: 4.9 cm x 2.3 cm x 1.7 cm

Left lobe: 5.0 cm x 2.2 cm x 1.7 cm

_________________________________________________________

Estimated total number of nodules >/= 1 cm: 2

Number of spongiform nodules >/=  2 cm not described below (TR1): 0

Number of mixed cystic and solid nodules >/= 1.5 cm not described
below (TR2): 0

_________________________________________________________

Nodule labeled 1 inferior left thyroid, measuring slightly smaller
than the prior, previously 1.2 cm now 1.0 cm. This remains TR 4 and
meets criteria for continued surveillance.

Nodule labeled 2 inferior left thyroid, measures slightly smaller
than prior, previously 1.1 cm, now 1.0 cm. This remains TR 4 and
meets criteria for continued surveillance.

No adenopathy
IMPRESSION: Left inferior thyroid nodule (labeled 1, TR 4, 1.0 cm) and the left
inferior thyroid nodule (labeled 2, TR 4, 1.0 cm) both meet criteria
for surveillance, as designated by the newly established ACR TI-RADS
criteria. Surveillance ultrasound study recommended to be performed
annually up to 5 years.

Recommendations follow those established by the new ACR TI-RADS
criteria ([HOSPITAL] 8189;[DATE]).

## 2021-04-25 NOTE — Progress Notes (Signed)
04/27/21 7:50 AM   James Carrillo 1950-11-04 [id number]  Referring provider:  McLean-Scocuzza, Nino Glow, MD [employer],  [place] [id number] No chief complaint on file.    HPI: James Carrillo is a 70 y.o.male who presents today to further evaluate prostate issues.  He reports that these are longstanding since his 26s but seem to be slowly progressing over the years.  He is not on any current medications for his bladder or prostate.  Prefers to avoid medications in general.  He was on Flomax in the remote past but ended up stopping this medication due to his history as of SVT and concern for worsening of his tachycardia.  He states today he has always experienced nocturia.  He does snore.  He has never had a sleep study.  He had surgery to get a blockage out of his urethra and was diagnosed with a outpouching of his urethra 1978.  He reports that the surgery was performed through the penile shaft near the glans and the did not have any effect on his urinary symptoms whatsoever.  He also had a cystoscopy in the operating room remotely with Dr. Ernst Spell.    He had a back injury that he did not have surgery for and believed there is a correlation he was 26 at the time.  He reports a history of sacral dysfunction.  His most recent PSA  in 06/2020 was 0.89.  He denies any bowel issues.   IPSS     Row Name 04/26/21 1000         International Prostate Symptom Score   How often have you had the sensation of not emptying your bladder? Almost always     How often have you had to urinate less than every two hours? More than half the time     How often have you found you stopped and started again several times when you urinated? More than half the time     How often have you found it difficult to postpone urination? About half the time     How often have you had a weak urinary stream? More than half the time     How often have you had to strain to start urination? Not at  All     How many times did you typically get up at night to urinate? 5 Times     Total IPSS Score 25           Quality of Life due to urinary symptoms   If you were to spend the rest of your life with your urinary condition just the way it is now how would you feel about that? Unhappy              Score:  1-7 Mild 8-19 Moderate 20-35 Severe    PSA trend:  Component     Latest Ref Rng & Units 02/08/2018 02/13/2019 06/28/2020  PSA     0.10 - 4.00 ng/mL 1.24 1.08 0.89     PMH: Past Medical History:  Diagnosis Date   Arthritis    Atrial fibrillation ([place])    per pt intermittent since 2003    Biceps tendinitis of right shoulder    [facility] 12[facility]20   Claustrophobia    COVID-19    07/2020 x 5 days bad cold   Dysrhythmia    Multiple thyroid nodules    Palpitations    PVC's (premature ventricular contractions)    Rotator  cuff tear    s/p surgery in 2000 now has tear per [facility] Dr. Edd Fabian last steroid shot (right)    Surgical History: Past Surgical History:  Procedure Laterality Date   KNEE SURGERY Left    left shoulder surgery     2021 [facility] Dr. Raliegh Ip    MOLE REMOVAL  2004   Dr. Collene Schlichter   SHOULDER SURGERY Right 1999/2000   x1   [place] Medications:  Allergies as of 04/26/2021       Reactions   Other Other (See Comments)   Avoid fluoroquinolones d/t aortic root dilatation    Wheat Bran    WHOLE WHEAT        Medication List        Accurate as of April 26, 2021 11:59 PM. If you have any questions, ask your nurse or doctor.          STOP taking these medications    verapamil 120 MG CR tablet Commonly known as: CALAN-SR Stopped by: Hollice Espy, MD       TAKE these medications    Lysine 500 MG Tabs Take 500 mg by mouth daily.   MULTIVITAMIN ADULT PO Take 1 tablet by mouth daily.   Red Yeast Rice 600 MG Caps Take 600 mg by mouth in the morning and at bedtime.         Allergies:  Allergies  Allergen Reactions   Other Other (See Comments)    Avoid fluoroquinolones d/t aortic root dilatation    Wheat Bran     WHOLE WHEAT    Family History: Family History  Problem Relation Age of Onset   Melanoma Father        hand and arms   Arthritis Father    Cancer Father        throat dx age 54    Heart disease Father        MI age 16 and CABG hx    Arthritis Mother    Stroke Mother        age 59   Heart disease Mother        heart valve replaced older age    Renal Disease Paternal Grandmother        [place]     Social History:  reports that he has never smoked. He has never used smokeless tobacco. He reports current alcohol use. He reports that he does not use drugs.   Physical Exam: BP 138/74   Pulse 68   Ht '5\' 8"'$  (1.727 m)   Wt 172 lb (78 kg)   BMI 26.15 kg/m   Constitutional:  Alert and oriented, No acute distress. HEENT: [place] AT, moist mucus membranes.  Trachea midline, no masses. Cardiovascular: No clubbing, cyanosis, or edema. Respiratory: Normal respiratory effort, no increased work of breathing. Skin: No rashes, bruises or suspicious lesions. Neurologic: Grossly intact, no focal deficits, moving all 4 extremities. Psychiatric: Normal mood and affect.  Laboratory Data:  Lab Results  Component Value Date   CREATININE 0.87 03/15/2021    Lab Results  Component Value Date   PSA 0.89 06/28/2020   PSA 1.08 02/13/2019   PSA 1.24 02/08/2018    Lab Results  Component Value Date   HGBA1C 5.9 06/28/2020    Pertinent Imaging: Results for orders placed or performed in visit on 04/26/21  Microscopic Examination   Urine  Result Value Ref Range   WBC, UA None seen  0 - 5 /hpf   RBC None seen 0 - 2 /hpf   Epithelial Cells (non renal) None seen 0 - 10 /hpf   Bacteria, UA None seen None seen/Few  Urinalysis, Complete  Result Value Ref Range   Specific Gravity, UA 1.020 1.005 - 1.030   pH, UA 5.5 5.0 - 7.5   Color, UA Yellow  Yellow   Appearance Ur Clear Clear   Leukocytes,UA Negative Negative   Protein,UA Negative Negative/Trace   Glucose, UA Negative Negative   Ketones, UA Negative Negative   RBC, UA Negative Negative   Bilirubin, UA Negative Negative   Urobilinogen, Ur 0.2 0.2 - 1.0 mg/dL   Nitrite, UA Negative Negative   Microscopic Examination See below:   Bladder Scan (Post Void Residual) in office  Result Value Ref Range   Scan Result 23       Assessment & Plan:    BPH with LUTS  - long standing both obstructive and irritating urinating symptoms  - most bothered by nocturia  - prefers to avoid medication  -based on his is history ddx includesBPH w obstruction vs. Pelvic floor dysfunction vs urethral stricture - recommend anatomic review with cystoscopy/TRUS - recommend UDS to help differentiate If unclear from the previous workup  - management intervention will be based on further evaluation  - he agrees with this plan   Nocturia  History of urethral abnormality     Conley Rolls as a scribe for Hollice Espy, MD.,have documented all relevant documentation on the behalf of Hollice Espy, MD,as directed by  Hollice Espy, MD while in the presence of Hollice Espy, MD.  I have reviewed the above documentation for accuracy and completeness, and I agree with the above.   Hollice Espy, MD   [facility] [address] [place], [place] [address] [phone]

## 2021-04-26 ENCOUNTER — Encounter: Payer: Self-pay | Admitting: Urology

## 2021-04-26 ENCOUNTER — Other Ambulatory Visit: Payer: Self-pay

## 2021-04-26 ENCOUNTER — Ambulatory Visit: Payer: Medicare HMO | Admitting: Urology

## 2021-04-26 VITALS — BP 138/74 | HR 68 | Ht 68.0 in | Wt 172.0 lb

## 2021-04-26 DIAGNOSIS — R350 Frequency of micturition: Secondary | ICD-10-CM

## 2021-04-26 LAB — BLADDER SCAN AMB NON-IMAGING: Scan Result: 23

## 2021-04-26 LAB — MICROSCOPIC EXAMINATION
Bacteria, UA: NONE SEEN
Epithelial Cells (non renal): NONE SEEN /hpf (ref 0–10)
RBC, Urine: NONE SEEN /hpf (ref 0–2)
WBC, UA: NONE SEEN /hpf (ref 0–5)

## 2021-04-26 LAB — URINALYSIS, COMPLETE
Bilirubin, UA: NEGATIVE
Glucose, UA: NEGATIVE
Ketones, UA: NEGATIVE
Leukocytes,UA: NEGATIVE
Nitrite, UA: NEGATIVE
Protein,UA: NEGATIVE
RBC, UA: NEGATIVE
Specific Gravity, UA: 1.02 (ref 1.005–1.030)
Urobilinogen, Ur: 0.2 mg/dL (ref 0.2–1.0)
pH, UA: 5.5 (ref 5.0–7.5)

## 2021-04-26 NOTE — Patient Instructions (Signed)
Cystoscopy Cystoscopy is a procedure that is used to help diagnose and sometimes treat conditions that affect the lower urinary tract. The lower urinary tract includes the bladder and the urethra. The urethra is the tube that drains urine from the bladder. Cystoscopy is done using a thin, tube-shaped instrument with a light and camera at the end (cystoscope). The cystoscope may be hard or flexible, depending on the goal of the procedure. The cystoscope is inserted through the urethra, into the bladder. Cystoscopy may be recommended if you have: Urinary tract infections that keep coming back. Blood in the urine (hematuria). An inability to control when you urinate (urinary incontinence) or an overactive bladder. Unusual cells found in a urine sample. A blockage in the urethra, such as a urinary stone. Painful urination. An abnormality in the bladder found during an intravenous pyelogram (IVP) or CT scan. Cystoscopy may also be done to remove a sample of tissue to be examined under a microscope (biopsy). What are the risks? Generally, this is a safe procedure. However, problems may occur, including: Infection. Bleeding.  What happens during the procedure?  You will be given one or more of the following: A medicine to numb the area (local anesthetic). The area around the opening of your urethra will be cleaned. The cystoscope will be passed through your urethra into your bladder. Germ-free (sterile) fluid will flow through the cystoscope to fill your bladder. The fluid will stretch your bladder so that your health care provider can clearly examine your bladder walls. Your doctor will look at the urethra and bladder. The cystoscope will be removed The procedure may vary among health care providers  What can I expect after the procedure? After the procedure, it is common to have: Some soreness or pain in your abdomen and urethra. Urinary symptoms. These include: Mild pain or burning when you  urinate. Pain should stop within a few minutes after you urinate. This may last for up to 1 week. A small amount of blood in your urine for several days. Feeling like you need to urinate but producing only a small amount of urine. Follow these instructions at home: General instructions Return to your normal activities as told by your health care provider.  Do not drive for 24 hours if you were given a sedative during your procedure. Watch for any blood in your urine. If the amount of blood in your urine increases, call your health care provider. If a tissue sample was removed for testing (biopsy) during your procedure, it is up to you to get your test results. Ask your health care provider, or the department that is doing the test, when your results will be ready. Drink enough fluid to keep your urine pale yellow. Keep all follow-up visits as told by your health care provider. This is important. Contact a health care provider if you: Have pain that gets worse or does not get better with medicine, especially pain when you urinate. Have trouble urinating. Have more blood in your urine. Get help right away if you: Have blood clots in your urine. Have abdominal pain. Have a fever or chills. Are unable to urinate. Summary Cystoscopy is a procedure that is used to help diagnose and sometimes treat conditions that affect the lower urinary tract. Cystoscopy is done using a thin, tube-shaped instrument with a light and camera at the end. After the procedure, it is common to have some soreness or pain in your abdomen and urethra. Watch for any blood in your urine.   If the amount of blood in your urine increases, call your health care provider. If you were prescribed an antibiotic medicine, take it as told by your health care provider. Do not stop taking the antibiotic even if you start to feel better. This information is not intended to replace advice given to you by your health care provider. Make  sure you discuss any questions you have with your health care provider. Document Revised: 08/06/2018 Document Reviewed: 08/06/2018 Elsevier Patient Education  2020 [employer] Inc[employer]  

## 2021-05-10 NOTE — Progress Notes (Incomplete)
05/11/2021  No chief complaint on file.    HPI: James Carrillo is a 70 y.o. male with a personal history of nocturia, BPH with LUTS, and history of urethral abnormality, who presents today for cystoscopy/TRUS.   He had surgery to get a blockage out of his urethra and was diagnosed with a outpouching of his urethra 1978.  He reported that this was performed through penile shaft near the glans.   There were no vitals filed for this visit. NED. A&Ox3.   No respiratory distress   Abd soft, NT, ND Normal phallus with bilateral descended testicles    Cystoscopy Procedure Note  Patient identification was confirmed, informed consent was obtained, and patient was prepped using Betadine solution.  Lidocaine jelly was administered per urethral meatus.    Preoperative abx where received prior to procedure.     Pre-Procedure: - Inspection reveals a normal caliber ureteral meatus.  Procedure: The flexible cystoscope was introduced without difficulty - No urethral strictures/lesions are present. - {Blank multiple:19197::"Enlarged","Surgically absent","Normal"} prostate *** - {Blank multiple:19197::"Normal","Elevated","Tight"} bladder neck - Bilateral ureteral orifices identified - Bladder mucosa  reveals no ulcers, tumors, or lesions - No bladder stones - No trabeculation  Retroflexion shows ***   Post-Procedure: - Patient tolerated the procedure well   Prostate transrectal ultrasound sizing   Informed consent was obtained after discussing risks/benefits of the procedure.  A time out was performed to ensure correct patient identity.   Pre-Procedure: -Transrectal probe was placed without difficulty -Transrectal Ultrasound performed revealing a *** gm prostate measuring *** x *** x *** cm (length) -No significant hypoechoic or median lobe noted      Assessment/ Plan:   I,Kailey Littlejohn,acting as a scribe for Hollice Espy, MD.,have documented all relevant documentation  on the behalf of Hollice Espy, MD,as directed by  Hollice Espy, MD while in the presence of Hollice Espy, MD.

## 2021-05-11 ENCOUNTER — Encounter: Payer: Medicare HMO | Admitting: Urology

## 2021-05-18 DIAGNOSIS — H2513 Age-related nuclear cataract, bilateral: Secondary | ICD-10-CM | POA: Diagnosis not present

## 2021-05-24 DIAGNOSIS — D225 Melanocytic nevi of trunk: Secondary | ICD-10-CM | POA: Diagnosis not present

## 2021-05-24 DIAGNOSIS — L820 Inflamed seborrheic keratosis: Secondary | ICD-10-CM | POA: Diagnosis not present

## 2021-05-24 DIAGNOSIS — D2261 Melanocytic nevi of right upper limb, including shoulder: Secondary | ICD-10-CM | POA: Diagnosis not present

## 2021-05-24 DIAGNOSIS — L814 Other melanin hyperpigmentation: Secondary | ICD-10-CM | POA: Diagnosis not present

## 2021-05-24 DIAGNOSIS — D2272 Melanocytic nevi of left lower limb, including hip: Secondary | ICD-10-CM | POA: Diagnosis not present

## 2021-05-24 DIAGNOSIS — D485 Neoplasm of uncertain behavior of skin: Secondary | ICD-10-CM | POA: Diagnosis not present

## 2021-05-24 DIAGNOSIS — L821 Other seborrheic keratosis: Secondary | ICD-10-CM | POA: Diagnosis not present

## 2021-05-24 DIAGNOSIS — L538 Other specified erythematous conditions: Secondary | ICD-10-CM | POA: Diagnosis not present

## 2021-05-24 DIAGNOSIS — D2271 Melanocytic nevi of right lower limb, including hip: Secondary | ICD-10-CM | POA: Diagnosis not present

## 2021-05-24 DIAGNOSIS — D2262 Melanocytic nevi of left upper limb, including shoulder: Secondary | ICD-10-CM | POA: Diagnosis not present

## 2021-05-30 NOTE — Progress Notes (Signed)
   06/01/21  CC:  Chief Complaint  Patient presents with   Cysto    TRUS     HPI: James Carrillo is a 70 y.o.male with a personal history of BPH with LUTS, nocturia, urethral abnormality , who presents today for cystoscopy/TRUS.   He had surgery to get a blockage out of his urethra and was diagnosed with a outpouching of his urethra 1978.  He prefers to avoid medications.   He is doing well today and reports no new urinary symptoms.    Vitals:   05/31/21 1446  BP: (!) 144/76  Pulse: (!) 44   NED. A&Ox3.   No respiratory distress   Abd soft, NT, ND Normal external genitalia with patent urethral meatus   Cystoscopy Procedure Note  Patient identification was confirmed, informed consent was obtained, and patient was prepped using Betadine solution.  Lidocaine jelly was administered per urethral meatus.    Preoperative abx where received prior to procedure.     Pre-Procedure: - Inspection reveals a normal caliber ureteral meatus.  Procedure: The flexible cystoscope was introduced without difficulty - No urethral strictures/lesions are present. - Mildly elevated  prostate with bilobar coaptation  - mildly Elevated bladder neck - Bilateral ureteral orifices identified - Bladder mucosa  reveals no ulcers, tumors, or lesions - No bladder stones - Mild trabeculation  Retroflexion shows significant medium lobe    Post-Procedure: - Patient tolerated the procedure well   Prostate transrectal ultrasound sizing   Informed consent was obtained after discussing risks/benefits of the procedure.  A time out was performed to ensure correct patient identity.   Pre-Procedure: -Transrectal probe was placed without difficulty -Transrectal Ultrasound performed revealing a 47.68 gm prostate measuring 4.81 x 3.62 x 5.22 cm (length) -No significant hypoechoic or median lobe noted      Assessment/ Plan:  BPH with LUTS  - Prostate probably a contributing factor of his  urinary symptoms. He is willing to try Flomax but he worried about side effects may be a good candidate for urolift, -He is not interested in other procedure such as TURP he was given literature today  - Prescribed a month of Flomax   Return in about 4 weeks (around 06/28/2021) for 16mo virtual visit. To reassess urinary symptoms/ discuss Uroflift pending experience with medication   Conley Rolls as a scribe for Hollice Espy, MD.,have documented all relevant documentation on the behalf of Hollice Espy, MD,as directed by  Hollice Espy, MD while in the presence of Hollice Espy, MD.  I have reviewed the above documentation for accuracy and completeness, and I agree with the above.   Hollice Espy, MD

## 2021-05-31 ENCOUNTER — Encounter: Payer: Self-pay | Admitting: Urology

## 2021-05-31 ENCOUNTER — Other Ambulatory Visit: Payer: Self-pay

## 2021-05-31 ENCOUNTER — Ambulatory Visit (INDEPENDENT_AMBULATORY_CARE_PROVIDER_SITE_OTHER): Payer: Medicare HMO | Admitting: Urology

## 2021-05-31 VITALS — BP 144/76 | HR 44 | Ht 68.0 in | Wt 180.0 lb

## 2021-05-31 DIAGNOSIS — R350 Frequency of micturition: Secondary | ICD-10-CM

## 2021-05-31 MED ORDER — TAMSULOSIN HCL 0.4 MG PO CAPS: 0.4000 mg | ORAL_CAPSULE | Freq: Every day | ORAL | 0 refills | Status: DC

## 2021-06-01 LAB — URINALYSIS, COMPLETE
Bilirubin, UA: NEGATIVE
Glucose, UA: NEGATIVE
Ketones, UA: NEGATIVE
Leukocytes,UA: NEGATIVE
Nitrite, UA: NEGATIVE
Protein,UA: NEGATIVE
RBC, UA: NEGATIVE
Specific Gravity, UA: 1.015 (ref 1.005–1.030)
Urobilinogen, Ur: 0.2 mg/dL (ref 0.2–1.0)
pH, UA: 6 (ref 5.0–7.5)

## 2021-06-01 LAB — MICROSCOPIC EXAMINATION
Bacteria, UA: NONE SEEN
Epithelial Cells (non renal): NONE SEEN /hpf (ref 0–10)
RBC, Urine: NONE SEEN /hpf (ref 0–2)

## 2021-06-02 ENCOUNTER — Ambulatory Visit: Payer: Medicare HMO

## 2021-06-28 ENCOUNTER — Telehealth: Payer: Medicare HMO | Admitting: Urology

## 2021-07-05 ENCOUNTER — Ambulatory Visit (INDEPENDENT_AMBULATORY_CARE_PROVIDER_SITE_OTHER): Payer: Medicare HMO

## 2021-07-05 VITALS — Ht 68.0 in | Wt 180.0 lb

## 2021-07-05 DIAGNOSIS — Z0000 Encounter for general adult medical examination without abnormal findings: Secondary | ICD-10-CM

## 2021-07-05 NOTE — Progress Notes (Signed)
Subjective:   James Carrillo is a 70 y.o. male who presents for Medicare Annual/Subsequent preventive examination.  Review of Systems    No ROS.  Medicare Wellness Virtual Visit.  Visual/audio telehealth visit, UTA vital signs.   See social history for additional risk factors.   Cardiac Risk Factors include: male gender;advanced age (>101men, >63 women)     Objective:    Today's Vitals   07/05/21 1537  Weight: 180 lb (81.6 kg)  Height: 5\' 8"  (1.727 m)   Body mass index is 27.37 kg/m.  Advanced Directives 07/05/2021 06/01/2020 11/13/2019 10/30/2019  Does Patient Have a Medical Advance Directive? Yes Yes Yes Yes  Type of Paramedic of [place];Living will [place];Living will [place];Living will -  Does patient want to make changes to medical advance directive? No - Patient declined No - Patient declined No - Patient declined -  Copy of [place] in Chart? Yes - validated most recent copy scanned in chart (See row information) Yes - validated most recent copy scanned in chart (See row information) Yes - validated most recent copy scanned in chart (See row information) -    Current Medications (verified) Outpatient Encounter Medications as of 07/05/2021  Medication Sig   Lysine 500 MG TABS Take 500 mg by mouth daily.    Multiple Vitamins-Minerals (MULTIVITAMIN ADULT PO) Take 1 tablet by mouth daily.    Red Yeast Rice 600 MG CAPS Take 600 mg by mouth in the morning and at bedtime.    [DISCONTINUED] tamsulosin (FLOMAX) 0.4 MG CAPS capsule Take 1 capsule (0.4 mg total) by mouth daily.   No facility-administered encounter medications on file as of 07/05/2021.    Allergies (verified) Other and Wheat bran   History: Past Medical History:  Diagnosis Date   Arthritis    Atrial fibrillation ([place])    per pt intermittent since 2003    Biceps tendinitis of right shoulder    [facility]  12/01/20   Claustrophobia    COVID-19    07/2020 x 5 days bad cold   Dysrhythmia    Multiple thyroid nodules    Palpitations    PVC's (premature ventricular contractions)    Rotator cuff tear    s/p surgery in 2000 now has tear per MRI orth[facility] Dr. Edd Fabian last steroid shot (right)   Past Surgical History:  Procedure Laterality Date   KNEE SURGERY Left    left shoulder surgery     2021 [facility] Dr. Raliegh Ip    MOLE REMOVAL  2004   Dr. Collene Schlichter   SHOULDER SURGERY Right 1999/2000   x1   TONSILLECTOMY     1956   Family History  Problem Relation Age of Onset   Melanoma Father        hand and arms   Arthritis Father    Cancer Father        throat dx age 26    Heart disease Father        MI age 11 and CABG hx    Arthritis Mother    Stroke Mother        age 29   Heart disease Mother        heart valve replaced older age    Renal Disease Paternal Grandmother        [place]    Social History   Socioeconomic History   Marital status: Single    Spouse name:  Not on file   Number of children: Not on file   Years of education: Not on file   Highest education level: Not on file  Occupational History   Not on file  Tobacco Use   Smoking status: Never   Smokeless tobacco: Never  Vaping Use   Vaping Use: Never used  Substance and Sexual Activity   Alcohol use: Yes    Comment: socially   Drug use: No   Sexual activity: Yes    Comment: women   Other Topics Concern   Not on file  Social History Narrative   Masters, professor    Single has adult kids sons    Social Determinants of Radio broadcast assistant Strain: Low Risk    Difficulty of Paying Living Expenses: Not hard at all  Food Insecurity: No Food Insecurity   Worried About Charity fundraiser in the Last Year: Never true   Arboriculturist in the Last Year: Never true  Transportation Needs: No Transportation Needs   Lack of Transportation (Medical): No   Lack of Transportation (Non-Medical): No   Physical Activity: Not on file  Stress: No Stress Concern Present   Feeling of Stress : Not at all  Social Connections: Unknown   Frequency of Communication with Friends and Family: More than three times a week   Frequency of Social Gatherings with Friends and Family: More than three times a week   Attends Religious Services: Not on Electrical engineer or Organizations: Not on file   Attends Archivist Meetings: Not on file   Marital Status: Not on file    Tobacco Counseling Counseling given: Not Answered   Clinical Intake:  Pre-visit preparation completed: Yes        Diabetes: No  How often do you need to have someone help you when you read instructions, pamphlets, or other written materials from your doctor or pharmacy?: 1 - Never   Interpreter Needed?: No      Activities of Daily Living In your present state of health, do you have any difficulty performing the following activities: 07/05/2021  Hearing? N  Vision? N  Difficulty concentrating or making decisions? N  Walking or climbing stairs? N  Dressing or bathing? N  Doing errands, shopping? N  Preparing Food and eating ? N  Using the Toilet? N  In the past six months, have you accidently leaked urine? N  Do you have problems with loss of bowel control? N  Managing your Medications? N  Managing your Finances? N  Housekeeping or managing your Housekeeping? N  Some recent data might be hidden    Patient Care Team: McLean-Scocuzza, Nino Glow, MD as PCP - General (Internal Medicine) Wellington Hampshire, MD as PCP - Cardiology (Cardiology)  Indicate any recent Medical Services you may have received from other than [facility] providers in the past year (date may be approximate).     Assessment:   This is a routine wellness examination for James Carrillo.  Virtual Visit via Telephone Note  I connected with  Enid Baas on 07/05/21 at  3:30 PM EST by telephone and verified that I am speaking with  the correct person using two identifiers.  Location: Patient: home Provider: office Persons participating in the virtual visit: patient/Nurse Health Advisor   I discussed the limitations, risks, security and privacy concerns of performing an evaluation and management service by telephone and the availability of in person appointments. The  patient expressed understanding and agreed to proceed.  Interactive audio and video telecommunications were attempted between this nurse and patient, however failed, due to patient having technical difficulties OR patient did not have access to video capability.  We continued and completed visit with audio only.  Some vital signs may be absent or patient reported.   OBrien-Blaney, Alyce Inscore L, LPN   Hearing/Vision screen Hearing Screening - Comments:: Patient is able to hear conversational tones without difficulty.  No issues reported. Vision Screening - Comments:: Wears corrective lenses They have seen their ophthalmologist in the last 12 months.    Dietary issues and exercise activities discussed: Current Exercise Habits: Home exercise routine, Intensity: Moderate   Goals Addressed             This Visit's Progress    Follow up with Primary Care Provider       As needed       Depression Screen PHQ 2/9 Scores 07/05/2021 06/01/2020 12/10/2019 01/28/2019 07/11/2018  PHQ - 2 Score 0 0 1 0 0    Fall Risk Fall Risk  07/05/2021 12/14/2020 06/15/2020 06/01/2020 12/10/2019  Falls in the past year? 0 0 0 0 0  Number falls in past yr: 0 0 0 0 0  Injury with Fall? - 0 0 - 0  Follow up Falls evaluation completed Falls evaluation completed Falls evaluation completed Falls evaluation completed Falls evaluation completed    [place]: Adequate lighting in your home to reduce risk of falls? Yes   ASSISTIVE DEVICES UTILIZED TO PREVENT FALLS: Life alert? No  Use of a cane, walker or w/c? No  Grab bars in the bathroom? No   Shower chair or bench in shower? No  Elevated toilet seat or a handicapped toilet? No   TIMED UP AND GO: Was the test performed? No .   Cognitive Function:        Immunizations Immunization History  Administered Date(s) Administered   Influenza,inj,quad, With Preservative 06/24/2018, 05/23/2019   Influenza-Unspecified 06/18/2017, 06/02/2019, 06/02/2020   Moderna SARS-COV2 Booster Vaccination 06/16/2020   Moderna Sars-Covid-2 Vaccination 10/07/2019, 11/04/2019, 12/06/2020   Pneumococcal Polysaccharide-23 10/25/2018   Pneumococcal-Unspecified 08/10/2017   Tdap 12/04/2017   Zoster Recombinat (Shingrix) 05/25/2014, 04/12/2020   Pneumococcal vaccine status: Due, Education has been provided regarding the importance of this vaccine. Advised may receive this vaccine at local pharmacy or Health Dept. Aware to provide a copy of the vaccination record if obtained from local pharmacy or Health Dept. Verbalized acceptance and understanding. Deferred.   Screening Tests Health Maintenance  Topic Date Due   COVID-19 Vaccine (5 - Booster) 07/21/2021 (Originally 01/31/2021)   Pneumonia Vaccine 68+ Years old (2 - PCV) 07/05/2022 (Originally 10/26/2019)   TETANUS/TDAP  12/05/2027   INFLUENZA VACCINE  Completed   Hepatitis C Screening  Completed   Zoster Vaccines- Shingrix  Completed   HPV VACCINES  Aged Out   COLONOSCOPY (Pts 45-67yrs Insurance coverage will need to be confirmed)  Discontinued    Health Maintenance There are no preventive care reminders to display for this patient.  Lung Cancer Screening: (Low Dose CT Chest recommended if Age 74-80 years, 30 pack-year currently smoking OR have quit w/in 15years.) does not qualify.   Vision Screening: Recommended annual ophthalmology exams for early detection of glaucoma and other disorders of the eye.  Dental Screening: Recommended annual dental exams for proper oral hygiene  Community Resource Referral / Chronic Care Management: CRR  required this visit?  No  CCM required this visit?  No      Plan:   Keep all routine maintenance appointments.   I have personally reviewed and noted the following in the patient's chart:   Medical and social history Use of alcohol, tobacco or illicit drugs  Current medications and supplements including opioid prescriptions. Patient is not currently taking opioid prescriptions. Functional ability and status Nutritional status Physical activity Advanced directives List of other physicians Hospitalizations, surgeries, and ER visits in previous 12 months Vitals Screenings to include cognitive, depression, and falls Referrals and appointments  In addition, I have reviewed and discussed with patient certain preventive protocols, quality metrics, and best practice recommendations. A written personalized care plan for preventive services as well as general preventive health recommendations were provided to patient.     Varney Biles, LPN   76/09/2631

## 2021-07-05 NOTE — Patient Instructions (Addendum)
James Carrillo , Thank you for taking time to come for your Medicare Wellness Visit. I appreciate your ongoing commitment to your health goals. Please review the following plan we discussed and let me know if I can assist you in the future.   These are the goals we discussed:  Goals      Follow up with Primary Care Provider     As needed        This is a list of the screening recommended for you and due dates:  Health Maintenance  Topic Date Due   COVID-19 Vaccine (5 - Booster) 07/21/2021*   Pneumonia Vaccine (2 - PCV) 07/05/2022*   Tetanus Vaccine  12/05/2027   Flu Shot  Completed   Hepatitis C Screening: USPSTF Recommendation to screen - Ages 18-79 yo.  Completed   Zoster (Shingles) Vaccine  Completed   HPV Vaccine  Aged Out   Colon Cancer Screening  Discontinued  *Topic was postponed. The date shown is not the original due date.   Advanced directives: on file  Conditions/risks identified: none new  Follow up in one year for your annual wellness visit.   Preventive Care 9 Years and Older, Male Preventive care refers to lifestyle choices and visits with your health care provider that can promote health and wellness. What does preventive care include? A yearly physical exam. This is also called an annual well check. Dental exams once or twice a year. Routine eye exams. Ask your health care provider how often you should have your eyes checked. Personal lifestyle choices, including: Daily care of your teeth and gums. Regular physical activity. Eating a healthy diet. Avoiding tobacco and drug use. Limiting alcohol use. Practicing safe sex. Taking low doses of aspirin every day. Taking vitamin and mineral supplements as recommended by your health care provider. What happens during an annual well check? The services and screenings done by your health care provider during your annual well check will depend on your age, overall health, lifestyle risk factors, and family  history of disease. Counseling  Your health care provider may ask you questions about your: Alcohol use. Tobacco use. Drug use. Emotional well-being. Home and relationship well-being. Sexual activity. Eating habits. History of falls. Memory and ability to understand (cognition). Work and work Statistician. Screening  You may have the following tests or measurements: Height, weight, and BMI. Blood pressure. Lipid and cholesterol levels. These may be checked every 5 years, or more frequently if you are over 62 years old. Skin check. Lung cancer screening. You may have this screening every year starting at age 30 if you have a 30-pack-year history of smoking and currently smoke or have quit within the past 15 years. Fecal occult blood test (FOBT) of the stool. You may have this test every year starting at age 55. Flexible sigmoidoscopy or colonoscopy. You may have a sigmoidoscopy every 5 years or a colonoscopy every 10 years starting at age 52. Prostate cancer screening. Recommendations will vary depending on your family history and other risks. Hepatitis C blood test. Hepatitis B blood test. Sexually transmitted disease (STD) testing. Diabetes screening. This is done by checking your blood sugar (glucose) after you have not eaten for a while (fasting). You may have this done every 1-3 years. Abdominal aortic aneurysm (AAA) screening. You may need this if you are a current or former smoker. Osteoporosis. You may be screened starting at age 62 if you are at high risk. Talk with your health care provider about your test  results, treatment options, and if necessary, the need for more tests. Vaccines  Your health care provider may recommend certain vaccines, such as: Influenza vaccine. This is recommended every year. Tetanus, diphtheria, and acellular pertussis (Tdap, Td) vaccine. You may need a Td booster every 10 years. Zoster vaccine. You may need this after age 51. Pneumococcal  13-valent conjugate (PCV13) vaccine. One dose is recommended after age 65. Pneumococcal polysaccharide (PPSV23) vaccine. One dose is recommended after age 24. Talk to your health care provider about which screenings and vaccines you need and how often you need them. This information is not intended to replace advice given to you by your health care provider. Make sure you discuss any questions you have with your health care provider. Document Released: 09/10/2015 Document Revised: 05/03/2016 Document Reviewed: 06/15/2015 Elsevier Interactive Patient Education  2017 [place] Prevention in the Home Falls can cause injuries. They can happen to people of all ages. There are many things you can do to make your home safe and to help prevent falls. What can I do on the outside of my home? Regularly fix the edges of walkways and driveways and fix any cracks. Remove anything that might make you trip as you walk through a door, such as a raised step or threshold. Trim any bushes or trees on the path to your home. Use bright outdoor lighting. Clear any walking paths of anything that might make someone trip, such as rocks or tools. Regularly check to see if handrails are loose or broken. Make sure that both sides of any steps have handrails. Any raised decks and porches should have guardrails on the edges. Have any leaves, snow, or ice cleared regularly. Use sand or salt on walking paths during winter. Clean up any spills in your garage right away. This includes oil or grease spills. What can I do in the bathroom? Use night lights. Install grab bars by the toilet and in the tub and shower. Do not use towel bars as grab bars. Use non-skid mats or decals in the tub or shower. If you need to sit down in the shower, use a plastic, non-slip stool. Keep the floor dry. Clean up any water that spills on the floor as soon as it happens. Remove soap buildup in the tub or shower regularly. Attach  bath mats securely with double-sided non-slip rug tape. Do not have throw rugs and other things on the floor that can make you trip. What can I do in the bedroom? Use night lights. Make sure that you have a light by your bed that is easy to reach. Do not use any sheets or blankets that are too big for your bed. They should not hang down onto the floor. Have a firm chair that has side arms. You can use this for support while you get dressed. Do not have throw rugs and other things on the floor that can make you trip. What can I do in the kitchen? Clean up any spills right away. Avoid walking on wet floors. Keep items that you use a lot in easy-to-reach places. If you need to reach something above you, use a strong step stool that has a grab bar. Keep electrical cords out of the way. Do not use floor polish or wax that makes floors slippery. If you must use wax, use non-skid floor wax. Do not have throw rugs and other things on the floor that can make you trip. What can I do with my stairs?  Do not leave any items on the stairs. Make sure that there are handrails on both sides of the stairs and use them. Fix handrails that are broken or loose. Make sure that handrails are as long as the stairways. Check any carpeting to make sure that it is firmly attached to the stairs. Fix any carpet that is loose or worn. Avoid having throw rugs at the top or bottom of the stairs. If you do have throw rugs, attach them to the floor with carpet tape. Make sure that you have a light switch at the top of the stairs and the bottom of the stairs. If you do not have them, ask someone to add them for you. What else can I do to help prevent falls? Wear shoes that: Do not have high heels. Have rubber bottoms. Are comfortable and fit you well. Are closed at the toe. Do not wear sandals. If you use a stepladder: Make sure that it is fully opened. Do not climb a closed stepladder. Make sure that both sides of the  stepladder are locked into place. Ask someone to hold it for you, if possible. Clearly mark and make sure that you can see: Any grab bars or handrails. First and last steps. Where the edge of each step is. Use tools that help you move around (mobility aids) if they are needed. These include: Canes. Walkers. Scooters. Crutches. Turn on the lights when you go into a dark area. Replace any light bulbs as soon as they burn out. Set up your furniture so you have a clear path. Avoid moving your furniture around. If any of your floors are uneven, fix them. If there are any pets around you, be aware of where they are. Review your medicines with your doctor. Some medicines can make you feel dizzy. This can increase your chance of falling. Ask your doctor what other things that you can do to help prevent falls. This information is not intended to replace advice given to you by your health care provider. Make sure you discuss any questions you have with your health care provider. Document Released: 06/10/2009 Document Revised: 01/20/2016 Document Reviewed: 09/18/2014 Elsevier Interactive Patient Education  2017 [employer].

## 2021-07-14 DIAGNOSIS — Z0120 Encounter for dental examination and cleaning without abnormal findings: Secondary | ICD-10-CM | POA: Diagnosis not present

## 2021-07-20 ENCOUNTER — Other Ambulatory Visit: Payer: Self-pay

## 2021-07-20 DIAGNOSIS — Z1211 Encounter for screening for malignant neoplasm of colon: Secondary | ICD-10-CM

## 2021-07-20 DIAGNOSIS — Z1212 Encounter for screening for malignant neoplasm of rectum: Secondary | ICD-10-CM

## 2021-08-11 ENCOUNTER — Encounter: Payer: Self-pay | Admitting: Internal Medicine

## 2021-08-12 NOTE — Telephone Encounter (Signed)
Please advise, last correspondence scanned into Patient's chart 07/11/21.

## 2021-08-16 ENCOUNTER — Encounter: Payer: Self-pay | Admitting: Internal Medicine

## 2021-08-17 NOTE — Addendum Note (Signed)
Addended by: Orland Mustard on: 08/17/2021 05:00 PM   Modules accepted: Orders

## 2021-08-17 NOTE — Telephone Encounter (Signed)
Okay for referral or does Patient need an appointment?

## 2021-08-18 ENCOUNTER — Encounter: Payer: Self-pay | Admitting: Internal Medicine

## 2021-08-18 NOTE — Telephone Encounter (Signed)
For your information  

## 2021-08-19 DIAGNOSIS — M1711 Unilateral primary osteoarthritis, right knee: Secondary | ICD-10-CM | POA: Diagnosis not present

## 2021-08-19 DIAGNOSIS — M170 Bilateral primary osteoarthritis of knee: Secondary | ICD-10-CM | POA: Diagnosis not present

## 2021-11-02 DIAGNOSIS — M5412 Radiculopathy, cervical region: Secondary | ICD-10-CM | POA: Diagnosis not present

## 2021-11-17 DIAGNOSIS — G5601 Carpal tunnel syndrome, right upper limb: Secondary | ICD-10-CM | POA: Diagnosis not present

## 2021-11-24 DIAGNOSIS — G5601 Carpal tunnel syndrome, right upper limb: Secondary | ICD-10-CM | POA: Diagnosis not present

## 2021-11-30 ENCOUNTER — Telehealth: Payer: Self-pay | Admitting: Internal Medicine

## 2021-11-30 NOTE — Telephone Encounter (Signed)
Rejection Reason - Patient did not respond" ?Everardo Beals said on Nov 29, 2021 3:56 PM ? ?"Left message to call back to schedule appointment" ?Everardo Beals said on Nov 17, 2021 10:43 AM ? ?"called patient no answer left message " ?Selinda Orion said on Aug 18, 2021 4:00 PM ? ?Msg from [facility] neurology  ?

## 2022-01-20 ENCOUNTER — Ambulatory Visit (INDEPENDENT_AMBULATORY_CARE_PROVIDER_SITE_OTHER): Payer: Medicare HMO | Admitting: Internal Medicine

## 2022-01-20 ENCOUNTER — Encounter: Payer: Self-pay | Admitting: Internal Medicine

## 2022-01-20 VITALS — BP 118/70 | HR 48 | Temp 97.6°F | Resp 14 | Ht 68.0 in | Wt 183.8 lb

## 2022-01-20 DIAGNOSIS — N400 Enlarged prostate without lower urinary tract symptoms: Secondary | ICD-10-CM

## 2022-01-20 DIAGNOSIS — E538 Deficiency of other specified B group vitamins: Secondary | ICD-10-CM

## 2022-01-20 DIAGNOSIS — Z1211 Encounter for screening for malignant neoplasm of colon: Secondary | ICD-10-CM

## 2022-01-20 DIAGNOSIS — R001 Bradycardia, unspecified: Secondary | ICD-10-CM

## 2022-01-20 DIAGNOSIS — E291 Testicular hypofunction: Secondary | ICD-10-CM | POA: Diagnosis not present

## 2022-01-20 DIAGNOSIS — E041 Nontoxic single thyroid nodule: Secondary | ICD-10-CM | POA: Diagnosis not present

## 2022-01-20 DIAGNOSIS — E785 Hyperlipidemia, unspecified: Secondary | ICD-10-CM | POA: Diagnosis not present

## 2022-01-20 DIAGNOSIS — R7303 Prediabetes: Secondary | ICD-10-CM | POA: Diagnosis not present

## 2022-01-20 DIAGNOSIS — Z0000 Encounter for general adult medical examination without abnormal findings: Secondary | ICD-10-CM

## 2022-01-20 NOTE — Progress Notes (Signed)
Chief Complaint  Patient presents with   Follow-up    Denies any concerns or unusual pain. Requesting order for cologuard   Annual Exam   Annual  1. Chronic bradycardia and h/o Dvt w/o sxs declines meds  2. Chronic MSK pain  3. C/o inability to lose wt and goal 165 though swimming to exercise 30 min 3x per week and walking    Review of Systems  Constitutional:  Negative for weight loss.  HENT:  Negative for hearing loss.   Eyes:  Negative for blurred vision.  Respiratory:  Negative for shortness of breath.   Gastrointestinal:  Negative for abdominal pain and blood in stool.  Musculoskeletal:  Negative for back pain.  Skin:  Negative for rash.  Neurological:  Negative for loss of consciousness and headaches.  Psychiatric/Behavioral:  Negative for depression.   Past Medical History:  Diagnosis Date   Arthritis    Atrial fibrillation ([place])    per pt intermittent since 2003    Biceps tendinitis of right shoulder    [facility] [place] [facility]01/20   Claustrophobia    COVID-19    07/2020 x 5 days bad cold   Dysrhythmia    Multiple thyroid nodules    Palpitations    PVC's (premature ventricular contractions)    Rotator cuff tear    s/p surgery in 2000 now has tear per [facility] Dr. Edd Fabian last steroid shot (right)   Past Surgical History:  Procedure Laterality Date   KNEE SURGERY Left    left shoulder surgery     2021 [facility] Dr. Raliegh Ip    MOLE REMOVAL  2004   Dr. Collene Schlichter   SHOULDER SURGERY Right 1999/2000   x1   TONSILLECTOMY     1956   Family History  Problem Relation Age of Onset   Melanoma Father        hand and arms   Arthritis Father    Cancer Father        throat dx age 17    Heart disease Father        MI age 55 and CABG hx    Arthritis Mother    Stroke Mother        age 24   Heart disease Mother        heart valve replaced older age    Renal Disease Paternal Grandmother        [place]    Social History   Socioeconomic History    Marital status: Single    Spouse name: Not on file   Number of children: Not on file   Years of education: Not on file   Highest education level: Not on file  Occupational History   Not on file  Tobacco Use   Smoking status: Never   Smokeless tobacco: Never  Vaping Use   Vaping Use: Never used  Substance and Sexual Activity   Alcohol use: Yes    Comment: socially   Drug use: No   Sexual activity: Yes    Comment: women   Other Topics Concern   Not on file  Social History Narrative   Masters, professor    Single has adult kids sons    Social Determinants of Radio broadcast assistant Strain: Low Risk    Difficulty of Paying Living Expenses: Not hard at all  Food Insecurity: No Food Insecurity   Worried About Charity fundraiser in the Last Year: Never true   [employer]  of Food in the Last Year: Never true  Transportation Needs: No Transportation Needs   Lack of Transportation (Medical): No   Lack of Transportation (Non-Medical): No  Physical Activity: Not on file  Stress: No Stress Concern Present   Feeling of Stress : Not at all  Social Connections: Unknown   Frequency of Communication with Friends and Family: More than three times a week   Frequency of Social Gatherings with Friends and Family: More than three times a week   Attends Religious Services: Not on Electrical engineer or Organizations: Not on file   Attends Archivist Meetings: Not on file   Marital Status: Not on file  Intimate Partner Violence: Not At Risk   Fear of Current or Ex-Partner: No   Emotionally Abused: No   Physically Abused: No   Sexually Abused: No   Current Meds  Medication Sig   Lysine 500 MG TABS Take 500 mg by mouth daily.    meloxicam (MOBIC) 7.5 MG tablet Take 7.5 mg by mouth daily.   Multiple Vitamins-Minerals (MULTIVITAMIN ADULT PO) Take 1 tablet by mouth daily.    Red Yeast Rice 600 MG CAPS Take 600 mg by mouth in the morning and at bedtime.    Allergies   Allergen Reactions   Other Other (See Comments)    Avoid fluoroquinolones d/t aortic root dilatation    Wheat Bran     WHOLE WHEAT   No results found for this or any previous visit (from the past 2160 hour(s)). Objective  Body mass index is 27.95 kg/m. Wt Readings from Last 3 Encounters:  01/20/22 183 lb 12.8 oz (83.4 kg)  07/05/21 180 lb (81.6 kg)  05/31/21 180 lb (81.6 kg)   Temp Readings from Last 3 Encounters:  01/20/22 97.6 F (36.4 C) (Oral)  12/14/20 97.6 F (36.4 C) (Temporal)  06/15/20 98 F (36.7 C) (Oral)   BP Readings from Last 3 Encounters:  01/20/22 118/70  05/31/21 (!) 144/76  04/26/21 138/74   Pulse Readings from Last 3 Encounters:  01/20/22 (!) 48  05/31/21 (!) 44  04/26/21 68    Physical Exam Vitals and nursing note reviewed.  Constitutional:      Appearance: Normal appearance. He is well-developed and well-groomed.  HENT:     Head: Normocephalic and atraumatic.  Eyes:     Conjunctiva/sclera: Conjunctivae normal.     Pupils: Pupils are equal, round, and reactive to light.  Cardiovascular:     Rate and Rhythm: Normal rate and regular rhythm.     Heart sounds: Normal heart sounds.  Pulmonary:     Effort: Pulmonary effort is normal. No respiratory distress.     Breath sounds: Normal breath sounds.  Abdominal:     Tenderness: There is no abdominal tenderness.  Skin:    General: Skin is warm and moist.  Neurological:     General: No focal deficit present.     Mental Status: He is alert and oriented to person, place, and time. Mental status is at baseline.     Sensory: Sensation is intact.     Motor: Motor function is intact.     Coordination: Coordination is intact.     Gait: Gait is intact. Gait normal.  Psychiatric:        Attention and Perception: Attention and perception normal.        Mood and Affect: Mood and affect normal.        Speech: Speech normal.  Behavior: Behavior normal. Behavior is cooperative.        Thought  Content: Thought content normal.        Cognition and Memory: Cognition and memory normal.        Judgment: Judgment normal.    Assessment  Plan  Annual physical exam See below   Screen for colon cancer - Plan: Cologuard  Benign prostatic hyperplasia, unspecified whether lower urinary tract symptoms present - Plan: CBC with Differential/Platelet, PSA Declines meds  Established Dr. Erlene Quan  Thyroid nodule - Plan: CBC with Differential/Platelet, TSH See below  Bradycardia h/o SVT  Hyperlipidemia, unspecified hyperlipidemia type - Plan: Comprehensive metabolic panel, Lipid panel, CBC with Differential/Platelet  Prediabetes - Plan: Hemoglobin A1c Healthy diet and exercise   HM utd flu 10/621 -prevnar had 06/04/15, pna 23 utd ,shingles had 05/25/14 (zostervax)  -confirm with pharmacy and if not had shingrix had 1/2 logged check 2nd dose  Tdap utd 12/04/17   hep B vaccine given Rx prev to get this   covid 4/4 moderna   cologuard neg 08/05/18 normal ordered  Never smoker Hep C neg 04/2016  PSA 0.89 06/28/20 check in 1 year   rec hep B vaccine 03/14/17 titer low <10. sAg and core total neg  [facility] seen 07/31/2019 bx pending sch fasting labs fall 2021 Rec healthy diet and exercise     IMPRESSION: 1. Similar findings of borderline thyromegaly without new or enlarging thyroid nodule. 2. Nodule #1 is unchanged compared to the 2019 examination though again barely meets imaging criteria to recommend annual/biannual surveillance. This examination documents 29 months of stability. Follow-up examination in 09/2022 would ensure 5 years of stability and thus a benign etiology. 3. Nodule #2 has undergone cystic degeneration and as such no longer meets imaging criteria to recommend continued dedicated follow-up.   The above is in keeping with the ACR TI-RADS recommendations - J Am Coll Radiol 2017;14:587-595.     Electronically Signed   By: Sandi Mariscal M.D.   On:  03/01/2021 13:45  EP Dr. Caryl Comes   Provider: Dr. Olivia Mackie McLean-[facility]

## 2022-01-20 NOTE — Patient Instructions (Addendum)
Consider covid 19 vaccine  (new one)   Supraventricular Tachycardia, Adult Supraventricular tachycardia (SVT) is a type of abnormal heart rhythm. It causes the heart to beat very quickly. SVT can start suddenly and last for a short time, which is called paroxysmal SVT, or it may last longer and require specialized treatment to return the heart rhythm to normal. A normal resting heart rate is 60-100 beats per minute. During an episode of SVT, your heart rate may be higher than 150 beats per minute. Episodes of SVT can be frightening, but they are usually not dangerous. However, if episodes happen several times a day or last longer than a few seconds, they may lead to heart failure. What are the causes?  Usually, a normal heartbeat starts when an area called the sinoatrial node releases an electrical signal. In SVT, other areas of the heart send out electrical signals that interfere with the signal from the sinoatrial node. The cause of this abnormal electrical activity is not known. What increases the risk? You are more likely to develop this condition if you are: Middle aged or younger. Male. The following factors may also make you more likely to develop this condition: Stress or anxiety. Tiredness. Smoking. Stimulant drugs, such as cocaine and methamphetamine. Alcohol. Caffeine. Pregnancy. Having any of these conditions: A thyroid condition. Diabetes mellitus. Obstructive sleep apnea. What are the signs or symptoms? Symptoms of this condition include: A pounding heart. A feeling that the heart is skipping beats (palpitations). Weakness. Shortness of breath. Tightness or pain in your chest. Light-headedness or dizziness. Anxiety. Sweating. Nausea. Fainting. Fatigue or tiredness. A mild episode may not cause symptoms. How is this diagnosed? This condition may be diagnosed based on: Your symptoms. A physical exam. If you have an episode of SVT during the exam, the health care  provider may be able to diagnose SVT by listening to your heart and feeling your pulse. Tests. These may include: An electrocardiogram (ECG). This test is done to check for problems with electrical activity in the heart. A Holter monitor or event monitor test. This test involves wearing a portable device that monitors your heart rate over time. An echocardiogram. This test involves taking an image of your heart using sound waves. It is done to rule out other causes of a fast heart rate. A stress echocardiogram. This test involves doing an echocardiogram when you are at rest and after exercise. Blood tests. An electrophysiology study (EPS). This tests the electrical activity in your heart to find where the abnormal heart rhythm is coming from using cardiac catheters. How is this treated? This condition may be treated with: Vagal nerve stimulation. This involves stimulating your vagus nerve, which is a nerve that runs from the chest, through the neck, to the lower part of the brain. Stimulating this nerve can slow down the heart. It is often the first and only treatment that is needed for this condition. Work with your health care provider to find which technique works best for you. Ways to do this treatment include: Laying on your back, then holding your breath and pushing, as though you are having a bowel movement. Massaging an area on one side of your neck, below your jaw. Do not try this yourself. Only a health care provider should do this. If done the wrong way, it can lead to a stroke. Bending forward with your head between your legs. Coughing while bending forward with your head between your legs. Applying an ice-cold, wet towel to your  face. Medicines that prevent attacks. Medicine to stop an attack. The medicine is given through an IV at the hospital. A small electric shock (cardioversion) that stops an attack. Before you get the shock, you will get medicine to make you fall  asleep. Radiofrequency ablation. In this procedure, a small, thin tube (catheter) is used to send radiofrequency energy to the area of tissue that is causing the rapid heartbeats. The energy kills the cells and helps your heart keep a normal rhythm. You may have this treatment if you have symptoms of SVT often. If you do not have symptoms, you may not need treatment. Follow these instructions at home: Stress Avoid stressful situations when possible. Find healthy ways of managing stress, such as: Taking part in relaxing activities, such as yoga, meditation, or being out in nature. Listening to relaxing music. Practicing relaxation techniques, such as deep breathing. Leading a healthy lifestyle. This involves getting plenty of sleep, exercising, and eating a balanced diet. Attending counseling or talk therapy with a mental health professional. Lifestyle  Try to get at least 7 hours of sleep each night. Do not use any products that contain nicotine or tobacco. These products include cigarettes, chewing tobacco, and vaping devices, such as e-cigarettes. If you need help quitting, ask your health care provider. Do not drink alcohol if it triggers episodes of SVT. If alcohol does not seem to trigger episodes, limit your alcohol intake. If you drink alcohol: Limit how much you have to: 0-1 drink a day for women who are not pregnant. 0-2 drinks a day for men. Know how much alcohol is in your drink. In the [place] one drink equals one 12 oz bottle of beer (355 mL), one 5 oz glass of wine (148 mL), or one 1 oz glass of hard liquor (44 mL). Be aware of how caffeine affects your condition. If caffeine: Triggers episodes of SVT, do not eat, drink, or use anything with caffeine in it. Does not seem to trigger episodes, consume caffeine in moderation. Do not use stimulant drugs. If you need help quitting, talk with your health care provider. General instructions Maintain a healthy weight. Exercise  regularly. Ask your health care provider to suggest some good activities for you. Aim for one or a combination of the following: 150 minutes per week of moderate exercise, such as walking or yoga. 75 minutes per week of vigorous exercise, such as running or swimming. Perform vagus nerve stimulation as directed by your health care provider. Take over-the-counter and prescription medicines only as told by your health care provider. Keep all follow-up visits. This is important. Contact a health care provider if: You have episodes of SVT more often than before. Episodes of SVT last longer than before. Vagus nerve stimulation is no longer helping. You have new symptoms. Get help right away if: You have chest pain. Your symptoms get worse. You have trouble breathing. You have an episode of SVT that lasts longer than 20 minutes. You faint. These symptoms may represent a serious problem that is an emergency. Do not wait to see if the symptoms will go away. Get medical help right away. Call your local emergency services (911 in the [place] Do not drive yourself to the hospital. Summary Supraventricular tachycardia (SVT) is a type of abnormal heart rhythm. During an episode of SVT, your heart rate may be higher than 150 beats per minute. If you do not have symptoms, you may not need treatment. This information is not intended to replace advice given  to you by your health care provider. Make sure you discuss any questions you have with your health care provider. Document Revised: 03/27/2020 Document Reviewed: 03/27/2020 Elsevier Patient Education  [place].

## 2022-01-27 ENCOUNTER — Encounter: Payer: Self-pay | Admitting: Internal Medicine

## 2022-01-27 ENCOUNTER — Other Ambulatory Visit (INDEPENDENT_AMBULATORY_CARE_PROVIDER_SITE_OTHER): Payer: Medicare HMO

## 2022-01-27 DIAGNOSIS — R7303 Prediabetes: Secondary | ICD-10-CM | POA: Diagnosis not present

## 2022-01-27 DIAGNOSIS — E538 Deficiency of other specified B group vitamins: Secondary | ICD-10-CM | POA: Diagnosis not present

## 2022-01-27 DIAGNOSIS — E041 Nontoxic single thyroid nodule: Secondary | ICD-10-CM

## 2022-01-27 DIAGNOSIS — E785 Hyperlipidemia, unspecified: Secondary | ICD-10-CM | POA: Diagnosis not present

## 2022-01-27 DIAGNOSIS — E291 Testicular hypofunction: Secondary | ICD-10-CM

## 2022-01-27 DIAGNOSIS — N400 Enlarged prostate without lower urinary tract symptoms: Secondary | ICD-10-CM | POA: Diagnosis not present

## 2022-01-27 LAB — LIPID PANEL
Cholesterol: 169 mg/dL (ref 0–200)
HDL: 39.7 mg/dL (ref >39.00)
LDL Cholesterol: 93 mg/dL (ref 0–99)
NonHDL: 129.18
Total CHOL/HDL Ratio: 4
Triglycerides: 179 mg/dL — ABNORMAL HIGH (ref 0.0–149.0)
VLDL: 35.8 mg/dL (ref 0.0–40.0)

## 2022-01-27 LAB — COMPREHENSIVE METABOLIC PANEL
ALT: 22 U/L (ref 0–53)
AST: 22 U/L (ref 0–37)
Albumin: 4.4 g/dL (ref 3.5–5.2)
Alkaline Phosphatase: 55 U/L (ref 39–117)
BUN: 19 mg/dL (ref 6–23)
CO2: 28 mEq/L (ref 19–32)
Calcium: 9.2 mg/dL (ref 8.4–10.5)
Chloride: 104 mEq/L (ref 96–112)
Creatinine, Ser: 0.93 mg/dL (ref 0.40–1.50)
GFR: 81.7 mL/min (ref >60.00)
Glucose, Bld: 88 mg/dL (ref 70–99)
Potassium: 4.1 mEq/L (ref 3.5–5.1)
Sodium: 139 mEq/L (ref 135–145)
Total Bilirubin: 0.7 mg/dL (ref 0.2–1.2)
Total Protein: 6.6 g/dL (ref 6.0–8.3)

## 2022-01-27 LAB — CBC WITH DIFFERENTIAL/PLATELET
Basophils Absolute: 0 10*3/uL (ref 0.0–0.1)
Basophils Relative: 0.7 % (ref 0.0–3.0)
Eosinophils Absolute: 0.2 10*3/uL (ref 0.0–0.7)
Eosinophils Relative: 2.8 % (ref 0.0–5.0)
HCT: 46.9 % (ref 39.0–52.0)
Hemoglobin: 15.8 g/dL (ref 13.0–17.0)
Lymphocytes Relative: 34.3 % (ref 12.0–46.0)
Lymphs Abs: 2.3 10*3/uL (ref 0.7–4.0)
MCHC: 33.7 g/dL (ref 30.0–36.0)
MCV: 93.7 fl (ref 78.0–100.0)
Monocytes Absolute: 0.9 10*3/uL (ref 0.1–1.0)
Monocytes Relative: 13.6 % — ABNORMAL HIGH (ref 3.0–12.0)
Neutro Abs: 3.3 10*3/uL (ref 1.4–7.7)
Neutrophils Relative %: 48.6 % (ref 43.0–77.0)
Platelets: 169 10*3/uL (ref 150.0–400.0)
RBC: 5.01 Mil/uL (ref 4.22–5.81)
RDW: 12.6 % (ref 11.5–15.5)
WBC: 6.8 10*3/uL (ref 4.0–10.5)

## 2022-01-27 LAB — TSH: TSH: 1.58 u[IU]/mL (ref 0.35–5.50)

## 2022-01-27 LAB — VITAMIN B12: Vitamin B-12: 398 pg/mL (ref 211–911)

## 2022-01-27 LAB — HEMOGLOBIN A1C: Hgb A1c MFr Bld: 5.7 % (ref 4.6–6.5)

## 2022-01-27 LAB — PSA: PSA: 0.99 ng/mL (ref 0.10–4.00)

## 2022-01-27 LAB — TESTOSTERONE: Testosterone: 319.49 ng/dL (ref 300.00–890.00)

## 2022-01-30 ENCOUNTER — Encounter: Payer: Self-pay | Admitting: Internal Medicine

## 2022-01-30 DIAGNOSIS — G5600 Carpal tunnel syndrome, unspecified upper limb: Secondary | ICD-10-CM | POA: Insufficient documentation

## 2022-04-13 ENCOUNTER — Encounter: Payer: Self-pay | Admitting: Internal Medicine

## 2022-04-13 ENCOUNTER — Other Ambulatory Visit: Payer: Self-pay | Admitting: Family

## 2022-04-18 ENCOUNTER — Encounter: Payer: Self-pay | Admitting: Internal Medicine

## 2022-04-20 ENCOUNTER — Encounter: Payer: Self-pay | Admitting: Internal Medicine

## 2022-04-20 ENCOUNTER — Ambulatory Visit (INDEPENDENT_AMBULATORY_CARE_PROVIDER_SITE_OTHER): Payer: Medicare HMO | Admitting: Internal Medicine

## 2022-04-20 ENCOUNTER — Telehealth: Payer: Self-pay | Admitting: Internal Medicine

## 2022-04-20 VITALS — BP 138/80 | HR 85 | Temp 98.0°F | Ht 68.0 in | Wt 181.4 lb

## 2022-04-20 DIAGNOSIS — M542 Cervicalgia: Secondary | ICD-10-CM

## 2022-04-20 DIAGNOSIS — R591 Generalized enlarged lymph nodes: Secondary | ICD-10-CM | POA: Diagnosis not present

## 2022-04-20 DIAGNOSIS — R1084 Generalized abdominal pain: Secondary | ICD-10-CM

## 2022-04-20 DIAGNOSIS — R590 Localized enlarged lymph nodes: Secondary | ICD-10-CM | POA: Diagnosis not present

## 2022-04-20 DIAGNOSIS — Z1211 Encounter for screening for malignant neoplasm of colon: Secondary | ICD-10-CM | POA: Diagnosis not present

## 2022-04-20 DIAGNOSIS — K115 Sialolithiasis: Secondary | ICD-10-CM

## 2022-04-20 DIAGNOSIS — R200 Anesthesia of skin: Secondary | ICD-10-CM | POA: Diagnosis not present

## 2022-04-20 DIAGNOSIS — J341 Cyst and mucocele of nose and nasal sinus: Secondary | ICD-10-CM

## 2022-04-20 NOTE — Telephone Encounter (Signed)
Lft pt vm to call ofc . thanks 

## 2022-04-20 NOTE — Patient Instructions (Addendum)
Dr. Fletcher Anon appt due 07/09/22  Flu shot and covid shot at the pharmacy  Phone Fax E-mail Address  [phone] [email or web address] [address]   [address]   [place] [address]     Specialties     Cardiology       Lymphadenopathy  Lymphadenopathy means that your lymph glands are swollen or larger than normal. Lymph glands, also called lymph nodes, are collections of tissue that filter excess fluid, bacteria, viruses, and waste from your bloodstream. They are part of your body's disease-fighting system (immune system), which protects your body from germs. There may be different causes of lymphadenopathy, depending on where it is in your body. Some types go away on their own. Lymphadenopathy can occur anywhere that you have lymph glands, including these areas: Neck (cervical lymphadenopathy). Chest (mediastinal lymphadenopathy). Lungs (hilar lymphadenopathy). Underarms (axillary lymphadenopathy). Groin (inguinal lymphadenopathy). When your immune system responds to germs, infection-fighting cells and fluid build up in your lymph glands. This causes some swelling and enlargement. If the lymph nodes do not go back to normal size after you have an infection or disease, your health care provider may do tests. These tests help to monitor your condition and find the reason why the glands are still swollen and enlarged. Follow these instructions at home:  Get plenty of rest. Your health care provider may recommend over-the-counter medicines for pain. Take over-the-counter and prescription medicines only as told by your health care provider. If directed, apply heat to swollen lymph glands as often as told by your health care provider. Use the heat source that your health care provider recommends, such as a moist heat pack or a heating pad. Place a towel between your skin and the heat source. Leave the heat on for 20-30 minutes. Remove the heat if your skin turns  bright red. This is especially important if you are unable to feel pain, heat, or cold. You may have a greater risk of getting burned. Check your affected lymph glands every day for changes. Check other lymph gland areas as told by your health care provider. Check for changes such as: More swelling. Sudden increase in size. Redness or pain. Hardness. Keep all follow-up visits. This is important. Contact a health care provider if you have: Lymph glands that: Are still swollen after 2 weeks. Have suddenly gotten bigger or the swelling spreads. Are red, painful, or hard. Fluid leaking from the skin near an enlarged lymph gland. Problems with breathing. A fever, chills, or night sweats. Fatigue. A sore throat. Pain in your abdomen. Weight loss. Get help right away if you have: Severe pain. Chest pain. Shortness of breath. These symptoms may represent a serious problem that is an emergency. Do not wait to see if the symptoms will go away. Get medical help right away. Call your local emergency services (911 in the [place] Do not drive yourself to the hospital. Summary Lymphadenopathy means that your lymph glands are swollen or larger than normal. Lymph glands, also called lymph nodes, are collections of tissue that filter excess fluid, bacteria, viruses, and waste from the bloodstream. They are part of your body's disease-fighting system (immune system). Lymphadenopathy can occur anywhere that you have lymph glands. If the lymph nodes do not go back to normal size after you have an infection or disease, your health care provider may do tests to monitor your condition and find the reason why the glands are still swollen and enlarged. Check your affected lymph glands every day for  changes. Check other lymph gland areas as told by your health care provider. This information is not intended to replace advice given to you by your health care provider. Make sure you discuss any questions you have  with your health care provider. Document Revised: 06/09/2020 Document Reviewed: 06/09/2020 Elsevier Patient Education  [place].  Temporomandibular Joint Syndrome  Temporomandibular joint syndrome (TMJ syndrome) is a condition that causes pain in the temporomandibular joints. These joints are located near your ears and allow your jaw to open and close. For people with TMJ syndrome, chewing, biting, or other movements of the jaw can be difficult or painful. TMJ syndrome is often mild and goes away within a few weeks. However, sometimes the condition becomes a long-term (chronic) problem. What are the causes? This condition may be caused by: Grinding your teeth or clenching your jaw. Some people do this when they are stressed. Arthritis. An injury to the jaw. A head or neck injury. Teeth or dentures that are not aligned well. In some cases, the cause of TMJ syndrome may not be known. What are the signs or symptoms? The most common symptom of this condition is aching pain on the side of the head in the area of the TMJ. Other symptoms may include: Pain when moving your jaw, such as when chewing or biting. Not being able to open your jaw all the way. Making a clicking sound when you open your mouth. Headache. Earache. Neck or shoulder pain. How is this diagnosed? This condition may be diagnosed based on: Your symptoms and medical history. A physical exam. Your health care provider may check the range of motion of your jaw. Imaging tests, such as X-rays or an MRI. You may also need to see your dentist, who will check if your teeth and jaw are lined up correctly. How is this treated? TMJ syndrome often goes away on its own. If treatment is needed, it may include: Eating soft foods and applying ice or heat. Medicines to relieve pain or inflammation. Medicines or massage to relax the muscles. A splint, bite plate, or mouthpiece to prevent teeth grinding or jaw  clenching. Relaxation techniques or counseling to help reduce stress. A therapy for pain in which an electrical current is applied to the nerves through the skin (transcutaneous electrical nerve stimulation). Acupuncture. This may help to relieve pain. Jaw surgery. This is rarely needed. Follow these instructions at home:  Eating and drinking Eat a soft diet if you are having trouble chewing. Avoid foods that require a lot of chewing. Do not chew gum. General instructions Take over-the-counter and prescription medicines only as told by your health care provider. If directed, put ice on the painful area. To do this: Put ice in a plastic bag. Place a towel between your skin and the bag. Leave the ice on for 20 minutes, 2-3 times a day. Remove the ice if your skin turns bright red. This is very important. If you cannot feel pain, heat, or cold, you have a greater risk of damage to the area. Apply a warm, wet cloth (warm compress) to the painful area as told. Massage your jaw area and do any jaw stretching exercises as told by your health care provider. If you were given a splint, bite plate, or mouthpiece, wear it as told by your health care provider. Keep all follow-up visits. This is important. Where to find more information [place]: [email or web address] Contact a health care provider if: You  have trouble eating. You have new or worsening symptoms. Get help right away if: Your jaw locks. Summary Temporomandibular joint syndrome (TMJ syndrome) is a condition that causes pain in the temporomandibular joints. These joints are located near your ears and allow your jaw to open and close. TMJ syndrome is often mild and goes away within a few weeks. However, sometimes the condition becomes a long-term (chronic) problem. Symptoms include an aching pain on the side of the head in the area of the TMJ, pain when chewing or biting, and being unable to  open your jaw all the way. You may also make a clicking sound when you open your mouth. TMJ syndrome often goes away on its own. If treatment is needed, it may include medicines to relieve pain, reduce inflammation, or relax the muscles. A splint, bite plate, or mouthpiece may also be used to prevent teeth grinding or jaw clenching. This information is not intended to replace advice given to you by your health care provider. Make sure you discuss any questions you have with your health care provider. Document Revised: 03/27/2021 Document Reviewed: 03/27/2021 Elsevier Patient Education  [place].  Jaw Range of Motion Exercises Jaw range of motion exercises are exercises that help your jaw move better. Exercises that help you have good posture (postural exercises) also help relieve jaw discomfort. These are often done along with range of motion exercises. These exercises can help prevent or improve: Trouble opening your mouth. Pain in your jaw while it is open or closed. Temporomandibular joint (TMJ) pain. Headache caused by jaw tension. Take other actions to prevent or relieve jaw pain, such as: Avoiding things that cause or increase jaw pain. This may include: Chewing gum or eating hard foods. Clenching your jaw or teeth, grinding your teeth, or keeping tension in your jaw muscles. Opening your mouth wide, such as for a big yawn. Leaning on your jaw, such as resting your jaw in your hand while leaning on a desk. Putting ice on your jaw. To do this: Put ice in a plastic bag. Place a towel between your skin and the bag. Leave the ice on for 20 minutes, 2-3 times a day. Remove the ice if your skin turns bright red. This is very important. If you cannot feel pain, heat, or cold, you have a greater risk of damage to the area. Only do jaw exercises that your health care provider recommends. Only move your jaw as far as it can comfortably go in each direction. Do not move your jaw into  positions that cause pain. Range of motion exercises Repeat each of these exercises 8 times, 1-2 times a day, or as told by your health care provider. Exercise A: Forward protrusion  Push your jaw forward. Hold this position for 1-2 seconds. Let your jaw return to its normal position and rest it there for 1-2 seconds. Exercise B: Controlled opening  Stand or sit in front of a mirror. Place your tongue on the roof of your mouth, just behind your top teeth. Keeping your tongue on the roof of your mouth, slowly open and close your mouth. While you open and close your mouth, watch your jaw in the mirror. Try to keep your jaw from moving to one side or the other. Exercise C: Right and left motion  Move your jaw right. Hold this position for 1-2 seconds. Let your jaw return to its normal position, and rest it there for 1-2 seconds. Move your jaw left. Hold this  position for 1-2 seconds. Let your jaw return to its normal position, and rest it there for 1-2 seconds. Postural exercises Exercise A: Chin tucks  You can do this exercise sitting, standing, or lying down. Move your head straight back, keeping your head level. You can guide the movement by placing your fingers on your chin to push your jaw back in an even motion. You should be able to feel a double chin form at the end of the motion. Hold this position for 5 seconds. Repeat 10-15 times. Exercise B: Shoulder blade squeeze  Sit or stand. Bend your elbows to about 90 degrees, which is the shape of a capital letter "L." Keep your upper arms by your body. Squeeze your shoulder blades down and back, as though you were trying to touch your elbows behind you. Do not shrug your shoulders or move your head. Hold this position for 5 seconds. Repeat 10-15 times. Exercise C: Chest stretch  Stand in a doorway with one of your feet slightly in front of the other. This is called a staggered stance. If you cannot reach your forearms to the door frame,  do this exercise in a corner of a room. Put both of your hands and your forearms on the door frame, with your arms wide apart. Make sure your arms are at a 90-degree angle to your body. Place your hands on the door frame at the height of your elbows. Slowly move your weight onto your front foot until you feel a stretch across your chest and in the front of your shoulders. Keep your head and chest upright and keep your abdominal muscles tight. Do not lean in. Hold this position for 30 seconds. Repeat 3 times. Contact a health care provider if: You have jaw pain that is new or gets worse. You have clicking or popping sounds while doing the exercises. Get help right away if: Your jaw is stuck in one place and you cannot move it. You cannot open or close your mouth. Summary Jaw range of motion exercises are exercises that help your jaw move better. Take actions to prevent or relieve jaw pain: limit chewing gum or eating hard foods; clenching your jaw or teeth; or leaning on your jaw, such as resting your jaw in your hand while leaning on a desk. Repeat each of the jaw range of motion exercises 8 times, 1-2 times a day, or as told by your health care provider. Contact a health care provider if you have clicking or popping sounds while doing the exercises. This information is not intended to replace advice given to you by your health care provider. Make sure you discuss any questions you have with your health care provider. Document Revised: 03/27/2021 Document Reviewed: 03/27/2021 Elsevier Patient Education  [place].  Jaw Dislocation  Jaw dislocation is displacement of the temporomandibular joint, which is the place where the upper jawbone (maxilla) and the lower jawbone (mandible) meet. Soon after the dislocation, the jaw muscles tighten, causing pain and difficulty moving the mouth and jaw normally. What are the causes? Common causes of this condition include: A hard, direct hit or  injury (trauma) to the jaw. Opening the mouth too widely. This can be caused by: Eating. Yawning. Laughing. Vomiting. Having a dental procedure. Receiving medicine by mouth to make you fall asleep (general anesthetic). Seizures or a side effect of certain medicines (dystonic reaction). What increases the risk? You are more likely to develop this condition if: You have dislocated your  jaw before. You play contact sports. Your jaw is loose or can move beyond the normal range. You have certain connective tissue disorders that can make the jaw ligaments looser, such as Marfan syndrome or Ehlers-Danlos syndrome. Ligaments are tissues that connect bones to each other. What are the signs or symptoms? Symptoms of this condition vary depending on the severity of the dislocation. Symptoms may include: Severe pain, especially when trying to move your jaw. Not being able to move your jaw or close your mouth completely or at all. Feeling that your teeth do not line up as they usually do when you bite. Drooling. Trouble speaking or swallowing. How is this diagnosed? This condition is diagnosed based on your medical history and a physical exam. Your health care provider will ask you to move your jaw, and he or she will feel your temporomandibular joints. Your health care provider will also check the inside of your mouth for: Breaks (fractures) in your jawbone. Cuts (lacerations). In certain cases, such as those involving trauma, imaging studies such as X-rays or CT scans may be done. These help to make sure there is no other underlying injury, such as a facial fracture. How is this treated? The most common treatment for this condition is to have your health care provider move your jaw back into place by hand (manual reduction). In difficult cases, medicines may be given for pain to reduce muscle tension before the procedure. Your health care provider will determine if this is appropriate. Also, your  health care provider may recommend: NSAIDs, such as ibuprofen, to reduce pain and swelling. Medicines to relax your muscles. A neck brace to support your lower jawbone. A temporary elastic bandage that wraps around your jaw and your head, to make sure your jaw does not dislocate after the reduction. Follow these instructions at home: Eating and drinking Follow instructions from your health care provider about eating or drinking restrictions while your jaw is healing. These may include eating: Soft foods that do not require a lot chewing. Liquefied foods. Food that has been cut into small pieces. For the first several days, make sure to take only small, measured bites. Avoid overextending your jaw when you eat. If you have a brace or bandage: Wear the brace or bandage as told by your health care provider. Remove it only as told by your health care provider. Check the skin around the brace or bandage every day. Tell your health care provider about any concerns. Loosen the brace or bandage if your jaw feels numb. Keep the brace or bandage clean. If the brace or bandage is not waterproof: Do not let it get wet. Cover it with a watertight covering when you take a bath or shower. Managing pain, stiffness, and swelling If directed, put ice on the injured area. To do this: If you have a brace or bandage, remove it as told by your health care provider. Put ice in a plastic bag. Place a towel between your skin and the bag. Leave the ice on for 20 minutes, 2-3 times a day. Remove the ice if your skin turns bright red. This is very important. If you cannot feel pain, heat, or cold, you have a greater risk of damage to the area.  Activity Do not open your mouth widely until your health care provider says that it is safe for you to do that. Rest your jaw as told by your health care provider. Avoid activities similar to the one that caused your injury.  Avoid opening your jaw widely when you laugh or  yawn. If you need to sneeze or yawn, place a hand under your jaw to prevent it from opening too widely. General instructions Take over-the-counter and prescription medicines only as told by your health care provider. Do not take baths, swim, or use a hot tub until your health care provider approves. Ask your health care provider if you may take showers. You may only be allowed to take sponge baths. Do not use any products that contain nicotine or tobacco. These products include cigarettes, chewing tobacco, and vaping devices, such as e-cigarettes. If you need help quitting, ask your health care provider. Keep all follow-up visits. This is important. Contact a health care provider if: Your pain gets worse. Medicines do not help your pain. Get help right away if: Your jaw becomes dislocated again. You have difficulty breathing, eating, or swallowing. These symptoms may represent a serious problem that is an emergency. Do not wait to see if the symptoms will go away. Get medical help right away. Call your local emergency services (911 in the [place] Do not drive yourself to the hospital. Summary Jaw dislocation is displacement of the temporomandibular joint, which is the place where the upper and lower jawbones meet. This is often caused by trauma or from opening your mouth too widely. Jaw dislocation causes tightening of the jaw muscles, difficulty closing your mouth, and pain in your jaw. The most common treatment for this condition is to have your health care provider move your jaw back into place by hand (manual reduction). This may require pain medicine to help make the reduction easier. This information is not intended to replace advice given to you by your health care provider. Make sure you discuss any questions you have with your health care provider. Document Revised: 12/08/2020 Document Reviewed: 12/08/2020 Elsevier Patient Education  [place].

## 2022-04-20 NOTE — Progress Notes (Addendum)
Chief Complaint  Patient presents with   Neck Pain    Pt believes either his lymph node or tmj is bothering him early in am and when he tries to sleep. Pt mentions his lymph nodes have been swollen he has been dealing with it for 6 weeks. It starts in the back of his neck and up to his cheek bone.   F/u  1. Left cervical lateral submandibular gland x 6 weeks and left lateral posterior neck pain and left jaw is puff at times in the am he has a dental upcoming appt and has night guards he has not been wearing but started wearing again. He was c/w TMJ      Review of Systems  Constitutional:  Negative for weight loss.  HENT:  Negative for hearing loss.   Eyes:  Negative for blurred vision.  Respiratory:  Negative for shortness of breath.   Cardiovascular:  Negative for chest pain.  Gastrointestinal:  Negative for abdominal pain and blood in stool.  Musculoskeletal:  Negative for back pain.  Skin:  Negative for rash.  Neurological:  Negative for headaches.  Psychiatric/Behavioral:  Negative for depression.    Past Medical History:  Diagnosis Date   Arthritis    Atrial fibrillation ([place])    per pt intermittent since 2003    Biceps tendinitis of right shoulder    [facility]   Claustrophobia    COVID-19    07/2020 x 5 days bad cold   Dysrhythmia    Multiple thyroid nodules    Palpitations    PVC's (premature ventricular contractions)    Rotator cuff tear    s/p surgery in 2000 now has tear per MRI orth[facility] Dr. Edd Fabian last steroid shot (right)   Past Surgical History:  Procedure Laterality Date   KNEE SURGERY Left    left shoulder surgery     [facility]. Raliegh Ip    MOLE REMOVAL  2004   Dr. Collene Schlichter   SHOULDER SURGERY Right 1999/2000   x1   TONSILLECTOMY     1956   Family History  Problem Relation Age of Onset   Melanoma Father        hand and arms   Arthritis Father    Cancer Father        throat dx age 68    Heart disease Father         MI age 51 and CABG hx    Arthritis Mother    Stroke Mother        age 38   Heart disease Mother        heart valve replaced older age    Renal Disease Paternal Grandmother        [place]    Social History   Socioeconomic History   Marital status: Single    Spouse name: Not on file   Number of children: Not on file   Years of education: Not on file   Highest education level: Not on file  Occupational History   Not on file  Tobacco Use   Smoking status: Never   Smokeless tobacco: Never  Vaping Use   Vaping Use: Never used  Substance and Sexual Activity   Alcohol use: Yes    Comment: socially   Drug use: No   Sexual activity: Yes    Comment: women   Other Topics Concern   Not on file  Social History Narrative   Masters, 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    Social Determinants of Health   Financial Resource Strain: Low Risk  (07/05/2021)   Overall Financial Resource Strain (CARDIA)    Difficulty of Paying Living Expenses: Not hard at all  Food Insecurity: No Food Insecurity (07/05/2021)   Hunger Vital Sign    Worried About Running Out of Food in the Last Year: Never true    Ran Out of Food in the Last Year: Never true  Transportation Needs: No Transportation Needs (07/05/2021)   PRAPARE - Hydrologist (Medical): No    Lack of Transportation (Non-Medical): No  Physical Activity: Not on file  Stress: No Stress Concern Present (07/05/2021)   [place]    Feeling of Stress : Not at all  Social Connections: Unknown (07/05/2021)   Social Connection and Isolation Panel [NHANES]    Frequency of Communication with Friends and Family: More than three times a week    Frequency of Social Gatherings with Friends and Family: More than three times a week    Attends Religious Services: Not on file    Active Member of Clubs or Organizations: Not on file    Attends Theatre manager Meetings: Not on file    Marital Status: Not on file  Intimate Partner Violence: Not At Risk (07/05/2021)   Humiliation, Afraid, Rape, and Kick questionnaire    Fear of Current or Ex-Partner: No    Emotionally Abused: No    Physically Abused: No    Sexually Abused: No   Current Meds  Medication Sig   Lysine 500 MG TABS Take 500 mg by mouth daily.    Multiple Vitamins-Minerals (MULTIVITAMIN ADULT PO) Take 1 tablet by mouth daily.    Red Yeast Rice 600 MG CAPS Take 600 mg by mouth in the morning and at bedtime.    Allergies  Allergen Reactions   Other Other (See Comments)    Avoid fluoroquinolones d/t aortic root dilatation    Wheat Bran     WHOLE WHEAT   Recent Results (from the past 2160 hour(s))  Vitamin B12     Status: None   Collection Time: 01/27/22  9:03 AM  Result Value Ref Range   Vitamin B-12 398 211 - 911 pg/mL  Testosterone     Status: None   Collection Time: 01/27/22  9:03 AM  Result Value Ref Range   Testosterone 319.49 300.00 - 890.00 ng/dL  PSA     Status: None   Collection Time: 01/27/22  9:03 AM  Result Value Ref Range   PSA 0.99 0.10 - 4.00 ng/mL    Comment: Test performed using Access Hybritech PSA Assay, a parmagnetic partical, chemiluminecent immunoassay.  TSH     Status: None   Collection Time: 01/27/22  9:03 AM  Result Value Ref Range   TSH 1.58 0.35 - 5.50 uIU/mL  CBC with Differential/Platelet     Status: Abnormal   Collection Time: 01/27/22  9:03 AM  Result Value Ref Range   WBC 6.8 4.0 - 10.5 K/uL   RBC 5.01 4.22 - 5.81 Mil/uL   Hemoglobin 15.8 13.0 - 17.0 g/dL   HCT 46.9 39.0 - 52.0 %   MCV 93.7 78.0 - 100.0 fl   MCHC 33.7 30.0 - 36.0 g/dL   RDW 12.6 11.5 - 15.5 %   Platelets 169.0 150.0 - 400.0 K/uL   Neutrophils Relative % 48.6 43.0 - 77.0 %  Lymphocytes Relative 34.3 12.0 - 46.0 %   Monocytes Relative 13.6 (H) 3.0 - 12.0 %   Eosinophils Relative 2.8 0.0 - 5.0 %   Basophils Relative 0.7 0.0 - 3.0 %   Neutro Abs 3.3 1.4  - 7.7 K/uL   Lymphs Abs 2.3 0.7 - 4.0 K/uL   Monocytes Absolute 0.9 0.1 - 1.0 K/uL   Eosinophils Absolute 0.2 0.0 - 0.7 K/uL   Basophils Absolute 0.0 0.0 - 0.1 K/uL  Hemoglobin A1c     Status: None   Collection Time: 01/27/22  9:03 AM  Result Value Ref Range   Hgb A1c MFr Bld 5.7 4.6 - 6.5 %    Comment: Glycemic Control Guidelines for People with Diabetes:Non Diabetic:  <6%Goal of Therapy: <7%Additional Action Suggested:  >8%   Lipid panel     Status: Abnormal   Collection Time: 01/27/22  9:03 AM  Result Value Ref Range   Cholesterol 169 0 - 200 mg/dL    Comment: ATP III Classification       Desirable:  < 200 mg/dL               Borderline High:  200 - 239 mg/dL          High:  > = 240 mg/dL   Triglycerides 179.0 (H) 0.0 - 149.0 mg/dL    Comment: Normal:  <150 mg/dLBorderline High:  150 - 199 mg/dL   HDL 39.70 >39.00 mg/dL   VLDL 35.8 0.0 - 40.0 mg/dL   LDL Cholesterol 93 0 - 99 mg/dL   Total CHOL/HDL Ratio 4     Comment:                Men          Women1/2 Average Risk     3.4          3.3Average Risk          5.0          4.42X Average Risk          9.6          7.13X Average Risk          15.0          11.0                       NonHDL 129.18     Comment: NOTE:  Non-HDL goal should be 30 mg/dL higher than patient's LDL goal (i.e. LDL goal of < 70 mg/dL, would have non-HDL goal of < 100 mg/dL)  Comprehensive metabolic panel     Status: None   Collection Time: 01/27/22  9:03 AM  Result Value Ref Range   Sodium 139 135 - 145 mEq/L   Potassium 4.1 3.5 - 5.1 mEq/L   Chloride 104 96 - 112 mEq/L   CO2 28 19 - 32 mEq/L   Glucose, Bld 88 70 - 99 mg/dL   BUN 19 6 - 23 mg/dL   Creatinine, Ser 0.93 0.40 - 1.50 mg/dL   Total Bilirubin 0.7 0.2 - 1.2 mg/dL   Alkaline Phosphatase 55 39 - 117 U/L   AST 22 0 - 37 U/L   ALT 22 0 - 53 U/L   Total Protein 6.6 6.0 - 8.3 g/dL   Albumin 4.4 3.5 - 5.2 g/dL   GFR 81.70 >60.00 mL/min    Comment: Calculated using the CKD-EPI Creatinine Equation  (2021)   Calcium 9.2 8.4 - 10.5 mg/dL  Objective  Body mass index is 27.58 kg/m. Wt Readings from Last 3 Encounters:  04/20/22 181 lb 6.4 oz (82.3 kg)  01/20/22 183 lb 12.8 oz (83.4 kg)  07/05/21 180 lb (81.6 kg)   Temp Readings from Last 3 Encounters:  04/20/22 98 F (36.7 C) (Oral)  01/20/22 97.6 F (36.4 C) (Oral)  12/14/20 97.6 F (36.4 C) (Temporal)   BP Readings from Last 3 Encounters:  04/20/22 138/80  01/20/22 118/70  05/31/21 (!) 144/76   Pulse Readings from Last 3 Encounters:  04/20/22 85  01/20/22 (!) 48  05/31/21 (!) 44    Physical Exam Vitals and nursing note reviewed.  Constitutional:      Appearance: Normal appearance. He is well-developed and well-groomed.  HENT:     Head: Normocephalic and atraumatic.  Eyes:     Conjunctiva/sclera: Conjunctivae normal.     Pupils: Pupils are equal, round, and reactive to light.  Cardiovascular:     Rate and Rhythm: Normal rate and regular rhythm.     Heart sounds: Normal heart sounds.  Pulmonary:     Effort: Pulmonary effort is normal. No respiratory distress.     Breath sounds: Normal breath sounds.  Abdominal:     Tenderness: There is no abdominal tenderness.  Skin:    General: Skin is warm and moist.  Neurological:     General: No focal deficit present.     Mental Status: He is alert and oriented to person, place, and time. Mental status is at baseline.     Sensory: Sensation is intact.     Motor: Motor function is intact.     Coordination: Coordination is intact.     Gait: Gait is intact. Gait normal.  Psychiatric:        Attention and Perception: Attention and perception normal.        Mood and Affect: Mood and affect normal.        Speech: Speech normal.        Behavior: Behavior normal. Behavior is cooperative.        Thought Content: Thought content normal.        Cognition and Memory: Cognition and memory normal.        Judgment: Judgment normal.     Assessment  Plan  Cervical  lymphadenopathy left submandibular  Lymphadenopathy - Plan: US Soft Tissue Head/Neck (NON-THYROID) If negative consider CT neck and Ct ab/pelvis to w/u etiology GI issues I.e, deeper evaluation of neck as cause  H/o thyroid nodules US thyroid due 09/2022 but if CT neck done may not need to do US thyroid for stability of thyroid nodule  He will also f/u [facility] for left jaw swelling though we discussed imaging I.e Xray of jaw as well but he can have [facility] at [facility] upcoming appt  My chart message 05/08/22  Symptoms of expansion Include slight  consistent numbness on the fullness of my left side of my face from under my left eye down to under the Joe line with about a level one too of discomfort. -->will order CT neck, ab/pelvis as prev GI etiology ie pancreas mass found in another pt caused cervical lymphadenopathy pt also has only done cologuard and never had colonoscopy   He c/o facial numbness this may warrant brain imaging I.e MRI brain in the future   Cervicalgia - Plan: DG Cervical Spine Complete =cervical arthritis   CT results will refer to ENT Dr. Pryor Ochoa or [facility]  Mastoids and visualized paranasal sinuses:  Well aerated. Mild maxillary alveolar recess mucosal thickening or retention cysts.   Skeleton: Cervical spine degeneration. No acute or suspicious osseous lesion identified.   Upper chest: Negative.   IMPRESSION: 1. Largely negative CT appearance of the Neck when allowing for motion artifact. No abnormal lymph nodes in the neck.   2. Mild left sublingual space sialolithiasis. Cervical spine degeneration. Generalized intracranial artery tortuosity.     Electronically Signed   By: Genevie Ann M.D.   On: 05/20/2022 07:07 HM Flu will get at [facility] had 06/04/15, pna 23 utd ,shingles had 05/25/14 (zostervax)  -confirm with pharmacy and if not had shingrix had 1/2 logged check 2nd dose  Tdap utd 12/04/17  hep B vaccine given Rx prev to get this  covid 4/4  moderna further at pharmacy    cologuard neg 08/05/18 normal ordered  Never smoker Hep C neg 04/2016  PSA 0.99 01/27/22  rec hep B vaccine 03/14/17 titer low <10. sAg and core total neg  [place] [facility] seen 07/31/2019 bx pending   Rec healthy diet and exercise     IMPRESSION: 1. Similar findings of borderline thyromegaly without new or enlarging thyroid nodule. 2. Nodule #1 is unchanged compared to the 2019 examination though again barely meets imaging criteria to recommend annual/biannual surveillance. This examination documents 29 months of stability. Follow-up examination in 09/2022 would ensure 5 years of stability and thus a benign etiology. 3. Nodule #2 has undergone cystic degeneration and as such no longer meets imaging criteria to recommend continued dedicated follow-up.   The above is in keeping with the ACR TI-RADS recommendations - J Am Coll Radiol 2017;14:587-595.     Electronically Signed   By: Sandi Mariscal M.D.   On: 03/01/2021 13:45   EP Dr. Caryl Comes   Provider: Dr. Olivia Mackie McLean-Scocuzza-[facility]

## 2022-04-24 IMAGING — US US THYROID
1 series · 13 of 25 positions shown · non-contrast
Comparison: 02/28/2020; 12/16/2018; 10/19/2017

CLINICAL DATA: Prior ultrasound follow-up. Follow-up thyroid
nodules

EXAM:
THYROID ULTRASOUND
TECHNIQUE: Ultrasound examination of the thyroid gland and adjacent soft
tissues was performed.

[Series 1: us thyroid · 0.08mm/px · 13 of 42 slices shown]
[im 1/42]
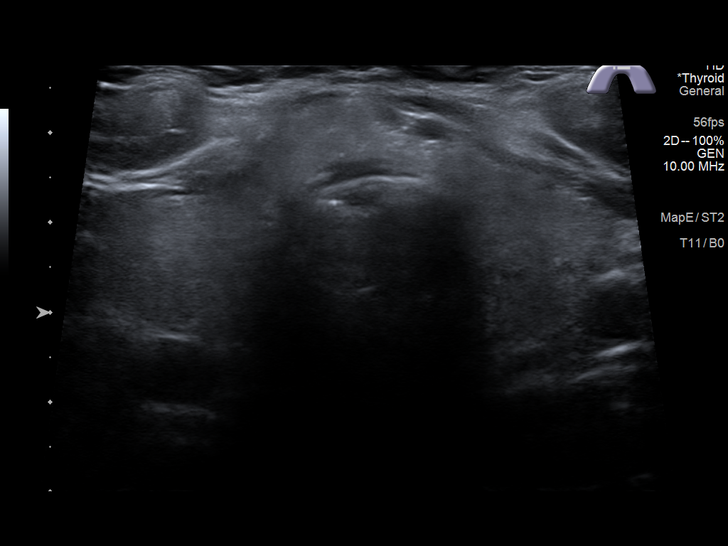
[im 4/42]
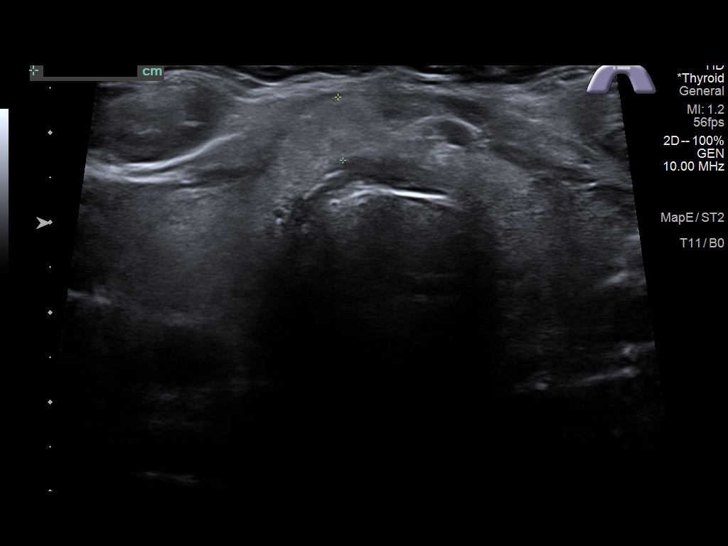
[im 7/42]
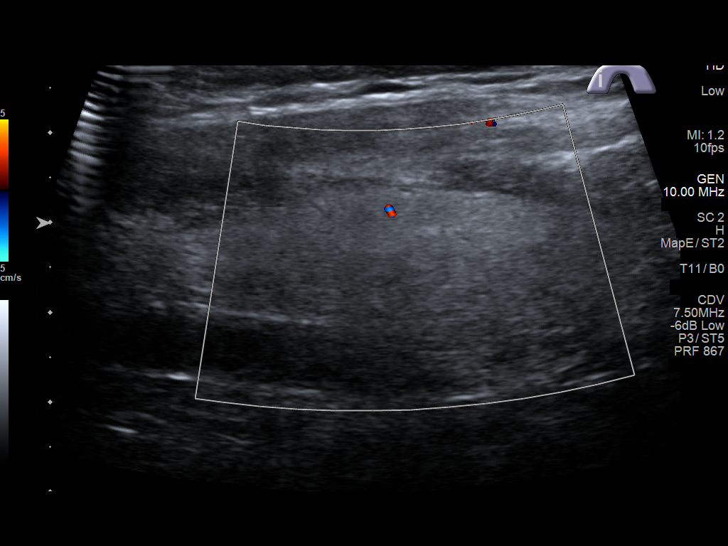
[im 11/42]
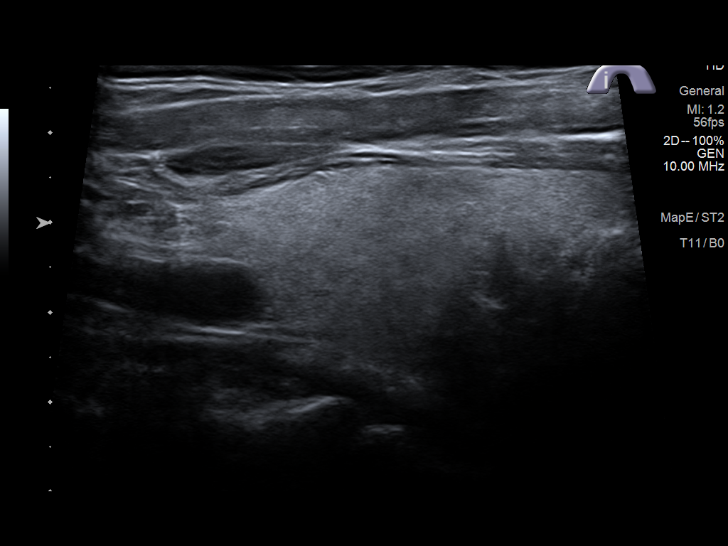
[im 14/42]
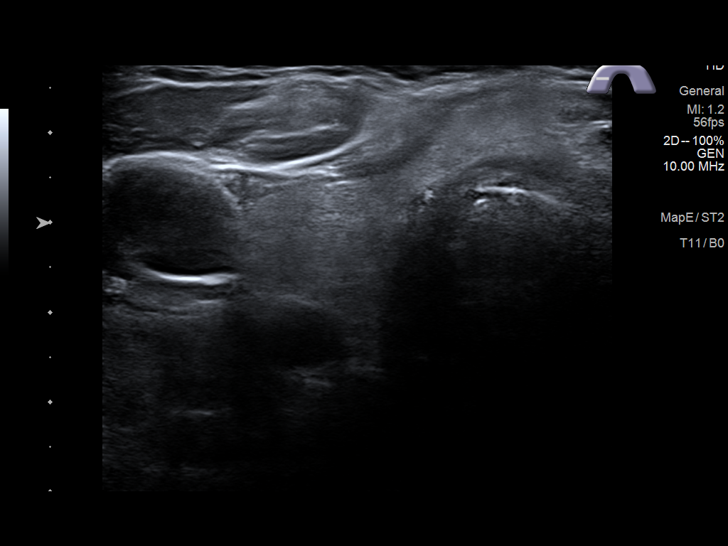
[im 18/42]
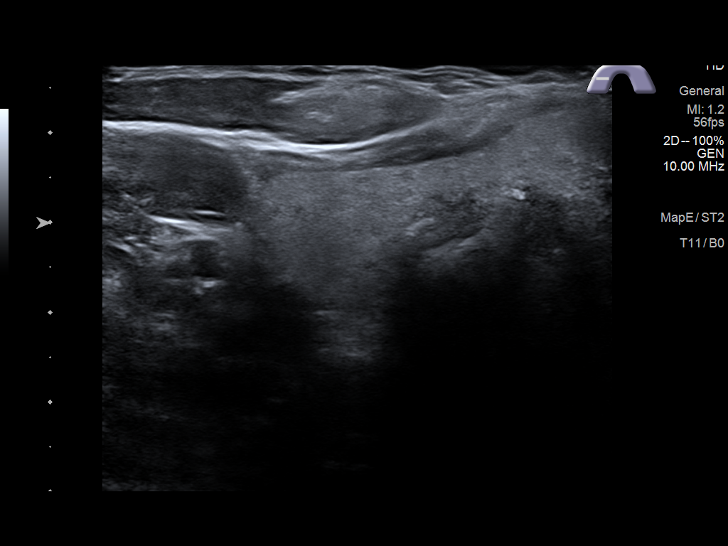
[im 21/42]
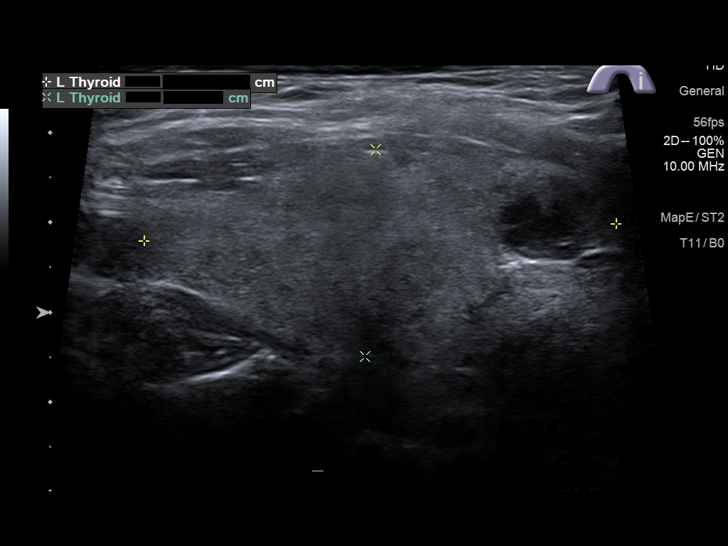
[im 24/42]
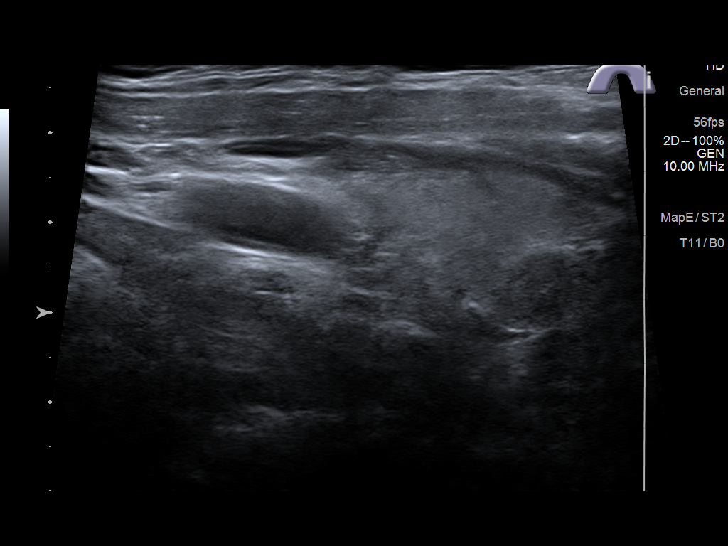
[im 28/42]
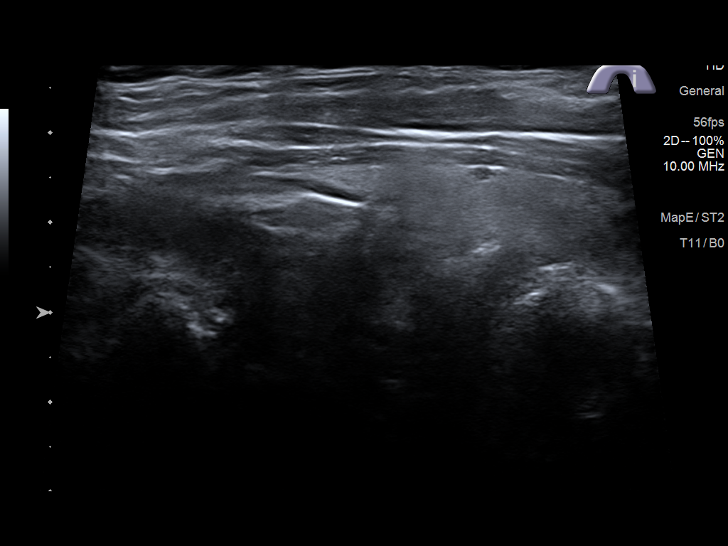
[im 31/42]
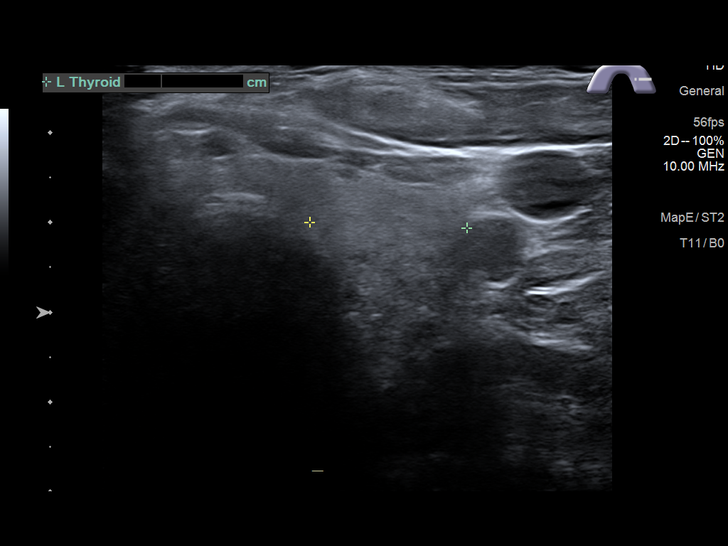
[im 35/42]
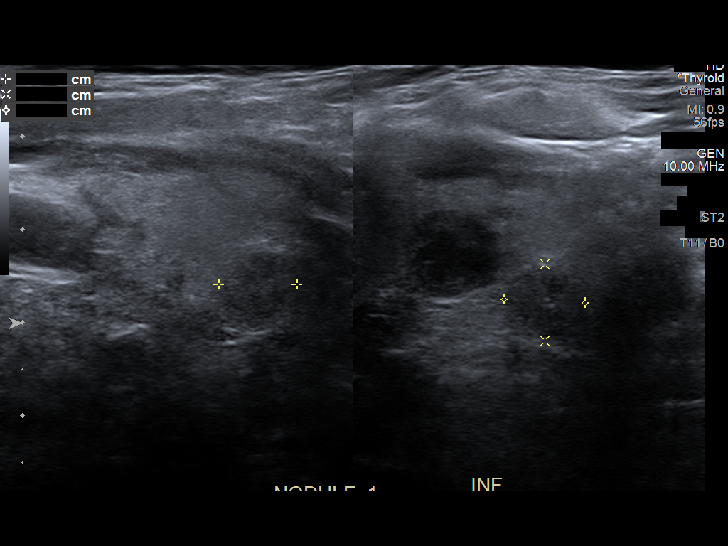
[im 38/42]
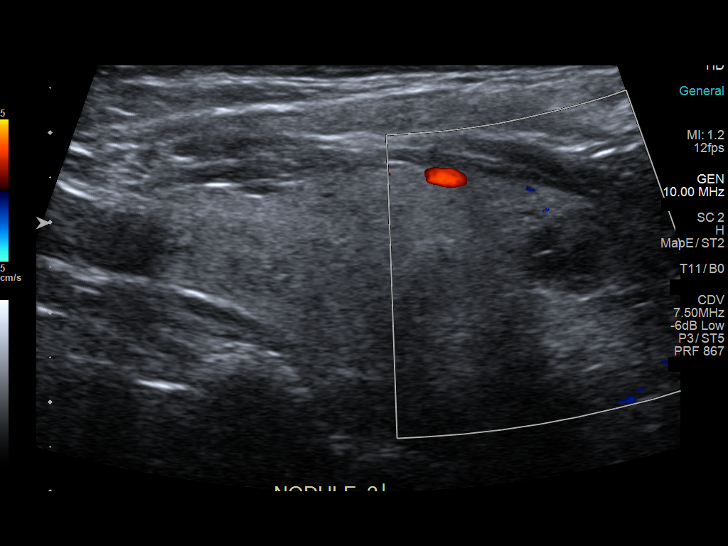
[im 42/42]
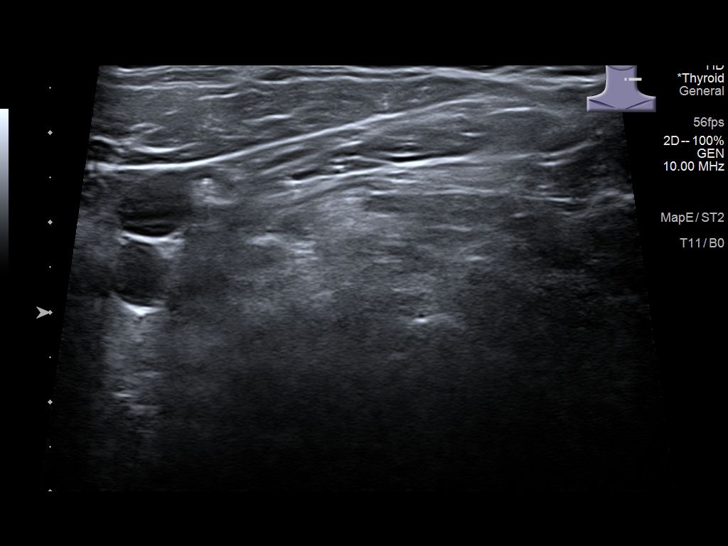

[13 of 25 positions shown; findings below may reference images not displayed]

FINDINGS: Parenchymal Echotexture: Mildly heterogenous

Isthmus: Normal in size measures 0.7 cm in diameter, unchanged

Right lobe: Borderline enlarged measuring 5.2 x 1.1 x 1.8 cm,
previously, 4.9 x 2.3 x 1.7 cm

Left lobe: There is borderline enlarged measuring 5.3 x 2.3 x
cm, previously, 5.3 x 2.2 x 1.7 cm

_________________________________________________________

Estimated total number of nodules >/= 1 cm: 1

Number of spongiform nodules >/=  2 cm not described below (TR1): 0

Number of mixed cystic and solid nodules >/= 1.5 cm not described
below (TR2): 0

_________________________________________________________

Nodule # 1:

Prior biopsy: No

Location: Left; Inferior

Maximum size: 0.9 cm; Other 2 dimensions: 0.8 x 0.8 cm, previously,
1.0 x 0.8 x 0.8 cm

Composition: solid/almost completely solid (2)

Echogenicity: hypoechoic (2)

Shape: not taller-than-wide (0)

Margins: ill-defined (0)

Echogenic foci: none (0)

ACR TI-RADS total points: 4.

ACR TI-RADS risk category:  TR4 (4-6 points).

Significant change in size (>/= 20% in two dimensions and minimal
increase of 2 mm): No

Change in features: No

Change in ACR TI-RADS risk category: No

ACR TI-RADS recommendations:

*Given size (>/= 1 - 1.4 cm) and appearance, a follow-up ultrasound
in 1 year should be considered based on TI-RADS criteria.

_________________________________________________________

The approximately 1.2 x 1.2 x 0.9 cm partially solid though
predominantly cystic nodule within the inferior pole of the left
lobe of the thyroid (labeled 2), has undergone interval cystic
degeneration compared to the [DATE] examination, a typically benign
finding and as such no longer meets imaging criteria to recommend
continued dedicated follow-up.
IMPRESSION: 1. Similar findings of borderline thyromegaly without new or
enlarging thyroid nodule.
2. Nodule #1 is unchanged compared to the 1610 examination though
again barely meets imaging criteria to recommend annual/biannual
surveillance. This examination documents 29 months of stability.
and thus a benign etiology.
3. Nodule #2 has undergone cystic degeneration and as such no longer
meets imaging criteria to recommend continued dedicated follow-up.

The above is in keeping with the ACR TI-RADS recommendations - [HOSPITAL] 9735;[DATE].

## 2022-05-02 ENCOUNTER — Encounter: Payer: Self-pay | Admitting: Cardiovascular Disease

## 2022-05-05 ENCOUNTER — Ambulatory Visit
Admission: RE | Admit: 2022-05-05 | Discharge: 2022-05-05 | Disposition: A | Discharge status: Home / Self Care | Payer: Medicare HMO | Source: Ambulatory Visit | Attending: Internal Medicine | Admitting: Internal Medicine

## 2022-05-05 DIAGNOSIS — R590 Localized enlarged lymph nodes: Secondary | ICD-10-CM | POA: Diagnosis not present

## 2022-05-05 DIAGNOSIS — R591 Generalized enlarged lymph nodes: Secondary | ICD-10-CM | POA: Insufficient documentation

## 2022-05-07 ENCOUNTER — Encounter: Payer: Self-pay | Admitting: Internal Medicine

## 2022-05-08 NOTE — Addendum Note (Signed)
Addended by: Orland Mustard on: 05/08/2022 08:25 PM   Modules accepted: Orders

## 2022-05-10 NOTE — Progress Notes (Unsigned)
Cardiology Office Note   Date:  05/11/2022   ID:  James Carrillo, DOB Oct 06, 1950, MRN [id number]  PCP:  McLean-Scocuzza, Nino Glow, MD  Cardiologist:   Kathlyn Sacramento, MD   Chief Complaint  Patient presents with   Other    OD f/u nio complaints todfay. Meds reviewed verbally with pt.      History of Present Illness: James Carrillo is a 71 y.o. male who is here today for follow-up regarding PVCs and mildly dilated aortic root.  He is not diabetic and has no history of hypertension.  He does have hyperlipidemia currently not on medications.  He is not a smoker and drinks only occasional beer.  He drinks 1 cup of coffee daily with no other caffeinated products. He has no family history of premature coronary artery disease, sudden death or arrhythmia.  His father did have an MI at the age of 8 and his mother had valve replacement.  Was seen last year for symptomatic PVCs.  Outpatient monitor showed 36% burden.  I referred him to Dr. Caryl Comes and the patient was started on metoprolol with subsequent improvement in symptoms.  He is doing well with no chest pain, shortness of breath or syncope. Lexiscan Myoview in April of this year showed no evidence of ischemia with normal ejection fraction.   Past Medical History:  Diagnosis Date   Arthritis    Atrial fibrillation ([place])    per pt intermittent since 2003    Biceps tendinitis of right shoulder    [facility] [place] 12[facility]01/20   Claustrophobia    COVID-19    07/2020 x 5 days bad cold and 09/03/20   Dysrhythmia    Multiple thyroid nodules    Palpitations    PVC's (premature ventricular contractions)    Rotator cuff tear    s/p surgery in 2000 now has tear per MRI ortho [facility] Dr. Edd Fabian last steroid shot (right)    Past Surgical History:  Procedure Laterality Date   KNEE SURGERY Left    left shoulder surgery     [facility] Dr. Raliegh Ip    MOLE REMOVAL  2004   Dr. Collene Schlichter   SHOULDER SURGERY Right 1999/2000   x1    TONSILLECTOMY     1956     Current Outpatient Medications  Medication Sig Dispense Refill   IBUPROFEN PO Take by mouth as needed.     Lysine 500 MG TABS Take 500 mg by mouth daily.      Multiple Vitamins-Minerals (MULTIVITAMIN ADULT PO) Take 1 tablet by mouth daily.      Red Yeast Rice 600 MG CAPS Take 600 mg by mouth in the morning and at bedtime.      Saw Palmetto, Serenoa repens, (SAW PALMETTO PO) Take by mouth daily at 8 pm.     No current facility-administered medications for this visit.    Allergies:   Other and Wheat bran    Social History:  The patient  reports that he has never smoked. He has never used smokeless tobacco. He reports current alcohol use. He reports that he does not use drugs.   Family History:  The patient's family history includes Arthritis in his father and mother; Cancer in his father; Heart disease in his father and mother; Melanoma in his father; Renal Disease in his paternal grandmother; Stroke in his mother.    ROS:  Please see the history of present illness.   Otherwise, review of systems are positive  for none.   All other systems are reviewed and negative.    PHYSICAL EXAM: VS:  BP (!) 112/90 (BP Location: Left Arm, Patient Position: Sitting, Cuff Size: Normal)   Pulse 83   Ht '5\' 8"'$  (1.727 m)   Wt 182 lb 4 oz (82.7 kg)   SpO2 97%   BMI 27.71 kg/m  , BMI Body mass index is 27.71 kg/m. GEN: Well nourished, well developed, in no acute distress  HEENT: normal  Neck: no JVD, carotid bruits, or masses Cardiac: RRR with  premature beats; no murmurs, rubs, or gallops,no edema  Respiratory:  clear to auscultation bilaterally, normal work of breathing GI: soft, nontender, nondistended, + BS MS: no deformity or atrophy  Skin: warm and dry, no rash Neuro:  Strength and sensation are intact Psych: euthymic mood, full affect   EKG:  EKG is not ordered today.    Recent Labs: 01/27/2022: ALT 22; BUN 19; Creatinine, Ser 0.93; Hemoglobin 15.8;  Platelets 169.0; Potassium 4.1; Sodium 139; TSH 1.58    Lipid Panel    Component Value Date/Time   CHOL 169 01/27/2022 0903   TRIG 179.0 (H) 01/27/2022 0903   HDL 39.70 01/27/2022 0903   CHOLHDL 4 01/27/2022 0903   VLDL 35.8 01/27/2022 0903   LDLCALC 93 01/27/2022 0903   LDLDIRECT 95.0 08/20/2019 0937      Wt Readings from Last 3 Encounters:  05/11/22 182 lb 4 oz (82.7 kg)  04/20/22 181 lb 6.4 oz (82.3 kg)  01/20/22 183 lb 12.8 oz (83.4 kg)          07/12/2018   11:19 AM  PAD Screen  Previous PAD dx? No  Previous surgical procedure? No  Pain with walking? No  Feet/toe relief with dangling? No  Painful, non-healing ulcers? No  Extremities discolored? No      ASSESSMENT AND PLAN:  1.  Symptomatic PVCs: Reasonably controlled with current dose of metoprolol.  Echocardiogram has shown normal LV systolic function.   I will consider referral to EP.  2.  Mildly dilated aortic root: Agree with yearly surveillance.   Disposition:   Continue follow-up with Dr. Caryl Comes as planned.  He can follow-up with me as needed.  Signed,  Kathlyn Sacramento, MD  05/11/2022 10:48 AM    [place]

## 2022-05-11 ENCOUNTER — Encounter: Payer: Self-pay | Admitting: Cardiovascular Disease

## 2022-05-11 ENCOUNTER — Ambulatory Visit: Payer: Medicare HMO | Attending: Cardiovascular Disease | Admitting: Cardiovascular Disease

## 2022-05-11 VITALS — BP 112/90 | HR 83 | Ht 68.0 in | Wt 182.2 lb

## 2022-05-11 DIAGNOSIS — I77810 Thoracic aortic ectasia: Secondary | ICD-10-CM | POA: Diagnosis not present

## 2022-05-11 DIAGNOSIS — I471 Supraventricular tachycardia: Secondary | ICD-10-CM

## 2022-05-11 DIAGNOSIS — I493 Ventricular premature depolarization: Secondary | ICD-10-CM | POA: Diagnosis not present

## 2022-05-11 NOTE — Patient Instructions (Signed)
Medication Instructions:  Your physician recommends that you continue on your current medications as directed. Please refer to the Current Medication list given to you today.  *If you need a refill on your cardiac medications before your next appointment, please call your pharmacy*   Lab Work: None ordered If you have labs (blood work) drawn today and your tests are completely normal, you will receive your results only by: MyChart Message (if you have MyChart) OR A paper copy in the mail If you have any lab test that is abnormal or we need to change your treatment, we will call you to review the results.   Testing/Procedures: None ordered   Follow-Up: At [facility], you and your health needs are our priority.  As part of our continuing mission to provide you with exceptional heart care, we have created designated Provider Care Teams.  These Care Teams include your primary Cardiologist (physician) and Advanced Practice Providers (APPs -  Physician Assistants and Nurse Practitioners) who all work together to provide you with the care you need, when you need it.  We recommend signing up for the patient portal called "MyChart".  Sign up information is provided on this After Visit Summary.  MyChart is used to connect with patients for Virtual Visits (Telemedicine).  Patients are able to view lab/test results, encounter notes, upcoming appointments, etc.  Non-urgent messages can be sent to your provider as well.   To learn more about what you can do with MyChart, go to [email or web address].    Your next appointment:   Your physician wants you to follow-up in: 1 year You will receive a reminder letter in the mail two months in advance. If you don't receive a letter, please call our office to schedule the follow-up appointment.   The format for your next appointment:   In Person  Provider:   You may see Muhammad Arida, MD or one of the following Advanced Practice Providers on  your designated Care Team:   Christopher Berge, NP Ryan Dunn, PA-C Cadence Furth, PA-C Sheri Hammock, NP    Other Instructions N/A  Important Information About Sugar       

## 2022-05-17 ENCOUNTER — Ambulatory Visit
Admission: RE | Admit: 2022-05-17 | Discharge: 2022-05-17 | Disposition: A | Discharge status: Home / Self Care | Payer: Medicare HMO | Source: Ambulatory Visit | Attending: Internal Medicine | Admitting: Internal Medicine

## 2022-05-17 DIAGNOSIS — E042 Nontoxic multinodular goiter: Secondary | ICD-10-CM | POA: Diagnosis not present

## 2022-05-17 DIAGNOSIS — R590 Localized enlarged lymph nodes: Secondary | ICD-10-CM | POA: Insufficient documentation

## 2022-05-17 DIAGNOSIS — K760 Fatty (change of) liver, not elsewhere classified: Secondary | ICD-10-CM | POA: Diagnosis not present

## 2022-05-17 DIAGNOSIS — N281 Cyst of kidney, acquired: Secondary | ICD-10-CM | POA: Diagnosis not present

## 2022-05-17 DIAGNOSIS — I771 Stricture of artery: Secondary | ICD-10-CM | POA: Diagnosis not present

## 2022-05-17 DIAGNOSIS — R200 Anesthesia of skin: Secondary | ICD-10-CM | POA: Diagnosis not present

## 2022-05-17 DIAGNOSIS — K115 Sialolithiasis: Secondary | ICD-10-CM | POA: Diagnosis not present

## 2022-05-17 DIAGNOSIS — R1084 Generalized abdominal pain: Secondary | ICD-10-CM | POA: Insufficient documentation

## 2022-05-17 DIAGNOSIS — R591 Generalized enlarged lymph nodes: Secondary | ICD-10-CM | POA: Diagnosis not present

## 2022-05-17 DIAGNOSIS — M47812 Spondylosis without myelopathy or radiculopathy, cervical region: Secondary | ICD-10-CM | POA: Diagnosis not present

## 2022-05-17 LAB — POCT I-STAT CREATININE: Creatinine, Ser: 1 mg/dL (ref 0.61–1.24)

## 2022-05-17 MED ORDER — IOHEXOL 300 MG/ML  SOLN
100.0000 mL | Freq: Once | INTRAMUSCULAR | Status: AC | PRN
  Administered 2022-05-17: 100 mL via INTRAVENOUS

## 2022-05-19 ENCOUNTER — Encounter: Payer: Self-pay | Admitting: Internal Medicine

## 2022-05-19 DIAGNOSIS — K760 Fatty (change of) liver, not elsewhere classified: Secondary | ICD-10-CM | POA: Insufficient documentation

## 2022-05-19 DIAGNOSIS — M47816 Spondylosis without myelopathy or radiculopathy, lumbar region: Secondary | ICD-10-CM | POA: Insufficient documentation

## 2022-05-19 DIAGNOSIS — I700 Atherosclerosis of aorta: Secondary | ICD-10-CM | POA: Insufficient documentation

## 2022-05-22 ENCOUNTER — Encounter: Payer: Self-pay | Admitting: Internal Medicine

## 2022-05-22 DIAGNOSIS — K115 Sialolithiasis: Secondary | ICD-10-CM | POA: Insufficient documentation

## 2022-05-22 DIAGNOSIS — J341 Cyst and mucocele of nose and nasal sinus: Secondary | ICD-10-CM | POA: Insufficient documentation

## 2022-05-22 DIAGNOSIS — M47812 Spondylosis without myelopathy or radiculopathy, cervical region: Secondary | ICD-10-CM | POA: Insufficient documentation

## 2022-05-22 DIAGNOSIS — I6529 Occlusion and stenosis of unspecified carotid artery: Secondary | ICD-10-CM | POA: Insufficient documentation

## 2022-05-22 NOTE — Addendum Note (Signed)
Addended by: Orland Mustard on: 05/22/2022 09:34 PM   Modules accepted: Orders

## 2022-05-23 DIAGNOSIS — H2513 Age-related nuclear cataract, bilateral: Secondary | ICD-10-CM | POA: Diagnosis not present

## 2022-05-26 DIAGNOSIS — D2262 Melanocytic nevi of left upper limb, including shoulder: Secondary | ICD-10-CM | POA: Diagnosis not present

## 2022-05-26 DIAGNOSIS — L821 Other seborrheic keratosis: Secondary | ICD-10-CM | POA: Diagnosis not present

## 2022-05-26 DIAGNOSIS — D225 Melanocytic nevi of trunk: Secondary | ICD-10-CM | POA: Diagnosis not present

## 2022-05-26 DIAGNOSIS — D2261 Melanocytic nevi of right upper limb, including shoulder: Secondary | ICD-10-CM | POA: Diagnosis not present

## 2022-05-26 DIAGNOSIS — D2272 Melanocytic nevi of left lower limb, including hip: Secondary | ICD-10-CM | POA: Diagnosis not present

## 2022-05-26 DIAGNOSIS — D692 Other nonthrombocytopenic purpura: Secondary | ICD-10-CM | POA: Diagnosis not present

## 2022-05-26 DIAGNOSIS — D2271 Melanocytic nevi of right lower limb, including hip: Secondary | ICD-10-CM | POA: Diagnosis not present

## 2022-06-12 DIAGNOSIS — J301 Allergic rhinitis due to pollen: Secondary | ICD-10-CM | POA: Diagnosis not present

## 2022-06-12 DIAGNOSIS — K115 Sialolithiasis: Secondary | ICD-10-CM | POA: Diagnosis not present

## 2022-07-04 DIAGNOSIS — D485 Neoplasm of uncertain behavior of skin: Secondary | ICD-10-CM | POA: Diagnosis not present

## 2022-07-04 DIAGNOSIS — L280 Lichen simplex chronicus: Secondary | ICD-10-CM | POA: Diagnosis not present

## 2022-07-19 ENCOUNTER — Telehealth: Payer: Self-pay | Admitting: Internal Medicine

## 2022-07-19 NOTE — Telephone Encounter (Signed)
Patient called to let office know that he never received his Cologuard kit. He wanted to know how to get it.

## 2022-07-24 ENCOUNTER — Other Ambulatory Visit: Payer: Self-pay

## 2022-07-24 ENCOUNTER — Telehealth: Payer: Self-pay

## 2022-07-24 DIAGNOSIS — S29012A Strain of muscle and tendon of back wall of thorax, initial encounter: Secondary | ICD-10-CM | POA: Diagnosis not present

## 2022-07-24 DIAGNOSIS — M542 Cervicalgia: Secondary | ICD-10-CM | POA: Diagnosis not present

## 2022-07-24 DIAGNOSIS — Z1212 Encounter for screening for malignant neoplasm of rectum: Secondary | ICD-10-CM

## 2022-07-24 NOTE — Telephone Encounter (Signed)
Left a detailed msg on pts phone. Stating that a new Cologuard order has been sent to [employer] and that hopefully they will receive this one and ship his kit out soon.  Sheaffer-Poudrier, [name] S5 days ago   James Carrillo Patient called to let office know that he never received his Cologuard kit. He wanted to know how to get it.      Note     I called and spoke  with [employer] to check on the status and they stated they never received the order for his kit. It appears that Dr. Olivia Mackie ordered this in August. I was advised to resend the order, I placed the order under James Quint, FNP as Dr. Olivia Mackie is no longer an active Provider here.

## 2022-07-24 NOTE — Telephone Encounter (Signed)
Left a detailed msg on pts VM stating a new order has been sent in and to give it some time for kit to be shipped.

## 2022-07-24 NOTE — Progress Notes (Signed)
Cologuard was ordered due to pt calling stating he never received his cologuard kit. It looked like Dr. Olivia Mackie ordered his exact sciences in August. I called [employer] and they stated they never received the order. I was advised to re order the cologuard.

## 2022-07-25 ENCOUNTER — Ambulatory Visit: Payer: Medicare HMO | Admitting: Internal Medicine

## 2022-08-03 DIAGNOSIS — Z1212 Encounter for screening for malignant neoplasm of rectum: Secondary | ICD-10-CM | POA: Diagnosis not present

## 2022-08-10 LAB — COLOGUARD: COLOGUARD: NEGATIVE

## 2022-09-14 DIAGNOSIS — J301 Allergic rhinitis due to pollen: Secondary | ICD-10-CM | POA: Diagnosis not present

## 2022-09-14 DIAGNOSIS — H9312 Tinnitus, left ear: Secondary | ICD-10-CM | POA: Diagnosis not present

## 2022-09-14 DIAGNOSIS — H903 Sensorineural hearing loss, bilateral: Secondary | ICD-10-CM | POA: Diagnosis not present

## 2022-09-14 DIAGNOSIS — H6123 Impacted cerumen, bilateral: Secondary | ICD-10-CM | POA: Diagnosis not present

## 2022-09-14 DIAGNOSIS — K1123 Chronic sialoadenitis: Secondary | ICD-10-CM | POA: Diagnosis not present

## 2022-09-28 NOTE — Progress Notes (Signed)
Cardiology Office Note:    Date:  09/29/2022   ID:  James Carrillo, DOB Jul 24, 1951, MRN [id number]  PCP:  Martyn Ehrich, NP   [facility] Cardiologist:  Kathlyn Sacramento, MD     Referring MD: Martyn Ehrich, NP   Chief Complaint  Patient presents with   Follow up     Seen for Dr. Fletcher Anon     History of Present Illness:    James Carrillo is a 72 y.o. male with a hx of aortic atherosclerosis, PVCs, VT, and dilated aortic root.  He wore a 7-day ZIO monitor in 2022, overall PVC burden was 44%, intermittent bundle branch block, 180 episodes of SVT.  He was then evaluated by Dr. Quentin Ore in August 2022 for symptomatic PVCs and SVT (previously on metoprolol and diltiazem but is no longer needed medications for this), ablation was offered however overall the patient did not feel that this was necessary.  Last seen in our office by Dr. Fletcher Anon on 05/11/2022, at that time he was doing well from a cardiac perspective and the plan was to follow back up in a year.  He presents today for an episode of "chest tightness and breathlessness". Tested positive for covid at the end of December, ~ 7 days later he went outside and was working in his yard picking up sticks when he had chest tightness, accompanied by breathlessness. He does have palpitations intermittently at baseline, but he did not note any during this incident. He denied radiation of pain, diaphoresis, of GI upset, and he continued to work in his yard in spite of the symptoms. Since then, he has resumed swimming twice/week and hiking once per week without further incident. He denies further incident of chest pain, dyspnea, pnd, orthopnea, n, v, dizziness, syncope, edema, weight gain, or early satiety.     Past Medical History:  Diagnosis Date   Arthritis    Atrial fibrillation ([place])    per pt intermittent since 2003    Biceps tendinitis of right shoulder    [facility] [place] 12/01/20   Claustrophobia     COVID-19    07/2020 x 5 days bad cold and 09/03/20   Dysrhythmia    Multiple thyroid nodules    Palpitations    PVC's (premature ventricular contractions)    Rotator cuff tear    s/p surgery in 2000 now has tear per [facility] Dr. Edd Fabian last steroid shot (right)    Past Surgical History:  Procedure Laterality Date   KNEE SURGERY Left    left shoulder surgery     2021 [facility] Dr. Raliegh Ip    MOLE REMOVAL  2004   Dr. Collene Schlichter   SHOULDER SURGERY Right 1999/2000   x1   TONSILLECTOMY     1956    Current Medications: Current Meds  Medication Sig   IBUPROFEN PO Take by mouth as needed.   Lysine 500 MG TABS Take 500 mg by mouth daily.    Multiple Vitamins-Minerals (MULTIVITAMIN ADULT PO) Take 1 tablet by mouth daily.    Red Yeast Rice 600 MG CAPS Take 600 mg by mouth in the morning and at bedtime.    Saw Palmetto, Serenoa repens, (SAW PALMETTO PO) Take by mouth daily at 8 pm.     Allergies:   Other and Wheat bran   Social History   Socioeconomic History   Marital status: Single    Spouse name: Not on file   Number of children:  Not on file   Years of education: Not on file   Highest education level: Not on file  Occupational History   Not on file  Tobacco Use   Smoking status: Never   Smokeless tobacco: Never  Vaping Use   Vaping Use: Never used  Substance and Sexual Activity   Alcohol use: Yes    Comment: socially   Drug use: No   Sexual activity: Yes    Comment: women   Other Topics Concern   Not on file  Social History Narrative   Masters, professor    Single has adult kids sons    Social Determinants of Health   Financial Resource Strain: Low Risk  (07/05/2021)   Overall Financial Resource Strain (CARDIA)    Difficulty of Paying Living Expenses: Not hard at all  Food Insecurity: No Food Insecurity (07/05/2021)   Hunger Vital Sign    Worried About Running Out of Food in the Last Year: Never true    [place] in the Last Year: Never true   Transportation Needs: No Transportation Needs (07/05/2021)   PRAPARE - Hydrologist (Medical): No    Lack of Transportation (Non-Medical): No  Physical Activity: Not on file  Stress: No Stress Concern Present (07/05/2021)   [place]    Feeling of Stress : Not at all  Social Connections: Unknown (07/05/2021)   Social Connection and Isolation Panel [NHANES]    Frequency of Communication with Friends and Family: More than three times a week    Frequency of Social Gatherings with Friends and Family: More than three times a week    Attends Religious Services: Not on Advertising copywriter or Organizations: Not on file    Attends Archivist Meetings: Not on file    Marital Status: Not on file     Family History: The patient's family history includes Arthritis in his father and mother; Cancer in his father; Heart disease in his father and mother; Melanoma in his father; Renal Disease in his paternal grandmother; Stroke in his mother.  ROS:   Please see the history of present illness.    All other systems reviewed and are negative.  EKGs/Labs/Other Studies Reviewed:    The following studies were reviewed today:  10/20/19 CT angio chest -  1. Essentially stable measured diameter of the proximal thoracic aorta at the level of the sinuses of Valsalva at 4.0 cm. In the mid at ascending aortic region, the measured diameter is 3.6 x 3.6 cm. No evident dissection. There is aortic atherosclerosis. Recommend annual imaging followup by CTA or MRA.  03/09/2021 ZIO XT monitor -  HR 59 - 222, average 83bpm. Intermittent bundle branch block. 180 episodes of SVT, lasting up to 4 hours 47 minutes. Patient triggered episodes correspond to SVT. PVC very frequent, 44%.  06/18/2019 echo complete -EF is 55 to 60%, aortic dilatation was noted (43 mm).  10/28/2018 Lexiscan -low risk with a fixed  anterior wall defect felt to represent artifact.    EKG:  EKG is ordered today.  The ekg ordered today demonstrates NSR, HR 69 bpm, consistent with previous EKG tracings.   Recent Labs: 01/27/2022: ALT 22; BUN 19; Hemoglobin 15.8; Platelets 169.0; Potassium 4.1; Sodium 139; TSH 1.58 05/17/2022: Creatinine, Ser 1.00  Recent Lipid Panel    Component Value Date/Time   CHOL 169 01/27/2022 0903   TRIG 179.0 (H)  01/27/2022 0903   HDL 39.70 01/27/2022 0903   CHOLHDL 4 01/27/2022 0903   VLDL 35.8 01/27/2022 0903   LDLCALC 93 01/27/2022 0903   LDLDIRECT 95.0 08/20/2019 0937     Risk Assessment/Calculations:                Physical Exam:    VS:  BP 122/82   Pulse 69   Ht '5\' 8"'$  (1.727 m)   Wt 186 lb 6 oz (84.5 kg)   BMI 28.34 kg/m     Wt Readings from Last 3 Encounters:  09/29/22 186 lb 6 oz (84.5 kg)  05/11/22 182 lb 4 oz (82.7 kg)  04/20/22 181 lb 6.4 oz (82.3 kg)     GEN:  Well nourished, well developed in no acute distress HEENT: Normal NECK: No JVD; No carotid bruits LYMPHATICS: No lymphadenopathy CARDIAC: RRR, no murmurs, rubs, gallops RESPIRATORY:  Clear to auscultation without rales, wheezing or rhonchi  ABDOMEN: Soft, non-tender, non-distended MUSCULOSKELETAL:  No edema; No deformity  SKIN: Warm and dry NEUROLOGIC:  Alert and oriented x 3 PSYCHIATRIC:  Normal affect   ASSESSMENT:    1. Precordial pain   2. SVT (supraventricular tachycardia)   3. PVC (premature ventricular contraction)   4. Dilated aortic root ([place])   5. Aortic atherosclerosis ([place])   6. Mixed hyperlipidemia    PLAN:    In order of problems listed above:  Precordial pain - 1 incident of chest pain and breathlessness following recent covid diagnosis as outlined above, no recurrence of chest pain, resumed with his regular workout as well. Will arrange CCTA, check BMET prior to CCTA.  SVT/PVCs - asymptomatic, previously evaluated by Dr. Quentin Ore, currently not on medication and symptoms are  mild.  Dilated aortic root - last imaging in 2021, will arrange CCTA for precordial pain and we can evaluate his aortic dilatation at that time. HLD/aortic atherosclerosis - he is establishing care with a new PCP and anticipates they will check at his appointment with them in ~ 2 weeks. LDL was previously elevated, ASCVD score 20.3%. Currently only takes red yeast rice. If cholesterol is elevated at this appointment, strong consideration should be given to statin therapy.   Disposition - CCTA for precordial pain and aortic root dilatation, return in 3 months.            Medication Adjustments/Labs and Tests Ordered: Current medicines are reviewed at length with the patient today.  Concerns regarding medicines are outlined above.  Orders Placed This Encounter  Procedures   CT CORONARY MORPH W/CTA COR W/SCORE W/CA W/CM &/OR WO/CM   Basic metabolic panel   EKG 96-EXBM   No orders of the defined types were placed in this encounter.   Patient Instructions  Medication Instructions:  No changes at this time.   *If you need a refill on your cardiac medications before your next appointment, please call your pharmacy*   Lab Work: BMET today over at the [facility]. Stop at registration desk to check in.   If you have labs (blood work) drawn today and your tests are completely normal, you will receive your results only by: [place] (if you have MyChart) OR A paper copy in the mail If you have any lab test that is abnormal or we need to change your treatment, we will call you to review the results.   Testing/Procedures:   Your cardiac CT is scheduled at the below location:    [facility] [address]  [address] [place], [place] [address] [phone]  Scheduled for 10/16/22 at 1:45 pm and arrival time of 1:30 pm   Please follow these instructions carefully (unless otherwise directed):  Hold all erectile dysfunction medications  at least 3 days (72 hrs) prior to test. (Ie viagra, cialis, sildenafil, tadalafil, etc) We will administer nitroglycerin during this exam.   On the Night Before the Test: Be sure to Drink plenty of water. Do not consume any caffeinated/decaffeinated beverages or chocolate 12 hours prior to your test. Do not take any antihistamines 12 hours prior to your test.   On the Day of the Test: Drink plenty of water until 1 hour prior to the test. Do not eat any food 1 hour prior to test. You may take your regular medications prior to the test.  Take metoprolol (Lopressor) 50 mg two hours prior to test.       After the Test: Drink plenty of water. After receiving IV contrast, you may experience a mild flushed feeling. This is normal. On occasion, you may experience a mild rash up to 24 hours after the test. This is not dangerous. If this occurs, you can take Benadryl 25 mg and increase your fluid intake. If you experience trouble breathing, this can be serious. If it is severe call 911 IMMEDIATELY. If it is mild, please call our office. If you take any of these medications: Glipizide/Metformin, Avandament, Glucavance, please do not take 48 hours after completing test unless otherwise instructed.  We will call to schedule your test 2-4 weeks out understanding that some insurance companies will need an authorization prior to the service being performed.   For non-scheduling related questions, please contact the cardiac imaging nurse navigator should you have any questions/concerns: Marchia Bond, Cardiac Imaging Nurse Navigator Gordy Clement, Cardiac Imaging Nurse Navigator [facility] Direct Office Dial: [phone]   For scheduling needs, including cancellations and rescheduling, please call [place], [phone].    Follow-Up: At [facility], you and your health needs are our priority.  As part of our continuing mission to provide you with exceptional  heart care, we have created designated Provider Care Teams.  These Care Teams include your primary Cardiologist (physician) and Advanced Practice Providers (APPs -  Physician Assistants and Nurse Practitioners) who all work together to provide you with the care you need, when you need it.   Your next appointment:   3 month(s)  Provider:   Kathlyn Sacramento, MD or Christell Faith, PA-C       Signed, Trudi Ida, NP  09/29/2022 11:43 AM    [place]

## 2022-09-29 ENCOUNTER — Ambulatory Visit: Payer: Medicare HMO | Attending: Physician Assistant | Admitting: Cardiology

## 2022-09-29 ENCOUNTER — Encounter: Payer: Self-pay | Admitting: Physician Assistant

## 2022-09-29 ENCOUNTER — Telehealth: Payer: Self-pay | Admitting: *Deleted

## 2022-09-29 VITALS — BP 122/82 | HR 69 | Ht 68.0 in | Wt 186.4 lb

## 2022-09-29 DIAGNOSIS — I77810 Thoracic aortic ectasia: Secondary | ICD-10-CM | POA: Diagnosis not present

## 2022-09-29 DIAGNOSIS — R072 Precordial pain: Secondary | ICD-10-CM | POA: Diagnosis not present

## 2022-09-29 DIAGNOSIS — E782 Mixed hyperlipidemia: Secondary | ICD-10-CM

## 2022-09-29 DIAGNOSIS — I493 Ventricular premature depolarization: Secondary | ICD-10-CM | POA: Diagnosis not present

## 2022-09-29 DIAGNOSIS — I471 Supraventricular tachycardia, unspecified: Secondary | ICD-10-CM | POA: Diagnosis not present

## 2022-09-29 DIAGNOSIS — I700 Atherosclerosis of aorta: Secondary | ICD-10-CM

## 2022-09-29 MED ORDER — METOPROLOL TARTRATE 50 MG PO TABS: 50.0000 mg | ORAL_TABLET | Freq: Once | ORAL | 0 refills | Status: DC

## 2022-09-29 NOTE — Addendum Note (Signed)
Addended by: Valora Corporal on: 09/29/2022 03:23 PM   Modules accepted: Orders

## 2022-09-29 NOTE — Patient Instructions (Addendum)
Medication Instructions:  No changes at this time.   *If you need a refill on your cardiac medications before your next appointment, please call your pharmacy*   Lab Work: BMET today over at the [facility]. Stop at registration desk to check in.   If you have labs (blood work) drawn today and your tests are completely normal, you will receive your results only by: [place] (if you have MyChart) OR A paper copy in the mail If you have any lab test that is abnormal or we need to change your treatment, we will call you to review the results.   Testing/Procedures:   Your cardiac CT is scheduled at the below location:    [facility] [place], [place] [address] [phone]  Scheduled for 10/16/22 at 1:45 pm and arrival time of 1:30 pm   Please follow these instructions carefully (unless otherwise directed):  Hold all erectile dysfunction medications at least 3 days (72 hrs) prior to test. (Ie viagra, cialis, sildenafil, tadalafil, etc) We will administer nitroglycerin during this exam.   On the Night Before the Test: Be sure to Drink plenty of water. Do not consume any caffeinated/decaffeinated beverages or chocolate 12 hours prior to your test. Do not take any antihistamines 12 hours prior to your test.   On the Day of the Test: Drink plenty of water until 1 hour prior to the test. Do not eat any food 1 hour prior to test. You may take your regular medications prior to the test.  Take metoprolol (Lopressor) 50 mg two hours prior to test.       After the Test: Drink plenty of water. After receiving IV contrast, you may experience a mild flushed feeling. This is normal. On occasion, you may experience a mild rash up to 24 hours after the test. This is not dangerous. If this occurs, you can take Benadryl 25 mg and increase your fluid intake. If you experience trouble breathing, this can be serious. If  it is severe call 911 IMMEDIATELY. If it is mild, please call our office. If you take any of these medications: Glipizide/Metformin, Avandament, Glucavance, please do not take 48 hours after completing test unless otherwise instructed.  We will call to schedule your test 2-4 weeks out understanding that some insurance companies will need an authorization prior to the service being performed.   For non-scheduling related questions, please contact the cardiac imaging nurse navigator should you have any questions/concerns: Marchia Bond, Cardiac Imaging Nurse Navigator Gordy Clement, Cardiac Imaging Nurse Navigator [facility] Direct Office Dial: [phone]   For scheduling needs, including cancellations and rescheduling, please call [place], [phone].    Follow-Up: At [facility], you and your health needs are our priority.  As part of our continuing mission to provide you with exceptional heart care, we have created designated Provider Care Teams.  These Care Teams include your primary Cardiologist (physician) and Advanced Practice Providers (APPs -  Physician Assistants and Nurse Practitioners) who all work together to provide you with the care you need, when you need it.   Your next appointment:   3 month(s)  Provider:   Kathlyn Sacramento, MD or Christell Faith, PA-C

## 2022-09-29 NOTE — Telephone Encounter (Signed)
Spoke with patient and reviewed that his CCTA will be done over at the [facility] and to stop at registration desk to check in. He verbalized understanding with no further questions.

## 2022-10-12 ENCOUNTER — Encounter: Payer: Medicare HMO | Admitting: Family Medicine

## 2022-10-16 ENCOUNTER — Ambulatory Visit: Payer: Medicare HMO

## 2022-10-25 ENCOUNTER — Other Ambulatory Visit
Admission: RE | Admit: 2022-10-25 | Discharge: 2022-10-25 | Disposition: A | Discharge status: Home / Self Care | Payer: Medicare HMO | Attending: Cardiology | Admitting: Cardiology

## 2022-10-25 DIAGNOSIS — L603 Nail dystrophy: Secondary | ICD-10-CM | POA: Diagnosis not present

## 2022-10-25 DIAGNOSIS — R072 Precordial pain: Secondary | ICD-10-CM | POA: Diagnosis not present

## 2022-10-25 DIAGNOSIS — I471 Supraventricular tachycardia, unspecified: Secondary | ICD-10-CM | POA: Diagnosis not present

## 2022-10-25 DIAGNOSIS — I700 Atherosclerosis of aorta: Secondary | ICD-10-CM

## 2022-10-25 DIAGNOSIS — I493 Ventricular premature depolarization: Secondary | ICD-10-CM

## 2022-10-25 DIAGNOSIS — L851 Acquired keratosis [keratoderma] palmaris et plantaris: Secondary | ICD-10-CM | POA: Diagnosis not present

## 2022-10-25 DIAGNOSIS — L600 Ingrowing nail: Secondary | ICD-10-CM | POA: Diagnosis not present

## 2022-10-25 DIAGNOSIS — I77810 Thoracic aortic ectasia: Secondary | ICD-10-CM

## 2022-10-25 DIAGNOSIS — M79671 Pain in right foot: Secondary | ICD-10-CM | POA: Diagnosis not present

## 2022-10-25 LAB — BASIC METABOLIC PANEL WITH GFR
Anion gap: 6 (ref 5–15)
BUN: 22 mg/dL (ref 8–23)
CO2: 27 mmol/L (ref 22–32)
Calcium: 8.9 mg/dL (ref 8.9–10.3)
Chloride: 104 mmol/L (ref 98–111)
Creatinine, Ser: 0.85 mg/dL (ref 0.61–1.24)
GFR, Estimated: >60 mL/min
Glucose, Bld: 101 mg/dL — ABNORMAL HIGH (ref 70–99)
Potassium: 4.1 mmol/L (ref 3.5–5.1)
Sodium: 137 mmol/L (ref 135–145)

## 2022-10-27 ENCOUNTER — Telehealth (HOSPITAL_COMMUNITY): Payer: Self-pay | Admitting: *Deleted

## 2022-10-27 NOTE — Telephone Encounter (Signed)
Attempted to call patient regarding upcoming cardiac CT appointment. °Left message on voicemail with name and callback number ° °Addysen Louth RN Navigator Cardiac Imaging °[facility] °[phone] Office °[phone] Cell ° °

## 2022-10-30 ENCOUNTER — Ambulatory Visit
Admission: RE | Admit: 2022-10-30 | Discharge: 2022-10-30 | Disposition: A | Discharge status: Home / Self Care | Payer: Medicare HMO | Source: Ambulatory Visit | Attending: Cardiology | Admitting: Cardiology

## 2022-10-30 ENCOUNTER — Other Ambulatory Visit (HOSPITAL_COMMUNITY): Payer: Self-pay | Admitting: *Deleted

## 2022-10-30 DIAGNOSIS — R072 Precordial pain: Secondary | ICD-10-CM

## 2022-10-30 MED ORDER — IOHEXOL 350 MG/ML SOLN: 100.0000 mL | Freq: Once | INTRAVENOUS | Status: DC | PRN

## 2022-11-23 ENCOUNTER — Telehealth (HOSPITAL_COMMUNITY): Payer: Self-pay | Admitting: *Deleted

## 2022-11-23 ENCOUNTER — Other Ambulatory Visit (HOSPITAL_COMMUNITY): Payer: Self-pay | Admitting: *Deleted

## 2022-11-23 MED ORDER — METOPROLOL TARTRATE 50 MG PO TABS: ORAL_TABLET | ORAL | 0 refills | Status: DC

## 2022-11-23 NOTE — Telephone Encounter (Signed)
Reaching out to patient to offer assistance regarding upcoming cardiac imaging study; pt verbalizes understanding of appt date/time, parking situation and where to check in, pre-test NPO status and medications ordered, and verified current allergies; name and call back number provided for further questions should they arise ° °Nashay Brickley RN Navigator Cardiac Imaging °[place] [facility] °[phone] office °[phone] cell ° °Patient to take 50mg metoprolol tartrate two hours prior to his cardiac CT scan. °

## 2022-11-27 ENCOUNTER — Telehealth: Payer: Self-pay | Admitting: Physician Assistant

## 2022-11-27 ENCOUNTER — Ambulatory Visit
Admission: RE | Admit: 2022-11-27 | Discharge: 2022-11-27 | Disposition: A | Discharge status: Home / Self Care | Payer: Medicare HMO | Source: Ambulatory Visit | Attending: Cardiology | Admitting: Cardiology

## 2022-11-27 DIAGNOSIS — R072 Precordial pain: Secondary | ICD-10-CM | POA: Diagnosis present

## 2022-11-27 MED ORDER — SODIUM CHLORIDE 0.9 % IV BOLUS
250.0000 mL | Freq: Once | INTRAVENOUS | Status: AC
  Administered 2022-11-27: 250 mL via INTRAVENOUS

## 2022-11-27 MED ORDER — IOHEXOL 350 MG/ML SOLN
100.0000 mL | Freq: Once | INTRAVENOUS | Status: AC | PRN
  Administered 2022-11-27: 100 mL via INTRAVENOUS

## 2022-11-27 MED ORDER — NITROGLYCERIN 0.4 MG SL SUBL
0.8000 mg | SUBLINGUAL_TABLET | Freq: Once | SUBLINGUAL | Status: AC
  Administered 2022-11-27: 0.8 mg via SUBLINGUAL

## 2022-11-27 MED ORDER — METOPROLOL TARTRATE 5 MG/5ML IV SOLN
10.0000 mg | Freq: Once | INTRAVENOUS | Status: DC
Start: 2022-11-27 — End: 2022-11-28

## 2022-11-27 MED ORDER — METOPROLOL TARTRATE 5 MG/5ML IV SOLN: 10.0000 mg | Freq: Once | INTRAVENOUS | Status: DC

## 2022-11-27 NOTE — Telephone Encounter (Signed)
Spoke with patient and reviewed that provider recommended lexiscan. He was agreeable with this test. Inquired if he checks My Chart and he does so I told him we would send instructions to mychart. Instructed him to call back on Wednesday if nobody calls to schedule this test. He verbalized understanding of our conversation with no further questions at this time.     Trudi Ida, NP  You16 hours ago (4:19 PM)    Leane Call please.

## 2022-11-27 NOTE — Progress Notes (Signed)
After nitro, patient was moved into the scanner for the test and immediately said he needed to get out. He stated he is claustrophobic and can't be in the machine that he would crawl off the table.  He was offer a washcloth to cover his eyes. He stated that nothing was going to help him. We asked if he needed anxiety medication and was told it wouldn't help and he didn't have a ride home even if he took something. BP low r/t nitro, NS bolus given. Sitting on side of CT scanner drinking [place] drink provided. BP increased. D/C vitals taken, IV D/C'd, Dr. Darlyn Chamber. Patient d/c'd home.

## 2022-11-27 NOTE — Telephone Encounter (Signed)
Pt was unable to complete CT due to claustrophobia and was wanting to know if there is an alternative test. Please advise.

## 2022-11-29 ENCOUNTER — Ambulatory Visit (INDEPENDENT_AMBULATORY_CARE_PROVIDER_SITE_OTHER): Payer: Medicare HMO | Admitting: Family Medicine

## 2022-11-29 ENCOUNTER — Encounter: Payer: Self-pay | Admitting: Family Medicine

## 2022-11-29 VITALS — BP 120/78 | HR 86 | Temp 97.8°F | Ht 68.0 in | Wt 182.6 lb

## 2022-11-29 DIAGNOSIS — E538 Deficiency of other specified B group vitamins: Secondary | ICD-10-CM

## 2022-11-29 DIAGNOSIS — I700 Atherosclerosis of aorta: Secondary | ICD-10-CM

## 2022-11-29 DIAGNOSIS — E041 Nontoxic single thyroid nodule: Secondary | ICD-10-CM

## 2022-11-29 DIAGNOSIS — R7303 Prediabetes: Secondary | ICD-10-CM | POA: Diagnosis not present

## 2022-11-29 DIAGNOSIS — R002 Palpitations: Secondary | ICD-10-CM | POA: Diagnosis not present

## 2022-11-29 DIAGNOSIS — I493 Ventricular premature depolarization: Secondary | ICD-10-CM | POA: Diagnosis not present

## 2022-11-29 DIAGNOSIS — E785 Hyperlipidemia, unspecified: Secondary | ICD-10-CM | POA: Diagnosis not present

## 2022-11-29 NOTE — Patient Instructions (Addendum)
It was a pleasure meeting you today. Thank you for allowing me to take part in your health care.  Our goals for today as we discussed include:  Recommend Pneumonia 20 vaccine  Please schedule Medicare Annual Wellness  Please schedule annual physical in June Please schedule lab appointment 1 week before office visit.  Fast for 10 hours  If you have any questions or concerns, please do not hesitate to call the office at [phone].  I look forward to our next visit and until then take care and stay safe.  Regards,   Carollee Leitz, MD   [facility]

## 2022-11-29 NOTE — Progress Notes (Signed)
SUBJECTIVE:   Chief Complaint  Patient presents with   Transitions Of Care   HPI Presents to clinic to transfer care.  No acute concerns today.  Hyperlipidemia Not interested in statin therapy.  Currently takes red yeast rice extract 600 mg twice daily.  BPH Follows with urology.  Currently takes saw palmetto.  History of HSV No recent outbreak of genital herpes.  Takes lysine 500 mg daily for prophylactic treatment.  History of thyroid nodules Has had thyroid ultrasounds in past and no indication for biopsy.   Not currently symptomatic.   PERTINENT PMH / PSH: History of SVT/PVCs History of thyroid nodules History of HSV Aortic atherosclerosis Diabetes   OBJECTIVE:  BP 120/78   Pulse 86   Temp 97.8 F (36.6 C) (Oral)   Ht 5\' 8"  (1.727 m)   Wt 182 lb 9.6 oz (82.8 kg)   SpO2 95%   BMI 27.76 kg/m    Physical Exam Constitutional:      General: He is not in acute distress.    Appearance: He is normal weight. He is not ill-appearing.  HENT:     Head: Normocephalic.  Eyes:     Conjunctiva/sclera: Conjunctivae normal.  Neck:     Thyroid: No thyromegaly or thyroid tenderness.  Cardiovascular:     Rate and Rhythm: Normal rate and regular rhythm.     Pulses: Normal pulses.  Pulmonary:     Effort: Pulmonary effort is normal.     Breath sounds: Normal breath sounds.  Abdominal:     General: Bowel sounds are normal.  Neurological:     Mental Status: He is alert. Mental status is at baseline.  Psychiatric:        Mood and Affect: Mood normal.        Behavior: Behavior normal.        Thought Content: Thought content normal.        Judgment: Judgment normal.     ASSESSMENT/PLAN:  Left thyroid nodule Assessment & Plan: Chronic. Last ultrasound 02/2021 noted nodule 1 had 29 months of stability, recommended follow-up examination in 09/2022 of thyroid ensure 5-year stability and benign etiology, nodule to has gone under cystic degeneration and no longer meets  required criteria for further follow-up. Plan to discuss with patient at next visit for repeat ultrasound thyroid for completion. Check TSH  Orders: -     TSH; Future  Hyperlipidemia, unspecified hyperlipidemia type Assessment & Plan: Chronic.  Not on statin. Declined statin therapy Check fasting lipids  Orders: -     Lipid panel; Future  Prediabetes Assessment & Plan: Check A1c  Orders: -     Hemoglobin A1c; Future -     VITAMIN D 25 Hydroxy (Vit-D Deficiency, Fractures); Future -     Comprehensive metabolic panel; Future  B12 deficiency -     CBC with Differential/Platelet; Future -     Vitamin B12; Future  Aortic atherosclerosis Assessment & Plan: Noted on CT abdomen/pelvis on 05/17/2022. Offered statin therapy, patient politely declined.  Currently on red yeast rice extract 600 mg twice daily. Check fasting lipids Has not had calcium score, could consider in future.   PVC's (premature ventricular contractions) Assessment & Plan: Chronic.  Stable.  Not currently on medication.  Had episode of precordial chest pain and Coronary CT ordered however patient was unable to tolerate.  Plan for stress test per cardiology. Follow-up with cardiology as scheduled.   Palpitations Assessment & Plan: Chronic.  Stable.  No recent episodes of  palpitations.  Had Holter in past and showed frequent PVCs and SVT.  Not currently on medication.  Followed by cardiology. Check TSH   HCM Recommend pneumonia 20 vaccine Medicare annual wellness due Tetanus up-to-date Cologuard up-to-date and negative.  Recommended repeat 12/26  PDMP reviewed  Return in about 2 months (around 01/29/2023) for annual visit with fasting labs 1 week prior.  Dana Allan, MD

## 2022-12-01 NOTE — Telephone Encounter (Signed)
Reviewed the patient's chart- he is scheduled for his Lexiscan Myoview on 12/04/22.

## 2022-12-03 ENCOUNTER — Encounter: Payer: Self-pay | Admitting: Family Medicine

## 2022-12-03 DIAGNOSIS — E538 Deficiency of other specified B group vitamins: Secondary | ICD-10-CM | POA: Insufficient documentation

## 2022-12-03 NOTE — Assessment & Plan Note (Signed)
Noted on CT abdomen/pelvis on 05/17/2022. Offered statin therapy, patient politely declined.  Currently on red yeast rice extract 600 mg twice daily. Check fasting lipids Has not had calcium score, could consider in future.

## 2022-12-03 NOTE — Assessment & Plan Note (Addendum)
Chronic. Last ultrasound 02/2021 noted nodule 1 had 29 months of stability, recommended follow-up examination in 09/2022 of thyroid ensure 5-year stability and benign etiology, nodule to has gone under cystic degeneration and no longer meets required criteria for further follow-up. Plan to discuss with patient at next visit for repeat ultrasound thyroid for completion. Check TSH

## 2022-12-03 NOTE — Assessment & Plan Note (Signed)
Chronic.  Not on statin. Declined statin therapy Check fasting lipids

## 2022-12-03 NOTE — Assessment & Plan Note (Signed)
Check A1c. 

## 2022-12-03 NOTE — Assessment & Plan Note (Addendum)
Chronic.  Stable.  Not currently on medication.  Had episode of precordial chest pain and Coronary CT ordered however patient was unable to tolerate.  Plan for stress test per cardiology. Follow-up with cardiology as scheduled.

## 2022-12-03 NOTE — Assessment & Plan Note (Signed)
Chronic.  Stable.  No recent episodes of palpitations.  Had Holter in past and showed frequent PVCs and SVT.  Not currently on medication.  Followed by cardiology. Check TSH

## 2022-12-04 ENCOUNTER — Encounter
Admission: RE | Admit: 2022-12-04 | Discharge: 2022-12-04 | Disposition: A | Discharge status: Home / Self Care | Payer: Medicare HMO | Source: Ambulatory Visit | Attending: Cardiology | Admitting: Cardiology

## 2022-12-04 DIAGNOSIS — R072 Precordial pain: Secondary | ICD-10-CM | POA: Insufficient documentation

## 2022-12-15 ENCOUNTER — Telehealth: Payer: Self-pay | Admitting: Cardiovascular Disease

## 2022-12-15 ENCOUNTER — Encounter: Admission: RE | Admit: 2022-12-15 | Payer: Medicare HMO | Source: Ambulatory Visit

## 2022-12-15 NOTE — Telephone Encounter (Signed)
Patient came by office states he was unable to complete stress test due to rotator cups on both shoulders unable to hold them up for a long time & also claustrophobic, looking for another option for this test please to assist.

## 2022-12-15 NOTE — Telephone Encounter (Signed)
Options are limited given claustrophobia, inability to raise arms overhead and with patient preference to avoid radiation.  Recommend further discussion at follow-up.

## 2022-12-15 NOTE — Telephone Encounter (Signed)
The patient was unable to complete his Lexiscan Myoview test today. He is unable to extend his arms over his head due to injured rotator cuffs, he is claustrophobic, and does not want to use radiation. A CT was originally scheduled and then the Lexiscan.   He would like to know what his options are test wise.   Appointment with Eula Listen, PA on 01/03/23.

## 2022-12-15 NOTE — Telephone Encounter (Signed)
Pt made aware and verbalized understanding.

## 2023-01-01 ENCOUNTER — Ambulatory Visit: Payer: Medicare HMO | Admitting: Physician Assistant

## 2023-01-02 NOTE — Progress Notes (Unsigned)
Cardiology Office Note    Date:  01/03/2023   ID:  James Carrillo, DOB 02-02-1951, MRN [id number]  PCP:  Dana Allan, MD  Cardiologist:  Lorine Bears, MD  Electrophysiologist:  Lanier Prude, MD   Chief Complaint: Follow-up  History of Present Illness:   James Carrillo is a 72 y.o. male with history of frequent PVCs, PSVT, aortic atherosclerosis, mildly dilated aortic root, and HLD who presents for follow-up of dyspnea.  He established care with our office in 06/2018 for evaluation of palpitations and PVCs.  Zio at that time showed a predominant rhythm of sinus with an average rate of 84 bpm.  Frequent PVCs were noted with 128,000 in three days representing a 36% burden, some in the form of ventricular bigeminy and trigeminy.  A 4-beat run of NSVT was also identified.  Echo in 07/2018 showed an EF of 55 to 60%, no regional wall motion abnormalities, mild LVH, grade 2 diastolic dysfunction, mildly dilated aortic root, normal RV systolic function and ventricular cavity size.  Frequent PVCs were noted during the study.  He was initially evaluated by EP in 07/2018 with no recommendation of indication for further therapy at that time.  Lexiscan MPI in 11/2018 showed no evidence of ischemia with a normal EF.  Echo in 05/2019 showed an EF of 55 to 60%, normal RV systolic function and ventricular cavity size, no significant valvular abnormalities, and mild dilatation of the aortic root measuring 43 mm.  Repeat outpatient cardiac monitoring in 01/2021 showed frequent PVCs with a 44% burden, 180 episodes of SVT lasting up to 4 hours and 47 minutes, and intermittent bundle branch block.  He followed up with EP in 03/2021 for frequent PVCs and SVT.  His symptoms were felt to be due to SVT.  Ablation was suggested, but patient did not proceed as he felt he was not very symptomatic.  He was most recently seen in the office in 09/2022 noting shortness of breath.  He reported a diagnosis of COVID in  07/2022.  Initially, coronary CTA was recommended, though the patient was not able to follow through with this secondary to claustrophobia.  We subsequently ordered a Lexiscan MPI (has previously tolerated this), however this too was felt to be claustrophobic with patient being unable to complete the study.  He comes in doing well and is without symptoms of angina or cardiac decompensation.  He has been without further shortness of breath since his episode a couple of weeks after developing COVID.  He remains active at baseline, exercising and swimming without cardiac limitation.  No dizziness, presyncope, or syncope.  With discontinuation of caffeine he has noted resolution of palpitations.   Labs independently reviewed: 09/2022 - potassium 4.1, BUN 22, serum creatinine 0.85 01/2022 - albumin 4.4, AST/ALT normal, TC 169, TG 179, HDL 39, LDL 93, A1c 5.7, Hgb 15.8, PLT 169, TSH normal  Past Medical History:  Diagnosis Date   Arthritis    Biceps tendinitis of right shoulder    [facility] [place] 12/01/20   Claustrophobia    COVID-19    07/2020 x 5 days bad cold and 09/03/20   Dysrhythmia    Multiple thyroid nodules    Palpitations    PVC's (premature ventricular contractions)    Rotator cuff tear    s/p surgery in 2000 now has tear per MRI orth[facility] Dr. Creola Corn last steroid shot (right)    Past Surgical History:  Procedure Laterality Date   KNEE SURGERY  Left    left shoulder surgery     2021 [facility] Dr. Kirtland Bouchard    MOLE REMOVAL  2004   Dr. Consuela Mimes   SHOULDER SURGERY Right 1999/2000   x1   TONSILLECTOMY     1956    Current Medications: Current Meds  Medication Sig   IBUPROFEN PO Take by mouth as needed.   Lysine 500 MG TABS Take 500 mg by mouth daily.    Multiple Vitamins-Minerals (MULTIVITAMIN ADULT PO) Take 1 tablet by mouth daily.    Red Yeast Rice 600 MG CAPS Take 600 mg by mouth in the morning and at bedtime.    Saw Palmetto, Serenoa repens, (SAW PALMETTO PO) Take  by mouth daily at 8 pm.    Allergies:   Other and Wheat   Social History   Socioeconomic History   Marital status: Single    Spouse name: Not on file   Number of children: Not on file   Years of education: Not on file   Highest education level: Not on file  Occupational History   Not on file  Tobacco Use   Smoking status: Never   Smokeless tobacco: Never  Vaping Use   Vaping Use: Never used  Substance and Sexual Activity   Alcohol use: Yes    Comment: socially   Drug use: No   Sexual activity: Yes    Comment: women   Other Topics Concern   Not on file  Social History Narrative   Masters, professor    Single has adult kids sons    Social Determinants of Health   Financial Resource Strain: Low Risk  (07/05/2021)   Overall Financial Resource Strain (CARDIA)    Difficulty of Paying Living Expenses: Not hard at all  Food Insecurity: No Food Insecurity (07/05/2021)   Hunger Vital Sign    Worried About Running Out of Food in the Last Year: Never true    Ran Out of Food in the Last Year: Never true  Transportation Needs: No Transportation Needs (07/05/2021)   PRAPARE - Administrator, Civil Service (Medical): No    Lack of Transportation (Non-Medical): No  Physical Activity: Not on file  Stress: No Stress Concern Present (07/05/2021)   [employer] of Occupational Health - Occupational Stress Questionnaire    Feeling of Stress : Not at all  Social Connections: Unknown (07/05/2021)   Social Connection and Isolation Panel [NHANES]    Frequency of Communication with Friends and Family: More than three times a week    Frequency of Social Gatherings with Friends and Family: More than three times a week    Attends Religious Services: Not on Marketing executive or Organizations: Not on file    Attends Banker Meetings: Not on file    Marital Status: Not on file     Family History:  The patient's family history includes Arthritis in his  father and mother; Cancer in his father; Heart disease in his father and mother; Melanoma in his father; Renal Disease in his paternal grandmother; Stroke in his mother.  ROS:   12-point review of systems is negative unless otherwise noted in the HPI.   EKGs/Labs/Other Studies Reviewed:    Studies reviewed were summarized above. The additional studies were reviewed today:  Zio patch 01/2021: HR 59 - 222, average 83bpm. Intermittent bundle branch block. 180 episodes of SVT, lasting up to 4 hours 47 minutes. Patient triggered episodes correspond to SVT.  PVC very frequent, 44%. __________  2D echo 06/18/2019: 1. Left ventricular ejection fraction, by visual estimation, is 55 to  60%. The left ventricle has normal function. There is no left ventricular  hypertrophy.   2. Left ventricular diastolic Doppler parameters are indeterminate  pattern of LV diastolic filling.   3. Global right ventricle has normal systolic function.The right  ventricular size is normal. Right vetricular wall thickness was not  assessed.   4. Left atrial size was normal.   5. Right atrial size was normal.   6. The mitral valve is normal in structure. No evidence of mitral valve  regurgitation.   7. The tricuspid valve is normal in structure. Tricuspid valve  regurgitation was not visualized by color flow Doppler.   8. The aortic valve is tricuspid Aortic valve regurgitation is trivial by  color flow Doppler.   9. The pulmonic valve was not well visualized. Pulmonic valve  regurgitation is not visualized by color flow Doppler.  10. Aortic dilatation noted.  11. There is mild dilatation of the aortic root measuring 43 mm.  12. The inferior vena cava is normal in size with greater than 50%  respiratory variability, suggesting right atrial pressure of 3 mmHg.  ___________  Eugenie Birks MPI 11/29/2018: Low risk, probably normal pharmacologic myocardial perfusion stress test. There is a moderate in size, mild in  severity, fixed anterior defect that most likely represents artifact (attenuation, misregistration, and RV insertion) and less likely scar. The left ventricular ejection fraction is normal (>70%). Attenuation correction CT shows no significant coronary artery calcification. __________  2D echo 08/07/2018: - Left ventricle: The cavity size was normal. Wall thickness was    increased in a pattern of mild LVH. Systolic function was normal.    The estimated ejection fraction was in the range of 55% to 60%.    Wall motion was normal; there were no regional wall motion    abnormalities. Features are consistent with a pseudonormal left    ventricular filling pattern, with concomitant abnormal relaxation    and increased filling pressure (grade 2 diastolic dysfunction).  - Aortic root: The aortic root was mildly dilated.  - Right ventricle: The cavity size was normal. Wall thickness was    normal. Systolic function was normal.   Impressions:   - Frequent PVC's noted during study.  __________  Luci Bank patch 06/2018: Normal sinus rhythm with an average heart rate of 84 bpm. Frequent PVCs noted with a total of 128,000 beats in 3 days (36% burden) some in the form of ventricular bigeminy and trigeminy. 4 beat run of ventricular tachycardia.   EKG:  EKG is ordered today.  The EKG ordered today demonstrates NSR, 91 bpm, first-degree AV block, nonspecific ST-T changes, consistent with prior tracing  Recent Labs: 01/27/2022: ALT 22; Hemoglobin 15.8; Platelets 169.0; TSH 1.58 10/25/2022: BUN 22; Creatinine, Ser 0.85; Potassium 4.1; Sodium 137  Recent Lipid Panel    Component Value Date/Time   CHOL 169 01/27/2022 0903   TRIG 179.0 (H) 01/27/2022 0903   HDL 39.70 01/27/2022 0903   CHOLHDL 4 01/27/2022 0903   VLDL 35.8 01/27/2022 0903   LDLCALC 93 01/27/2022 0903   LDLDIRECT 95.0 08/20/2019 0937    PHYSICAL EXAM:    VS:  BP 102/80 (BP Location: Left Arm, Patient Position: Sitting, Cuff Size:  Normal)   Pulse 91   Ht 5\' 8"  (1.727 m)   Wt 182 lb 6.4 oz (82.7 kg)   SpO2 95%   BMI 27.73 kg/m  BMI: Body mass index is 27.73 kg/m.  Physical Exam Vitals reviewed.  Constitutional:      Appearance: He is well-developed.  HENT:     Head: Normocephalic and atraumatic.  Eyes:     General:        Right eye: No discharge.        Left eye: No discharge.  Neck:     Vascular: No JVD.  Cardiovascular:     Rate and Rhythm: Normal rate and regular rhythm.     Heart sounds: Normal heart sounds, S1 normal and S2 normal. Heart sounds not distant. No midsystolic click and no opening snap. No murmur heard.    No friction rub.  Pulmonary:     Effort: Pulmonary effort is normal. No respiratory distress.     Breath sounds: Normal breath sounds. No decreased breath sounds, wheezing or rales.  Chest:     Chest wall: No tenderness.  Abdominal:     General: There is no distension.  Musculoskeletal:     Cervical back: Normal range of motion.     Right lower leg: No edema.     Left lower leg: No edema.  Skin:    General: Skin is warm and dry.     Nails: There is no clubbing.  Neurological:     Mental Status: He is alert and oriented to person, place, and time.  Psychiatric:        Speech: Speech normal.        Behavior: Behavior normal.        Thought Content: Thought content normal.        Judgment: Judgment normal.     Wt Readings from Last 3 Encounters:  01/03/23 182 lb 6.4 oz (82.7 kg)  11/29/22 182 lb 9.6 oz (82.8 kg)  09/29/22 186 lb 6 oz (84.5 kg)     ASSESSMENT & PLAN:   Dyspnea: Resolved.  Occurred in the setting of recent COVID infection.  Unable to move forward with coronary CTA or Lexiscan MPI due to claustrophobia.  Given resolution of symptoms, we will defer further ischemic testing at this time.  We will pursue echo to evaluate for new cardiomyopathy, wall motion abnormalities, valvulopathy, or evidence of volume overload.  Remains active without cardiac  limitation.  Frequent PVCs: No evidence of ventricular ectopy on EKG at last visit or today.  Has been evaluated by EP with no further intervention recommended.  PSVT: Quiescent.  Palpitations improved following discontinuation of caffeine.  Has previously declined SVT ablation.  Evaluated by EP.  Dilated aortic root: CTA in 09/2019 showed the aortic root measuring 40 mm.  Based on his age and body surface area, this appeared to be upper limit of normal.  Unable to pursue CTA given trend by echo.    Disposition: F/u with Dr. Kirke Corin or an APP in 12 months, sooner if needed.   Medication Adjustments/Labs and Tests Ordered: Current medicines are reviewed at length with the patient today.  Concerns regarding medicines are outlined above. Medication changes, Labs and Tests ordered today are summarized above and listed in the Patient Instructions accessible in Encounters.   Signed, Eula Listen, PA-C 01/03/2023 12:27 PM     [facility] [place] [address] [place], [place] [address] [phone]

## 2023-01-03 ENCOUNTER — Encounter: Payer: Self-pay | Admitting: Physician Assistant

## 2023-01-03 ENCOUNTER — Ambulatory Visit: Payer: Medicare HMO | Attending: Physician Assistant | Admitting: Physician Assistant

## 2023-01-03 VITALS — BP 102/80 | HR 91 | Ht 68.0 in | Wt 182.4 lb

## 2023-01-03 DIAGNOSIS — I493 Ventricular premature depolarization: Secondary | ICD-10-CM

## 2023-01-03 DIAGNOSIS — I471 Supraventricular tachycardia, unspecified: Secondary | ICD-10-CM | POA: Diagnosis not present

## 2023-01-03 DIAGNOSIS — R0600 Dyspnea, unspecified: Secondary | ICD-10-CM | POA: Diagnosis not present

## 2023-01-03 DIAGNOSIS — I77810 Thoracic aortic ectasia: Secondary | ICD-10-CM

## 2023-01-03 NOTE — Patient Instructions (Signed)
Medication Instructions:  Your physician recommends that you continue on your current medications as directed. Please refer to the Current Medication list given to you today.  *If you need a refill on your cardiac medications before your next appointment, please call your pharmacy*   Lab Work: -None ordered If you have labs (blood work) drawn today and your tests are completely normal, you will receive your results only by: MyChart Message (if you have MyChart) OR A paper copy in the mail If you have any lab test that is abnormal or we need to change your treatment, we will call you to review the results.   Testing/Procedures: Your physician has requested that you have an echocardiogram. Echocardiography is a painless test that uses sound waves to create images of your heart. It provides your doctor with information about the size and shape of your heart and how well your heart's chambers and valves are working. This procedure takes approximately one hour. There are no restrictions for this procedure. Please do NOT wear cologne, perfume, aftershave, or lotions (deodorant is allowed). Please arrive 15 minutes prior to your appointment time.    Follow-Up: At [facility], you and your health needs are our priority.  As part of our continuing mission to provide you with exceptional heart care, we have created designated Provider Care Teams.  These Care Teams include your primary Cardiologist (physician) and Advanced Practice Providers (APPs -  Physician Assistants and Nurse Practitioners) who all work together to provide you with the care you need, when you need it.  We recommend signing up for the patient portal called "MyChart".  Sign up information is provided on this After Visit Summary.  MyChart is used to connect with patients for Virtual Visits (Telemedicine).  Patients are able to view lab/test results, encounter notes, upcoming appointments, etc.  Non-urgent messages can be sent  to your provider as well.   To learn more about what you can do with MyChart, go to [email or web address].    Your next appointment:   1 year(s)  Provider:   Eula Listen, PA-C    Other Instructions -None

## 2023-01-31 DIAGNOSIS — J018 Other acute sinusitis: Secondary | ICD-10-CM | POA: Diagnosis not present

## 2023-01-31 DIAGNOSIS — H6983 Other specified disorders of Eustachian tube, bilateral: Secondary | ICD-10-CM | POA: Diagnosis not present

## 2023-02-06 ENCOUNTER — Other Ambulatory Visit (INDEPENDENT_AMBULATORY_CARE_PROVIDER_SITE_OTHER): Payer: Medicare HMO

## 2023-02-06 DIAGNOSIS — E041 Nontoxic single thyroid nodule: Secondary | ICD-10-CM

## 2023-02-06 DIAGNOSIS — E785 Hyperlipidemia, unspecified: Secondary | ICD-10-CM

## 2023-02-06 DIAGNOSIS — E538 Deficiency of other specified B group vitamins: Secondary | ICD-10-CM

## 2023-02-06 DIAGNOSIS — R7303 Prediabetes: Secondary | ICD-10-CM

## 2023-02-06 LAB — COMPREHENSIVE METABOLIC PANEL
ALT: 24 U/L (ref 0–53)
AST: 19 U/L (ref 0–37)
Albumin: 4.1 g/dL (ref 3.5–5.2)
Alkaline Phosphatase: 65 U/L (ref 39–117)
BUN: 19 mg/dL (ref 6–23)
CO2: 28 mEq/L (ref 19–32)
Calcium: 9 mg/dL (ref 8.4–10.5)
Chloride: 102 mEq/L (ref 96–112)
Creatinine, Ser: 0.97 mg/dL (ref 0.40–1.50)
GFR: 77.12 mL/min (ref >60.00)
Glucose, Bld: 103 mg/dL — ABNORMAL HIGH (ref 70–99)
Potassium: 4 mEq/L (ref 3.5–5.1)
Sodium: 137 mEq/L (ref 135–145)
Total Bilirubin: 0.4 mg/dL (ref 0.2–1.2)
Total Protein: 7.3 g/dL (ref 6.0–8.3)

## 2023-02-06 LAB — LIPID PANEL
Cholesterol: 158 mg/dL (ref 0–200)
HDL: 37.5 mg/dL — ABNORMAL LOW
LDL Cholesterol: 84 mg/dL (ref 0–99)
NonHDL: 120.84
Total CHOL/HDL Ratio: 4
Triglycerides: 184 mg/dL — ABNORMAL HIGH (ref 0.0–149.0)
VLDL: 36.8 mg/dL (ref 0.0–40.0)

## 2023-02-06 LAB — CBC WITH DIFFERENTIAL/PLATELET
Basophils Absolute: 0.1 10*3/uL (ref 0.0–0.1)
Basophils Relative: 0.8 % (ref 0.0–3.0)
Eosinophils Absolute: 0.3 10*3/uL (ref 0.0–0.7)
Eosinophils Relative: 3.3 % (ref 0.0–5.0)
HCT: 46.7 % (ref 39.0–52.0)
Hemoglobin: 15.4 g/dL (ref 13.0–17.0)
Lymphocytes Relative: 32.2 % (ref 12.0–46.0)
Lymphs Abs: 2.5 10*3/uL (ref 0.7–4.0)
MCHC: 32.9 g/dL (ref 30.0–36.0)
MCV: 93.2 fl (ref 78.0–100.0)
Monocytes Absolute: 0.9 10*3/uL (ref 0.1–1.0)
Monocytes Relative: 11.3 % (ref 3.0–12.0)
Neutro Abs: 4.1 10*3/uL (ref 1.4–7.7)
Neutrophils Relative %: 52.4 % (ref 43.0–77.0)
Platelets: 363 10*3/uL (ref 150.0–400.0)
RBC: 5.02 Mil/uL (ref 4.22–5.81)
RDW: 13.3 % (ref 11.5–15.5)
WBC: 7.8 10*3/uL (ref 4.0–10.5)

## 2023-02-06 LAB — HEMOGLOBIN A1C: Hgb A1c MFr Bld: 5.9 % (ref 4.6–6.5)

## 2023-02-07 LAB — VITAMIN D 25 HYDROXY (VIT D DEFICIENCY, FRACTURES): VITD: 31.67 ng/mL (ref 30.00–100.00)

## 2023-02-07 LAB — VITAMIN B12: Vitamin B-12: 578 pg/mL (ref 211–911)

## 2023-02-07 LAB — TSH: TSH: 1.47 u[IU]/mL (ref 0.35–5.50)

## 2023-02-08 ENCOUNTER — Ambulatory Visit: Payer: Medicare HMO | Attending: Physician Assistant

## 2023-02-08 ENCOUNTER — Encounter: Payer: Self-pay | Admitting: Family Medicine

## 2023-02-08 ENCOUNTER — Telehealth: Payer: Self-pay | Admitting: *Deleted

## 2023-02-08 DIAGNOSIS — I5020 Unspecified systolic (congestive) heart failure: Secondary | ICD-10-CM | POA: Diagnosis not present

## 2023-02-08 DIAGNOSIS — I77810 Thoracic aortic ectasia: Secondary | ICD-10-CM

## 2023-02-08 LAB — ECHOCARDIOGRAM COMPLETE
AR max vel: 2.86 cm2
AV Area VTI: 2.92 cm2
AV Area mean vel: 2.7 cm2
AV Mean grad: 2 mmHg
AV Peak grad: 4 mmHg
Ao pk vel: 1 m/s
Area-P 1/2: 3.31 cm2
Calc EF: 46.5 %
S' Lateral: 3.2 cm
Single Plane A2C EF: 44.6 %
Single Plane A4C EF: 48.1 %

## 2023-02-08 NOTE — Telephone Encounter (Signed)
Left voicemail to see if pt is agreeable to starting on a statin medication & if so, what pharmacy.

## 2023-02-08 NOTE — Telephone Encounter (Signed)
Pt returned Latoya CMA call.

## 2023-02-09 NOTE — Telephone Encounter (Signed)
Patient stated "aint no statin gonna happen."

## 2023-02-13 ENCOUNTER — Encounter: Payer: Self-pay | Admitting: Family Medicine

## 2023-02-13 ENCOUNTER — Ambulatory Visit (INDEPENDENT_AMBULATORY_CARE_PROVIDER_SITE_OTHER): Payer: Medicare HMO | Admitting: Family Medicine

## 2023-02-13 VITALS — BP 118/74 | HR 82 | Temp 97.6°F | Ht 68.0 in | Wt 177.0 lb

## 2023-02-13 DIAGNOSIS — I77810 Thoracic aortic ectasia: Secondary | ICD-10-CM | POA: Diagnosis not present

## 2023-02-13 DIAGNOSIS — R7303 Prediabetes: Secondary | ICD-10-CM | POA: Diagnosis not present

## 2023-02-13 DIAGNOSIS — I5189 Other ill-defined heart diseases: Secondary | ICD-10-CM | POA: Diagnosis not present

## 2023-02-13 DIAGNOSIS — E785 Hyperlipidemia, unspecified: Secondary | ICD-10-CM | POA: Diagnosis not present

## 2023-02-13 DIAGNOSIS — I700 Atherosclerosis of aorta: Secondary | ICD-10-CM

## 2023-02-13 DIAGNOSIS — K760 Fatty (change of) liver, not elsewhere classified: Secondary | ICD-10-CM | POA: Diagnosis not present

## 2023-02-13 DIAGNOSIS — E041 Nontoxic single thyroid nodule: Secondary | ICD-10-CM | POA: Diagnosis not present

## 2023-02-13 NOTE — Progress Notes (Signed)
SUBJECTIVE:   Chief Complaint  Patient presents with   Annual Exam   HPI Presents to clinic for annual physical  No acute concerns today.  Hyperlipidemia Not interested in statin therapy.  Currently takes red yeast rice extract 600 mg twice daily. Recent LDL 84, Trigs 184.   The 10-year ASCVD risk score (Arnett DK, et al., 2019) is: 20.9%   Values used to calculate the score:     Age: 72 years     Sex: Male     Is Non-Hispanic African American: No     Diabetic: No     Tobacco smoker: No     Systolic Blood Pressure: 118 mmHg     Is BP treated: No     HDL Cholesterol: 37.5 mg/dL     Total Cholesterol: 158 mg/dL  Offered Calcium score.  Had recent follow up with Cardiology.  Was unable to have Coronary CT due to claustrophobia.  Had ECHO completed with LVEF 55-60% and normal function.     PERTINENT PMH / PSH: History of SVT/PVCs History of thyroid nodules History of HSV Aortic atherosclerosis Prediabetes   OBJECTIVE:  BP 118/74   Pulse 82   Temp 97.6 F (36.4 C)   Ht 5\' 8"  (1.727 m)   Wt 177 lb (80.3 kg)   SpO2 96%   BMI 26.91 kg/m    Physical Exam Constitutional:      General: He is not in acute distress.    Appearance: He is normal weight. He is not ill-appearing.  HENT:     Head: Normocephalic.  Eyes:     Conjunctiva/sclera: Conjunctivae normal.  Neck:     Thyroid: No thyromegaly or thyroid tenderness.  Cardiovascular:     Rate and Rhythm: Normal rate and regular rhythm.     Pulses: Normal pulses.  Pulmonary:     Effort: Pulmonary effort is normal.     Breath sounds: Normal breath sounds.  Abdominal:     General: Bowel sounds are normal.  Neurological:     Mental Status: He is alert. Mental status is at baseline.  Psychiatric:        Mood and Affect: Mood normal.        Behavior: Behavior normal.        Thought Content: Thought content normal.        Judgment: Judgment normal.     ASSESSMENT/PLAN:  Hyperlipidemia, unspecified  hyperlipidemia type -     Lipid panel; Future  Aortic atherosclerosis [facility]) Assessment & Plan: Noted on CT abdomen/pelvis on 05/17/2022. The 10-year ASCVD risk score (Arnett DK, et al., 2019) is: 20.9%  Declines statin therapy  Orders: -     Lipid panel; Future  Fatty liver -     Comprehensive metabolic panel; Future  Prediabetes Assessment & Plan: Recent A1c 5.9 Recommend lowering carbohydrate intake and increase activity  Orders: -     Hemoglobin A1c; Future  Thyroid nodule -     TSH; Future  Dilated aortic root (HCC) Assessment & Plan: Noted on recent Echo. Mild dilatation of aortic root, 47mm and borderline dilation of ascending aorta,86mm.   Follows with Cardiology    Diastolic dysfunction Assessment & Plan: Noted on recent ECHO. Consistent with G1DD.       HCM Recommend pneumonia 20 vaccine Medicare annual wellness due Tetanus up-to-date Cologuard up-to-date and negative.  Recommended repeat 12/26  PDMP reviewed  Return in about 1 year (around 02/13/2024) for annual visit with fasting labs 1 week prior.  Carollee Leitz, MD

## 2023-02-13 NOTE — Patient Instructions (Addendum)
It was a pleasure meeting you today. Thank you for allowing me to take part in your health care.  Our goals for today as we discussed include:  Recommend pneumonia 20 vaccine if not had both the PCV 23 and 13 previously.  Check with your pharmacy and send copy of vaccinations  Schedule Medicare Annual Wellness Visit   Follow up in 1 year Schedule lab appointment 1 week prior to office visit   If you have any questions or concerns, please do not hesitate to call the office at [phone].  I look forward to our next visit and until then take care and stay safe.  Regards,   Dana Allan, MD   [facility]

## 2023-02-18 ENCOUNTER — Encounter: Payer: Self-pay | Admitting: Family Medicine

## 2023-02-18 DIAGNOSIS — I77810 Thoracic aortic ectasia: Secondary | ICD-10-CM | POA: Insufficient documentation

## 2023-02-18 DIAGNOSIS — I5189 Other ill-defined heart diseases: Secondary | ICD-10-CM | POA: Insufficient documentation

## 2023-02-18 NOTE — Assessment & Plan Note (Signed)
Noted on CT abdomen/pelvis on 05/17/2022. The 10-year ASCVD risk score (Arnett DK, et al., 2019) is: 20.9%  Declines statin therapy

## 2023-02-18 NOTE — Assessment & Plan Note (Signed)
Recent A1c 5.9 Recommend lowering carbohydrate intake and increase activity

## 2023-02-18 NOTE — Assessment & Plan Note (Signed)
Noted on recent ECHO. Consistent with G1DD.

## 2023-02-18 NOTE — Assessment & Plan Note (Addendum)
Noted on recent Echo. Mild dilatation of aortic root, 47mm and borderline dilation of ascending aorta,79mm.   Follows with Cardiology

## 2023-03-07 ENCOUNTER — Ambulatory Visit (INDEPENDENT_AMBULATORY_CARE_PROVIDER_SITE_OTHER): Payer: Medicare HMO | Admitting: *Deleted

## 2023-03-07 VITALS — Ht 68.0 in | Wt 171.0 lb

## 2023-03-07 DIAGNOSIS — Z0000 Encounter for general adult medical examination without abnormal findings: Secondary | ICD-10-CM

## 2023-03-07 NOTE — Progress Notes (Addendum)
Subjective:   James Carrillo is a 72 y.o. male who presents for Medicare Annual/Subsequent preventive examination.  Visit Complete: Virtual  I connected with  [name] on 03/07/23 by a audio enabled telemedicine application and verified that I am speaking with the correct person using two identifiers.  Patient Location: Home  Provider Location: Office/Clinic  I discussed the limitations of evaluation and management by telemedicine. The patient expressed understanding and agreed to proceed.   Review of Systems     Cardiac Risk Factors include: advanced age (>53men, >58 women);male gender;dyslipidemia;Other (see comment), Risk factor comments: PVC's, Bradycardia     Objective:    Today's Vitals   03/07/23 1521  Weight: 171 lb (77.6 kg)  Height: 5\' 8"  (1.727 m)   Body mass index is 26 kg/m.     03/07/2023    3:31 PM 07/05/2021    3:42 PM 06/01/2020    2:08 PM 11/13/2019    6:24 AM 10/30/2019   10:05 AM  Advanced Directives  Does Patient Have a Medical Advance Directive? Yes Yes Yes Yes Yes  Type of Estate agent of [place];Living will Healthcare Power of [place];Living will Healthcare Power of [place];Living will Healthcare Power of [place];Living will   Does patient want to make changes to medical advance directive? No - Patient declined No - Patient declined No - Patient declined No - Patient declined   Copy of Healthcare Power of Attorney in Chart? Yes - validated most recent copy scanned in chart (See row information) Yes - validated most recent copy scanned in chart (See row information) Yes - validated most recent copy scanned in chart (See row information) Yes - validated most recent copy scanned in chart (See row information)     Current Medications (verified) Outpatient Encounter Medications as of 03/07/2023  Medication Sig   IBUPROFEN PO Take by mouth as needed.   Lysine 500 MG TABS Take 500 mg by mouth daily.    Multiple  Vitamins-Minerals (MULTIVITAMIN ADULT PO) Take 1 tablet by mouth daily.    Red Yeast Rice 600 MG CAPS Take 600 mg by mouth in the morning and at bedtime.    Saw Palmetto, Serenoa repens, (SAW PALMETTO PO) Take by mouth daily at 8 pm.   No facility-administered encounter medications on file as of 03/07/2023.    Allergies (verified) Other and Wheat   History: Past Medical History:  Diagnosis Date   Arthritis    Biceps tendinitis of right shoulder    [facility] 12/01/20   Claustrophobia    COVID-19    07/2020 x 5 days bad cold and 09/03/20   Dysrhythmia    Multiple thyroid nodules    Palpitations    PVC's (premature ventricular contractions)    Rotator cuff tear    s/p surgery in 2000 now has tear per MRI orth[facility] Dr. Creola Corn last steroid shot (right)   Past Surgical History:  Procedure Laterality Date   KNEE SURGERY Left    left shoulder surgery     2021 [facility] Dr. Kirtland Bouchard    MOLE REMOVAL  2004   Dr. Consuela Mimes   SHOULDER SURGERY Right 1999/2000   x1   TONSILLECTOMY     1956   Family History  Problem Relation Age of Onset   Melanoma Father        hand and arms   Arthritis Father    Cancer Father        throat dx age 78  Heart disease Father        MI age 8 and CABG hx    Arthritis Mother    Stroke Mother        age 40   Heart disease Mother        heart valve replaced older age    Renal Disease Paternal Grandmother        [place]    Social History   Socioeconomic History   Marital status: Single    Spouse name: Not on file   Number of children: Not on file   Years of education: Not on file   Highest education level: Not on file  Occupational History   Not on file  Tobacco Use   Smoking status: Never   Smokeless tobacco: Never  Vaping Use   Vaping Use: Never used  Substance and Sexual Activity   Alcohol use: Yes    Comment: socially   Drug use: No   Sexual activity: Yes    Comment: women   Other Topics Concern   Not on file   Social History Narrative   Masters, professor    Single has adult kids sons    Social Determinants of Health   Financial Resource Strain: Low Risk  (03/07/2023)   Overall Financial Resource Strain (CARDIA)    Difficulty of Paying Living Expenses: Not hard at all  Food Insecurity: No Food Insecurity (03/07/2023)   Hunger Vital Sign    Worried About Running Out of Food in the Last Year: Never true    Ran Out of Food in the Last Year: Never true  Transportation Needs: No Transportation Needs (03/07/2023)   PRAPARE - Administrator, Civil Service (Medical): No    Lack of Transportation (Non-Medical): No  Physical Activity: Sufficiently Active (03/07/2023)   Exercise Vital Sign    Days of Exercise per Week: 5 days    Minutes of Exercise per Session: 90 min  Stress: No Stress Concern Present (03/07/2023)   [employer] of Occupational Health - Occupational Stress Questionnaire    Feeling of Stress : Not at all  Social Connections: Moderately Integrated (03/07/2023)   Social Connection and Isolation Panel [NHANES]    Frequency of Communication with Friends and Family: More than three times a week    Frequency of Social Gatherings with Friends and Family: More than three times a week    Attends Religious Services: More than 4 times per year    Active Member of [employer] or Organizations: Yes    Attends Engineer, structural: More than 4 times per year    Marital Status: Divorced    Tobacco Counseling Counseling given: Not Answered   Clinical Intake:  Pre-visit preparation completed: Yes  Pain : No/denies pain     BMI - recorded: 26 Nutritional Status: BMI 25 -29 Overweight Nutritional Risks: None Diabetes: No  How often do you need to have someone help you when you read instructions, pamphlets, or other written materials from your doctor or pharmacy?: 1 - Never  Interpreter Needed?: No  Information entered by :: R. Guneet Delpino LPN   Activities of Daily  Living    03/07/2023    3:23 PM  In your present state of health, do you have any difficulty performing the following activities:  Hearing? 0  Vision? 0  Comment readers  Difficulty concentrating or making decisions? 0  Walking or climbing stairs? 0  Dressing or bathing? 0  Doing errands, shopping? 0  Preparing Food  and eating ? N  Using the Toilet? N  In the past six months, have you accidently leaked urine? N  Do you have problems with loss of bowel control? N  Managing your Medications? N  Managing your Finances? N  Housekeeping or managing your Housekeeping? N    Patient Care Team: Dana Allan, MD as PCP - General (Family Medicine) Iran Ouch, MD as PCP - Cardiology (Cardiology) Lanier Prude, MD as PCP - Electrophysiology (Cardiology)  Indicate any recent Medical Services you may have received from other than [facility] providers in the past year (date may be approximate).     Assessment:   This is a routine wellness examination for Vikash.  Hearing/Vision screen Hearing Screening - Comments:: No issues Vision Screening - Comments:: readers  Dietary issues and exercise activities discussed:     Goals Addressed             This Visit's Progress    Patient Stated       None       Depression Screen    03/07/2023    3:27 PM 02/13/2023    8:42 AM 11/29/2022    8:56 AM 04/20/2022    8:26 AM 01/20/2022    9:47 AM 07/05/2021    3:39 PM 06/01/2020    2:24 PM  PHQ 2/9 Scores  PHQ - 2 Score 0 0 0 0 0 0 0  PHQ- 9 Score 0 0         Fall Risk    03/07/2023    3:31 PM 02/13/2023    8:42 AM 11/29/2022    8:56 AM 04/20/2022    8:26 AM 01/20/2022    9:47 AM  Fall Risk   Falls in the past year? 0 0 0 0 0  Number falls in past yr: 0 0 0 0 0  Injury with Fall? 0 0 0 0 0  Risk for fall due to : No Fall Risks No Fall Risks No Fall Risks No Fall Risks No Fall Risks  Follow up Falls prevention discussed;Falls evaluation completed Falls evaluation completed Falls  evaluation completed Falls evaluation completed Falls evaluation completed    MEDICARE RISK AT HOME:  Medicare Risk at Home - 03/07/23 1532     Any stairs in or around the home? Yes    If so, are there any without handrails? No    Home free of loose throw rugs in walkways, pet beds, electrical cords, etc? Yes    Adequate lighting in your home to reduce risk of falls? Yes    Life alert? No    Use of a cane, walker or w/c? No    Grab bars in the bathroom? Yes    Shower chair or bench in shower? Yes    Elevated toilet seat or a handicapped toilet? No               Cognitive Function:        03/07/2023    3:33 PM  6CIT Screen  What Year? 0 points  What month? 0 points  What time? 0 points  Count back from 20 0 points  Months in reverse 0 points  Repeat phrase 0 points  Total Score 0 points    Immunizations Immunization History  Administered Date(s) Administered   Fluad Quad(high Dose 65+) 05/22/2022   Influenza,inj,quad, With Preservative 06/24/2018, 05/23/2019   Influenza-Unspecified 06/18/2017, 06/02/2019, 06/02/2020, 05/27/2021   MODERNA COVID-19 SARS-COV-2 PEDS BIVALENT BOOSTER 6Y-11Y 06/27/2022   Moderna  SARS-COV2 Booster Vaccination 06/16/2020   Moderna Sars-Covid-2 Vaccination 10/07/2019, 11/04/2019, 12/06/2020   Pneumococcal Polysaccharide-23 10/25/2018   Pneumococcal-Unspecified 08/10/2017   Rsv, Bivalent, Protein Subunit Rsvpref,pf Verdis Frederickson) 09/05/2022   Tdap 12/04/2017   Unspecified SARS-COV-2 Vaccination 10/06/2019   Zoster Recombinant(Shingrix) 05/25/2014, 04/12/2020    TDAP status: Up to date  Flu Vaccine status: Up to date  Pneumococcal vaccine status: Up to date  Covid-19 vaccine status: Completed vaccines  Qualifies for Shingles Vaccine? Yes   Zostavax completed No   Shingrix Completed?: Yes  Screening Tests Health Maintenance  Topic Date Due   COVID-19 Vaccine (7 - 2023-24 season) 08/22/2022   Pneumonia Vaccine 64+ Years old (2 of  2 - PCV) 02/13/2024 (Originally 10/26/2019)   INFLUENZA VACCINE  03/29/2023   Medicare Annual Wellness (AWV)  03/06/2024   Fecal DNA (Cologuard)  08/03/2025   DTaP/Tdap/Td (2 - Td or Tdap) 12/05/2027   Hepatitis C Screening  Completed   Zoster Vaccines- Shingrix  Completed   HPV VACCINES  Aged Out    Health Maintenance  Health Maintenance Due  Topic Date Due   COVID-19 Vaccine (7 - 2023-24 season) 08/22/2022    Colorectal cancer screening: Type of screening: Cologuard. Completed 12/23. Repeat every 3 years  Lung Cancer Screening: (Low Dose CT Chest recommended if Age 38-80 years, 20 pack-year currently smoking OR have quit w/in 15years.) does not qualify.     Additional Screening:  Hepatitis C Screening: does qualify; Completed 9/17  Vision Screening: Recommended annual ophthalmology exams for early detection of glaucoma and other disorders of the eye. Is the patient up to date with their annual eye exam?  Yes  Who is the provider or what is the name of the office in which the patient attends annual eye exams? [facility] If pt is not established with a provider, would they like to be referred to a provider to establish care? No .   Dental Screening: Recommended annual dental exams for proper oral hygiene   Community Resource Referral / Chronic Care Management: CRR required this visit?  No   CCM required this visit?  No     Plan:     I have personally reviewed and noted the following in the patient's chart:   Medical and social history Use of alcohol, tobacco or illicit drugs  Current medications and supplements including opioid prescriptions. Patient is not currently taking opioid prescriptions. Functional ability and status Nutritional status Physical activity Advanced directives List of other physicians Hospitalizations, surgeries, and ER visits in previous 12 months Vitals Screenings to include cognitive, depression, and falls Referrals and  appointments  In addition, I have reviewed and discussed with patient certain preventive protocols, quality metrics, and best practice recommendations. A written personalized care plan for preventive services as well as general preventive health recommendations were provided to patient.     Sydell Axon, LPN   09/15/1476   After Visit Summary: (MyChart) Due to this being a telephonic visit, the after visit summary with patients personalized plan was offered to patient via MyChart   Nurse Notes: None   I have reviewed the above information and agree with above.   Duncan Dull, MD

## 2023-03-07 NOTE — Patient Instructions (Addendum)
[name] , Thank you for taking time to come for your Medicare Wellness Visit. I appreciate your ongoing commitment to your health goals. Please review the following plan we discussed and let me know if I can assist you in the future.   These are the goals we discussed:  Goals      Follow up with Primary Care Provider     As needed     Patient Stated     None        This is a list of the screening recommended for you and due dates:  Health Maintenance  Topic Date Due   COVID-19 Vaccine (7 - 2023-24 season) 08/22/2022   Pneumonia Vaccine (2 of 2 - PCV) 02/13/2024*   Flu Shot  03/29/2023   Medicare Annual Wellness Visit  03/06/2024   Cologuard (Stool DNA test)  08/03/2025   DTaP/Tdap/Td vaccine (2 - Td or Tdap) 12/05/2027   Hepatitis C Screening  Completed   Zoster (Shingles) Vaccine  Completed   HPV Vaccine  Aged Out  *Topic was postponed. The date shown is not the original due date.    Advanced directives: On file  Conditions/risks identified: None  Next appointment: Follow up in one year for your annual wellness visit. 03/11/24 @ 2:30  Preventive Care 65 Years and Older, Male  Preventive care refers to lifestyle choices and visits with your health care provider that can promote health and wellness. What does preventive care include? A yearly physical exam. This is also called an annual well check. Dental exams once or twice a year. Routine eye exams. Ask your health care provider how often you should have your eyes checked. Personal lifestyle choices, including: Daily care of your teeth and gums. Regular physical activity. Eating a healthy diet. Avoiding tobacco and drug use. Limiting alcohol use. Practicing safe sex. Taking low doses of aspirin every day. Taking vitamin and mineral supplements as recommended by your health care provider. What happens during an annual well check? The services and screenings done by your health care provider during your annual  well check will depend on your age, overall health, lifestyle risk factors, and family history of disease. Counseling  Your health care provider may ask you questions about your: Alcohol use. Tobacco use. Drug use. Emotional well-being. Home and relationship well-being. Sexual activity. Eating habits. History of falls. Memory and ability to understand (cognition). Work and work Astronomer. Screening  You may have the following tests or measurements: Height, weight, and BMI. Blood pressure. Lipid and cholesterol levels. These may be checked every 5 years, or more frequently if you are over 84 years old. Skin check. Lung cancer screening. You may have this screening every year starting at age 74 if you have a 30-pack-year history of smoking and currently smoke or have quit within the past 15 years. Fecal occult blood test (FOBT) of the stool. You may have this test every year starting at age 52. Flexible sigmoidoscopy or colonoscopy. You may have a sigmoidoscopy every 5 years or a colonoscopy every 10 years starting at age 59. Prostate cancer screening. Recommendations will vary depending on your family history and other risks. Hepatitis C blood test. Hepatitis B blood test. Sexually transmitted disease (STD) testing. Diabetes screening. This is done by checking your blood sugar (glucose) after you have not eaten for a while (fasting). You may have this done every 1-3 years. Abdominal aortic aneurysm (AAA) screening. You may need this if you are a current or former smoker.  Osteoporosis. You may be screened starting at age 44 if you are at high risk. Talk with your health care provider about your test results, treatment options, and if necessary, the need for more tests. Vaccines  Your health care provider may recommend certain vaccines, such as: Influenza vaccine. This is recommended every year. Tetanus, diphtheria, and acellular pertussis (Tdap, Td) vaccine. You may need a Td booster  every 10 years. Zoster vaccine. You may need this after age 61. Pneumococcal 13-valent conjugate (PCV13) vaccine. One dose is recommended after age 83. Pneumococcal polysaccharide (PPSV23) vaccine. One dose is recommended after age 38. Talk to your health care provider about which screenings and vaccines you need and how often you need them. This information is not intended to replace advice given to you by your health care provider. Make sure you discuss any questions you have with your health care provider. Document Released: 09/10/2015 Document Revised: 05/03/2016 Document Reviewed: 06/15/2015 Elsevier Interactive Patient Education  2017 [employer].  Fall Prevention in the Home Falls can cause injuries. They can happen to people of all ages. There are many things you can do to make your home safe and to help prevent falls. What can I do on the outside of my home? Regularly fix the edges of walkways and driveways and fix any cracks. Remove anything that might make you trip as you walk through a door, such as a raised step or threshold. Trim any bushes or trees on the path to your home. Use bright outdoor lighting. Clear any walking paths of anything that might make someone trip, such as rocks or tools. Regularly check to see if handrails are loose or broken. Make sure that both sides of any steps have handrails. Any raised decks and porches should have guardrails on the edges. Have any leaves, snow, or ice cleared regularly. Use sand or salt on walking paths during winter. Clean up any spills in your garage right away. This includes oil or grease spills. What can I do in the bathroom? Use night lights. Install grab bars by the toilet and in the tub and shower. Do not use towel bars as grab bars. Use non-skid mats or decals in the tub or shower. If you need to sit down in the shower, use a plastic, non-slip stool. Keep the floor dry. Clean up any water that spills on the floor as soon  as it happens. Remove soap buildup in the tub or shower regularly. Attach bath mats securely with double-sided non-slip rug tape. Do not have throw rugs and other things on the floor that can make you trip. What can I do in the bedroom? Use night lights. Make sure that you have a light by your bed that is easy to reach. Do not use any sheets or blankets that are too big for your bed. They should not hang down onto the floor. Have a firm chair that has side arms. You can use this for support while you get dressed. Do not have throw rugs and other things on the floor that can make you trip. What can I do in the kitchen? Clean up any spills right away. Avoid walking on wet floors. Keep items that you use a lot in easy-to-reach places. If you need to reach something above you, use a strong step stool that has a grab bar. Keep electrical cords out of the way. Do not use floor polish or wax that makes floors slippery. If you must use wax, use non-skid floor  wax. Do not have throw rugs and other things on the floor that can make you trip. What can I do with my stairs? Do not leave any items on the stairs. Make sure that there are handrails on both sides of the stairs and use them. Fix handrails that are broken or loose. Make sure that handrails are as long as the stairways. Check any carpeting to make sure that it is firmly attached to the stairs. Fix any carpet that is loose or worn. Avoid having throw rugs at the top or bottom of the stairs. If you do have throw rugs, attach them to the floor with carpet tape. Make sure that you have a light switch at the top of the stairs and the bottom of the stairs. If you do not have them, ask someone to add them for you. What else can I do to help prevent falls? Wear shoes that: Do not have high heels. Have rubber bottoms. Are comfortable and fit you well. Are closed at the toe. Do not wear sandals. If you use a stepladder: Make sure that it is fully  opened. Do not climb a closed stepladder. Make sure that both sides of the stepladder are locked into place. Ask someone to hold it for you, if possible. Clearly mark and make sure that you can see: Any grab bars or handrails. First and last steps. Where the edge of each step is. Use tools that help you move around (mobility aids) if they are needed. These include: Canes. Walkers. Scooters. Crutches. Turn on the lights when you go into a dark area. Replace any light bulbs as soon as they burn out. Set up your furniture so you have a clear path. Avoid moving your furniture around. If any of your floors are uneven, fix them. If there are any pets around you, be aware of where they are. Review your medicines with your doctor. Some medicines can make you feel dizzy. This can increase your chance of falling. Ask your doctor what other things that you can do to help prevent falls. This information is not intended to replace advice given to you by your health care provider. Make sure you discuss any questions you have with your health care provider. Document Released: 06/10/2009 Document Revised: 01/20/2016 Document Reviewed: 09/18/2014 Elsevier Interactive Patient Education  2017 [employer].

## 2023-03-30 DIAGNOSIS — M2242 Chondromalacia patellae, left knee: Secondary | ICD-10-CM | POA: Diagnosis not present

## 2023-03-30 DIAGNOSIS — M1712 Unilateral primary osteoarthritis, left knee: Secondary | ICD-10-CM | POA: Diagnosis not present

## 2023-04-25 DIAGNOSIS — M5442 Lumbago with sciatica, left side: Secondary | ICD-10-CM | POA: Diagnosis not present

## 2023-04-25 DIAGNOSIS — M545 Low back pain, unspecified: Secondary | ICD-10-CM | POA: Diagnosis not present

## 2023-05-25 DIAGNOSIS — S76312A Strain of muscle, fascia and tendon of the posterior muscle group at thigh level, left thigh, initial encounter: Secondary | ICD-10-CM | POA: Diagnosis not present

## 2023-06-11 DIAGNOSIS — D2262 Melanocytic nevi of left upper limb, including shoulder: Secondary | ICD-10-CM | POA: Diagnosis not present

## 2023-06-11 DIAGNOSIS — D225 Melanocytic nevi of trunk: Secondary | ICD-10-CM | POA: Diagnosis not present

## 2023-06-11 DIAGNOSIS — L280 Lichen simplex chronicus: Secondary | ICD-10-CM | POA: Diagnosis not present

## 2023-06-11 DIAGNOSIS — L570 Actinic keratosis: Secondary | ICD-10-CM | POA: Diagnosis not present

## 2023-06-11 DIAGNOSIS — D2271 Melanocytic nevi of right lower limb, including hip: Secondary | ICD-10-CM | POA: Diagnosis not present

## 2023-06-11 DIAGNOSIS — D2272 Melanocytic nevi of left lower limb, including hip: Secondary | ICD-10-CM | POA: Diagnosis not present

## 2023-06-11 DIAGNOSIS — D2261 Melanocytic nevi of right upper limb, including shoulder: Secondary | ICD-10-CM | POA: Diagnosis not present

## 2023-06-11 DIAGNOSIS — L821 Other seborrheic keratosis: Secondary | ICD-10-CM | POA: Diagnosis not present

## 2023-08-08 DIAGNOSIS — M180 Bilateral primary osteoarthritis of first carpometacarpal joints: Secondary | ICD-10-CM | POA: Diagnosis not present

## 2023-08-14 DIAGNOSIS — M180 Bilateral primary osteoarthritis of first carpometacarpal joints: Secondary | ICD-10-CM | POA: Diagnosis not present

## 2023-09-26 DIAGNOSIS — M1711 Unilateral primary osteoarthritis, right knee: Secondary | ICD-10-CM | POA: Diagnosis not present

## 2023-09-26 DIAGNOSIS — G5601 Carpal tunnel syndrome, right upper limb: Secondary | ICD-10-CM | POA: Diagnosis not present

## 2023-10-25 DIAGNOSIS — I7300 Raynaud's syndrome without gangrene: Secondary | ICD-10-CM | POA: Diagnosis not present

## 2023-12-03 ENCOUNTER — Other Ambulatory Visit: Payer: Self-pay | Admitting: Family Medicine

## 2023-12-03 DIAGNOSIS — F419 Anxiety disorder, unspecified: Secondary | ICD-10-CM

## 2023-12-03 NOTE — Telephone Encounter (Signed)
 Prescription request has been sent to the provider for review.

## 2023-12-13 ENCOUNTER — Encounter (INDEPENDENT_AMBULATORY_CARE_PROVIDER_SITE_OTHER): Payer: Self-pay | Admitting: Nurse Practitioner

## 2024-01-02 ENCOUNTER — Telehealth: Payer: Self-pay

## 2024-01-02 NOTE — Telephone Encounter (Signed)
 Patient cancelled his appointment via MyChart with Dr. Valli Gaw with the following comment:  "Patient comments: She has left the business no longer in practice at your location"  I received notification stating he had not read my reply via MyChart:  " Hi James Carrillo-   Thank you for using MyChart.  Just wanted to let you know that Dr. Valli Gaw will be here until the end of June, in case you would like to keep your appointment.   Thanks, Kim  I left a voicemail for patient letting him know that Dr. Sueanne Emerald will be here until the end of June if he would like to see her before she leaves.  Patient does have a TOC appointment scheduled with Dr. Casimir Cleaver.  I asked him to please let us  know if he needs us .

## 2024-01-15 ENCOUNTER — Encounter (INDEPENDENT_AMBULATORY_CARE_PROVIDER_SITE_OTHER): Payer: Self-pay

## 2024-02-06 DIAGNOSIS — D485 Neoplasm of uncertain behavior of skin: Secondary | ICD-10-CM | POA: Diagnosis not present

## 2024-02-06 DIAGNOSIS — L239 Allergic contact dermatitis, unspecified cause: Secondary | ICD-10-CM | POA: Diagnosis not present

## 2024-02-06 DIAGNOSIS — L820 Inflamed seborrheic keratosis: Secondary | ICD-10-CM | POA: Diagnosis not present

## 2024-02-06 DIAGNOSIS — D692 Other nonthrombocytopenic purpura: Secondary | ICD-10-CM | POA: Diagnosis not present

## 2024-02-11 ENCOUNTER — Ambulatory Visit: Payer: Self-pay | Admitting: Family Medicine

## 2024-02-11 ENCOUNTER — Other Ambulatory Visit (INDEPENDENT_AMBULATORY_CARE_PROVIDER_SITE_OTHER): Payer: Medicare HMO

## 2024-02-11 ENCOUNTER — Other Ambulatory Visit: Payer: Self-pay | Admitting: Family Medicine

## 2024-02-11 DIAGNOSIS — E041 Nontoxic single thyroid nodule: Secondary | ICD-10-CM

## 2024-02-11 DIAGNOSIS — E785 Hyperlipidemia, unspecified: Secondary | ICD-10-CM | POA: Diagnosis not present

## 2024-02-11 DIAGNOSIS — K760 Fatty (change of) liver, not elsewhere classified: Secondary | ICD-10-CM | POA: Diagnosis not present

## 2024-02-11 DIAGNOSIS — R7303 Prediabetes: Secondary | ICD-10-CM

## 2024-02-11 DIAGNOSIS — I700 Atherosclerosis of aorta: Secondary | ICD-10-CM | POA: Diagnosis not present

## 2024-02-11 LAB — LIPID PANEL
Cholesterol: 193 mg/dL (ref 0–200)
HDL: 40.6 mg/dL (ref >39.00)
LDL Cholesterol: 120 mg/dL — ABNORMAL HIGH (ref 0–99)
NonHDL: 152.63
Total CHOL/HDL Ratio: 5
Triglycerides: 162 mg/dL — ABNORMAL HIGH (ref 0.0–149.0)
VLDL: 32.4 mg/dL (ref 0.0–40.0)

## 2024-02-11 LAB — COMPREHENSIVE METABOLIC PANEL WITH GFR
ALT: 26 U/L (ref 0–53)
AST: 26 U/L (ref 0–37)
Albumin: 4.5 g/dL (ref 3.5–5.2)
Alkaline Phosphatase: 50 U/L (ref 39–117)
BUN: 22 mg/dL (ref 6–23)
CO2: 26 meq/L (ref 19–32)
Calcium: 9 mg/dL (ref 8.4–10.5)
Chloride: 104 meq/L (ref 96–112)
Creatinine, Ser: 0.94 mg/dL (ref 0.40–1.50)
GFR: 79.52 mL/min (ref >60.00)
Glucose, Bld: 96 mg/dL (ref 70–99)
Potassium: 4 meq/L (ref 3.5–5.1)
Sodium: 137 meq/L (ref 135–145)
Total Bilirubin: 0.6 mg/dL (ref 0.2–1.2)
Total Protein: 6.9 g/dL (ref 6.0–8.3)

## 2024-02-11 LAB — HEMOGLOBIN A1C: Hgb A1c MFr Bld: 5.7 % (ref 4.6–6.5)

## 2024-02-11 NOTE — Telephone Encounter (Signed)
 Forwarded message to provider.

## 2024-02-11 NOTE — Addendum Note (Signed)
 Addended by: Thressa Flora D on: 02/11/2024 07:53 AM   Modules accepted: Orders

## 2024-02-12 LAB — TSH: TSH: 1.49 u[IU]/mL (ref 0.450–4.500)

## 2024-02-18 ENCOUNTER — Encounter: Payer: Medicare HMO | Admitting: Family Medicine

## 2024-02-28 ENCOUNTER — Encounter: Admitting: Internal Medicine

## 2024-03-03 DIAGNOSIS — M5416 Radiculopathy, lumbar region: Secondary | ICD-10-CM | POA: Diagnosis not present

## 2024-03-03 DIAGNOSIS — M51362 Other intervertebral disc degeneration, lumbar region with discogenic back pain and lower extremity pain: Secondary | ICD-10-CM | POA: Diagnosis not present

## 2024-03-03 DIAGNOSIS — M1990 Unspecified osteoarthritis, unspecified site: Secondary | ICD-10-CM | POA: Diagnosis not present

## 2024-03-05 ENCOUNTER — Encounter: Payer: Self-pay | Admitting: Cardiovascular Disease

## 2024-03-05 ENCOUNTER — Ambulatory Visit: Attending: Cardiovascular Disease | Admitting: Cardiovascular Disease

## 2024-03-05 VITALS — BP 108/70 | HR 82 | Ht 68.0 in | Wt 177.0 lb

## 2024-03-05 DIAGNOSIS — I471 Supraventricular tachycardia, unspecified: Secondary | ICD-10-CM | POA: Diagnosis not present

## 2024-03-05 DIAGNOSIS — I493 Ventricular premature depolarization: Secondary | ICD-10-CM | POA: Diagnosis not present

## 2024-03-05 DIAGNOSIS — I77810 Thoracic aortic ectasia: Secondary | ICD-10-CM

## 2024-03-05 NOTE — Patient Instructions (Signed)

## 2024-03-05 NOTE — Progress Notes (Signed)
 Cardiology Office Note   Date:  03/05/2024   ID:  James Carrillo, DOB Feb 24, 1951, MRN [id number]  PCP:  Bernardo Fend, DO  Cardiologist:   Deatrice Cage, MD   Chief Complaint  Patient presents with   Follow-up    12 month f/u no complaints today. Meds reviewed verbally with pt.      History of Present Illness: James Carrillo is a 73 y.o. male who is here today for follow-up regarding PVCs and mildly dilated aortic root.  He is not diabetic and has no history of hypertension.  He is not a smoker and drinks only occasional beer.  He has no family history of premature coronary artery disease, sudden death or arrhythmia.  His father did have an MI at the age of 25 and his mother had valve replacement.  He was seen in the past for symptomatic PVCs with significant burden.  He was treated with metoprolol  and diltiazem  in the past but currently not requiring any medications.  Lexiscan  Myoview  in April 2020 showed no evidence of ischemia with normal ejection fraction.  He was seen by Dr. Cindie in August 2022 for PVCs and SVT.  His symptoms were felt to be due to SVT.  Ablation was suggested but patient did not proceed as he felt that he was not very symptomatic.  He has been doing very well with no chest pain, shortness of breath or palpitations.  He did have chest pain last year and cardiac CTA was suggested but could not be completed due to claustrophobia.  Lexiscan  Myoview  could not be done for the same reason.  His symptoms resolved without intervention.   Past Medical History:  Diagnosis Date   Arthritis    Biceps tendinitis of right shoulder    [facility] 12[facility]01/20   Claustrophobia    COVID-19    07/2020 x 5 days bad cold and 09/03/20   Dysrhythmia    Multiple thyroid  nodules    Palpitations    PVC's (premature ventricular contractions)    Rotator cuff tear    s/p surgery in 2000 now has tear per MRI ortho [facility] Dr. Basilia last steroid shot  (right)    Past Surgical History:  Procedure Laterality Date   KNEE SURGERY Left    left shoulder surgery     [facility]. MARLA    MOLE REMOVAL  2004   Dr. Othelia   SHOULDER SURGERY Right 1999/2000   x1   TONSILLECTOMY     1956     No current outpatient medications on file.   No current facility-administered medications for this visit.    Allergies:   Other and Wheat    Social History:  The patient  reports that he has never smoked. He has never used smokeless tobacco. He reports current alcohol use. He reports that he does not use drugs.   Family History:  The patient's family history includes Arthritis in his father and mother; Cancer in his father; Heart disease in his father and mother; Melanoma in his father; Renal Disease in his paternal grandmother; Stroke in his mother.    ROS:  Please see the history of present illness.   Otherwise, review of systems are positive for none.   All other systems are reviewed and negative.    PHYSICAL EXAM: VS:  BP 108/70 (BP Location: Left Arm, Patient Position: Sitting, Cuff Size: Normal)   Pulse 82   Ht 5' 8 (1.727 m)  Wt 177 lb (80.3 kg)   SpO2 98%   BMI 26.91 kg/m  , BMI Body mass index is 26.91 kg/m. GEN: Well nourished, well developed, in no acute distress  HEENT: normal  Neck: no JVD, carotid bruits, or masses Cardiac: RRR with  premature beats; no murmurs, rubs, or gallops,no edema  Respiratory:  clear to auscultation bilaterally, normal work of breathing GI: soft, nontender, nondistended, + BS MS: no deformity or atrophy  Skin: warm and dry, no rash Neuro:  Strength and sensation are intact Psych: euthymic mood, full affect   EKG:  EKG is ordered today. EKG showed: Sinus rhythm with 1st degree A-V block with frequent and consecutive Premature ventricular complexes and Fusion complexes Inferior infarct , age undetermined When compared with ECG of 03-Nov-2019 10:01, Fusion complexes are now Present PR  interval has increased Inferior infarct is now Present T wave amplitude has decreased in Inferior leads Nonspecific T wave abnormality, worse in Anterolateral leads    Recent Labs: 02/11/2024: ALT 26; BUN 22; Creatinine, Ser 0.94; Potassium 4.0; Sodium 137; TSH 1.490    Lipid Panel    Component Value Date/Time   CHOL 193 02/11/2024 0803   TRIG 162.0 (H) 02/11/2024 0803   HDL 40.60 02/11/2024 0803   CHOLHDL 5 02/11/2024 0803   VLDL 32.4 02/11/2024 0803   LDLCALC 120 (H) 02/11/2024 0803   LDLDIRECT 95.0 08/20/2019 0937      Wt Readings from Last 3 Encounters:  03/05/24 177 lb (80.3 kg)  03/07/23 171 lb (77.6 kg)  02/13/23 177 lb (80.3 kg)          07/12/2018   11:19 AM  PAD Screen  Previous PAD dx? No  Previous surgical procedure? No  Pain with walking? No  Feet/toe relief with dangling? No  Painful, non-healing ulcers? No  Extremities discolored? No      ASSESSMENT AND PLAN:  1.  PVCs: He does have PVCs on EKG but he is asymptomatic.  Continue observation for now.  His ejection fraction is noted to be normal most recently on echocardiogram in June 2024.  2.  Paroxysmal SVT: Again minimal symptoms related to this and the patient elected not to proceed with ablation.  3.  Mildly dilated aortic root: This measured 43 mm on echocardiogram last year.  I suspect overestimation by echo versus CT.  Given his severe claustrophobia, it might be difficult to obtain a CT scan on him.  Will hold off on testing this year and reevaluate the situation next year.  Ideally, CTA should be performed then if he is able to tolerate.   Disposition:   Follow-up in 1 year.  Signed,  Deatrice Cage, MD  03/05/2024 4:51 PM    [facility]

## 2024-03-31 NOTE — Telephone Encounter (Signed)
 Introduce and schedule appt

## 2024-04-01 ENCOUNTER — Ambulatory Visit (INDEPENDENT_AMBULATORY_CARE_PROVIDER_SITE_OTHER): Admitting: Internal Medicine

## 2024-04-01 ENCOUNTER — Other Ambulatory Visit: Payer: Self-pay

## 2024-04-01 ENCOUNTER — Encounter: Payer: Self-pay | Admitting: Internal Medicine

## 2024-04-01 VITALS — BP 120/75 | HR 94 | Temp 97.8°F | Resp 18 | Ht 68.0 in | Wt 176.2 lb

## 2024-04-01 DIAGNOSIS — F419 Anxiety disorder, unspecified: Secondary | ICD-10-CM | POA: Diagnosis not present

## 2024-04-01 DIAGNOSIS — I471 Supraventricular tachycardia, unspecified: Secondary | ICD-10-CM

## 2024-04-01 DIAGNOSIS — I700 Atherosclerosis of aorta: Secondary | ICD-10-CM

## 2024-04-01 DIAGNOSIS — R7303 Prediabetes: Secondary | ICD-10-CM

## 2024-04-01 DIAGNOSIS — E041 Nontoxic single thyroid nodule: Secondary | ICD-10-CM | POA: Diagnosis not present

## 2024-04-01 MED ORDER — ALPRAZOLAM 0.5 MG PO TABS: 0.5000 mg | ORAL_TABLET | Freq: Every day | ORAL | 0 refills | Status: DC | PRN

## 2024-04-01 NOTE — Patient Instructions (Addendum)
 It was great seeing you today!  Plan discussed at today's visit: -Thyroid  US  ordered, please call number to schedule -Please ask your pharmacy when your last pneumonia vaccine was so we can document -Medication sent to pharmacy -Consider medication below  Follow up in: 6 months  Take care and let us  know if you have any questions or concerns prior to your next visit.  Dr. Bernardo  Bempedoic Acid; Ezetimibe Tablets What is this medication? BEMPEDOIC ACID; EZETIMIBE (BEM pe DOE ik AS id; ez ET i mibe) treats high cholesterol and reduces the risk of heart attack. It works by reducing the amount of cholesterol made by your body. It also reduces the amount of cholesterol absorbed from the food you eat. This decreases the amount of bad cholesterol (such as LDL) in your blood. Changes to diet and exercise are often combined with this medication. This medicine may be used for other purposes; ask your health care provider or pharmacist if you have questions. COMMON BRAND NAME(S): NEXLIZET What should I tell my care team before I take this medication? They need to know if you have any of these conditions: Gout Kidney problems Liver disease Tendon problems An unusual or allergic reaction to bempedoic acid, ezetimibe, other medications, foods, dyes, or preservatives Pregnant or trying to become pregnant Breast-feeding How should I use this medication? Take this medication by mouth. Take it as directed on the prescription label at the same time every day. Do not cut, crush, or chew this medication. Swallow the tablets whole. You can take it with or without food. If it upsets your stomach, take it with food. Keep taking it unless your care team tells you to stop. Take this medication at least 2 hours BEFORE or at least 4 hours AFTER administration of a bile acid sequestrant. Talk to your care team about the use of this medication in children. Special care may be needed. Take this medication by  mouth. Take it as directed on the prescription label at the same time every day. Do not cut, crush or chew this medication. Swallow the tablets whole. You can take it with or without food. If it upsets your stomach, take it with food. Keep taking it unless your care team tells you to stop.Take this medication at least 2 hours BEFORE or at least 4 hours AFTER administration of a bile acid sequestrant.Talk to your care team about the use of this medication in children. Special care may be needed. Overdosage: If you think you have taken too much of this medicine contact a poison control center or emergency room at once. NOTE: This medicine is only for you. Do not share this medicine with others. What if I miss a dose? If you miss a dose, take it as soon as you can. If it is almost time for your next dose, take only that dose. Do not take double or extra doses. What may interact with this medication? Do not take this medication with any of the following: Fenofibrate Gemfibrozil This medication may also interact with the following: Antacids Cyclosporine Other medications to lower cholesterol or triglycerides Pravastatin Simvastatin This list may not describe all possible interactions. Give your health care provider a list of all the medicines, herbs, non-prescription drugs, or dietary supplements you use. Also tell them if you smoke, drink alcohol, or use illegal drugs. Some items may interact with your medicine. What should I watch for while using this medication? Visit your care team for regular checks on your progress.  Tell your care team if your symptoms do not start to get better or if they get worse. Your care team may tell you to stop taking this medication if you develop muscle problems. If your muscle problems do not go away after stopping this medication, contact your care team. Taking this medication is only part of a total heart healthy program. Ask your care team if there are other changes  you can make to improve your overall health. What side effects may I notice from receiving this medication? Side effects that you should report to your care team as soon as possible: Allergic reactions--skin rash, itching, hives, swelling of the face, lips, tongue, or throat High uric acid level--severe pain, redness, warmth, or swelling in joints, pain or trouble passing urine, pain in the lower back or sides Joint, muscle, or tendon pain, swelling, or stiffness Side effects that usually do not require medical attention (report to your care team if they continue or are bothersome): Diarrhea Fatigue Muscle spasms Stomach pain This list may not describe all possible side effects. Call your doctor for medical advice about side effects. You may report side effects to FDA at 1-800-FDA-1088. Where should I keep my medication? Keep out of the reach of children and pets. Store between 15 and 30 degrees C (59 and 86 degrees F). Keep this medication in the original container. Protect from moisture. Keep the container tightly closed. Do not throw out the packet in the container. It keeps the medication dry. Avoid exposure to extreme heat. Get rid of any unused medication after the expiration date. To get rid of medications that are no longer needed or have expired: Take the medication to a medication take-back program. Check with your pharmacy or law enforcement to find a location. If you cannot return the medication, check the label or package insert to see if the medication should be thrown out in the trash or flushed down the toilet. If you are not sure, ask your care team. If it is safe to put it in the trash, empty the medication out of the container. Mix the medication with cat litter, dirt, coffee grounds, or other unwanted substance. Seal the mixture in a bag or container. Put it in the trash. NOTE: This sheet is a summary. It may not cover all possible information. If you have questions about this  medicine, talk to your doctor, pharmacist, or health care provider.  2024 Elsevier/Gold Standard (2023-07-27 00:00:00)

## 2024-04-01 NOTE — Progress Notes (Signed)
 New Patient Office Visit  Subjective    Patient ID: James Carrillo, male    DOB: 1950-11-30  Age: 73 y.o. MRN: [id number]  CC:  Chief Complaint  Patient presents with   Establish Care    HPI James Carrillo presents to establish care.  Discussed the use of AI scribe software for clinical note transcription with the patient, who gave verbal consent to proceed.  History of Present Illness James Carrillo is a 73 year old male who presents for a routine follow-up.  He has cholesterol plaque buildup in his carotid arteries and a mildly dilated aortic root. His cholesterol levels were slightly elevated in July 2025, and he is not on cholesterol-lowering medication due to concerns about side effects.  He has supraventricular tachycardia and premature ventricular contractions. He is not on anticoagulants due to bruising concerns. He experiences bruising on his arms, which he attributes to aging and sun exposure, and takes collagen supplements.  He has thyroid  nodules, first discovered in 2019, which have been stable. His last ultrasound was in 2022.  He has prediabetes with a recent A1c of 5.7, an improvement from previous levels. He has adjusted his diet by reducing high-carbohydrate foods and focusing on protein and healthy fats.  He is up to date on most vaccinations but is unsure about his pneumonia vaccine status, last recorded in 2020. He has completed cancer screenings, including a negative Cologuard test in 2023.  He uses Xanax  as needed for dental procedures to manage anxiety.  Health Maintenance: -Blood work UTD -Cologuard negative 07/2022 -Prevnar 20 due but he thinks he may have had it at his pharmacy, will check  Outpatient Encounter Medications as of 04/01/2024  Medication Sig   ALPRAZolam  (XANAX ) 0.5 MG tablet Take 0.5 mg by mouth once. Need before dental and procedure   No facility-administered encounter medications on file as of 04/01/2024.    Past Medical  History:  Diagnosis Date   Arthritis    Biceps tendinitis of right shoulder    [facility] [place] [facility]/20   Claustrophobia    COVID-19    07/2020 x 5 days bad cold and 09/03/20   Dysrhythmia    Multiple thyroid  nodules    Palpitations    PVC's (premature ventricular contractions)    Rotator cuff tear    s/p surgery in 2000 now has tear per MRI ortho [facility] Dr. Basilia last steroid shot (right)    Past Surgical History:  Procedure Laterality Date   KNEE SURGERY Left    left shoulder surgery     [facility] MAR[facility]    MOLE REMOVAL  2004   Dr. Othelia   SHOULDER SURGERY Right 1999/2000   x1   TONSILLECTOMY     1956    Family History  Problem Relation Age of Onset   Melanoma Father        hand and arms   Arthritis Father    Cancer Father        throat dx age 18    Heart disease Father        MI age 51 and CABG hx    Arthritis Mother    Stroke Mother        age 74   Heart disease Mother        heart valve replaced older age    Renal Disease Paternal Grandmother        [place]     Social History   Socioeconomic  History   Marital status: Single    Spouse name: Not on file   Number of children: Not on file   Years of education: Not on file   Highest education level: Not on file  Occupational History   Not on file  Tobacco Use   Smoking status: Never   Smokeless tobacco: Never  Vaping Use   Vaping status: Never Used  Substance and Sexual Activity   Alcohol use: Yes    Comment: socially   Drug use: No   Sexual activity: Yes    Comment: women   Other Topics Concern   Not on file  Social History Narrative   Masters, professor    Single has adult kids sons    Social Drivers of Corporate investment banker Strain: Low Risk  (03/07/2023)   Overall Financial Resource Strain (CARDIA)    Difficulty of Paying Living Expenses: Not hard at all  Food Insecurity: No Food Insecurity (03/07/2023)   Hunger Vital Sign    Worried About Running Out of Food  in the Last Year: Never true    Ran Out of Food in the Last Year: Never true  Transportation Needs: No Transportation Needs (03/07/2023)   PRAPARE - Administrator, Civil Service (Medical): No    Lack of Transportation (Non-Medical): No  Physical Activity: Sufficiently Active (03/07/2023)   Exercise Vital Sign    Days of Exercise per Week: 5 days    Minutes of Exercise per Session: 90 min  Stress: No Stress Concern Present (03/07/2023)   [employer] of Occupational Health - Occupational Stress Questionnaire    Feeling of Stress : Not at all  Social Connections: Moderately Integrated (03/07/2023)   Social Connection and Isolation Panel    Frequency of Communication with Friends and Family: More than three times a week    Frequency of Social Gatherings with Friends and Family: More than three times a week    Attends Religious Services: More than 4 times per year    Active Member of [employer] or Organizations: Yes    Attends Banker Meetings: More than 4 times per year    Marital Status: Divorced  Intimate Partner Violence: Not At Risk (03/07/2023)   Humiliation, Afraid, Rape, and Kick questionnaire    Fear of Current or Ex-Partner: No    Emotionally Abused: No    Physically Abused: No    Sexually Abused: No    Review of Systems  All other systems reviewed and are negative.       Objective    BP 120/75 (Cuff Size: Large)   Pulse 94   Temp 97.8 F (36.6 C) (Oral)   Resp 18   Ht 5' 8 (1.727 m)   Wt 176 lb 3.2 oz (79.9 kg)   SpO2 95%   BMI 26.79 kg/m   Physical Exam Constitutional:      Appearance: Normal appearance.  HENT:     Head: Normocephalic and atraumatic.     Mouth/Throat:     Mouth: Mucous membranes are moist.     Pharynx: Oropharynx is clear.  Eyes:     Extraocular Movements: Extraocular movements intact.     Conjunctiva/sclera: Conjunctivae normal.     Pupils: Pupils are equal, round, and reactive to light.  Neck:     Comments:  No thyromegaly Cardiovascular:     Rate and Rhythm: Normal rate and regular rhythm.  Pulmonary:     Effort: Pulmonary effort is normal.  Breath sounds: Normal breath sounds.  Musculoskeletal:     Cervical back: No tenderness.  Lymphadenopathy:     Cervical: No cervical adenopathy.  Skin:    General: Skin is warm and dry.     Findings: Bruising present.  Neurological:     General: No focal deficit present.     Mental Status: He is alert. Mental status is at baseline.  Psychiatric:        Mood and Affect: Mood normal.        Behavior: Behavior normal.         Assessment & Plan:   Assessment & Plan Carotid artery/Aortic atherosclerosis Cholesterol plaque buildup in the carotid arteries without acute symptoms. - Following with Cardiology. - Not interested in being on a statin, discussed Nexlizet which he will consider, info printed for him to review.   Supraventricular tachycardia (SVT) No acute episodes of SVT reported during this visit. - Following with Cardiologist.  Hyperlipidemia Cholesterol levels slightly elevated. Declined statin therapy due to concerns about side effects. Discussed alternative medication, Nexlizet, which is not a statin and may be covered by insurance. Explained that cholesterol management helps reduce the risk of heart attack and stroke, which increases with age. - Provide information on Nexlizet for hyperlipidemia management.  Prediabetes A1c at 5.7%, improved from previous levels of 5.9% and 6.0%. - Discussed dietary changes, including reducing carbohydrate intake and incorporating protein and healthy fats to stabilize blood sugar levels. - Monitor A1c every 6 months.  Stable thyroid  nodules Two thyroid  nodules identified, stable since 2019. Last ultrasound in 2022 showed no significant changes. - Order thyroid  ultrasound to assess nodule stability.  Chronic right arm ecchymosis Chronic bruising on the right arm, likely due to age-related  capillary fragility. Not on anticoagulants. Collagen supplementation has improved symptoms.  Situational anxiety related to dental procedures Anxiety related to dental visits. Finds Xanax  helpful for managing anxiety during these situations. - Prescribe Xanax  for situational anxiety related to dental procedures.  - US  THYROID ; Future - ALPRAZolam  (XANAX ) 0.5 MG tablet; Take 1 tablet (0.5 mg total) by mouth daily as needed for anxiety. Need before dental and procedure  Dispense: 4 tablet; Refill: 0   Return in about 6 months (around 10/02/2024).   Sharyle Fischer, DO

## 2024-04-03 DIAGNOSIS — G5601 Carpal tunnel syndrome, right upper limb: Secondary | ICD-10-CM | POA: Diagnosis not present

## 2024-05-02 ENCOUNTER — Encounter: Admitting: Internal Medicine

## 2024-05-09 ENCOUNTER — Ambulatory Visit
Admission: RE | Admit: 2024-05-09 | Discharge: 2024-05-09 | Disposition: A | Discharge status: Home / Self Care | Source: Ambulatory Visit | Attending: Internal Medicine | Admitting: Internal Medicine

## 2024-05-09 DIAGNOSIS — E041 Nontoxic single thyroid nodule: Secondary | ICD-10-CM | POA: Insufficient documentation

## 2024-05-09 DIAGNOSIS — E042 Nontoxic multinodular goiter: Secondary | ICD-10-CM | POA: Diagnosis not present

## 2024-05-14 ENCOUNTER — Ambulatory Visit: Payer: Self-pay | Admitting: Internal Medicine

## 2024-07-30 ENCOUNTER — Encounter

## 2024-07-30 ENCOUNTER — Ambulatory Visit: Admitting: Family Medicine

## 2024-08-06 ENCOUNTER — Ambulatory Visit (INDEPENDENT_AMBULATORY_CARE_PROVIDER_SITE_OTHER)

## 2024-08-06 VITALS — BP 120/75 | Ht 68.0 in | Wt 170.0 lb

## 2024-08-06 DIAGNOSIS — Z0000 Encounter for general adult medical examination without abnormal findings: Secondary | ICD-10-CM | POA: Diagnosis not present

## 2024-08-06 NOTE — Patient Instructions (Signed)
 [name],  Thank you for taking the time for your Medicare Wellness Visit. I appreciate your continued commitment to your health goals. Please review the care plan we discussed, and feel free to reach out if I can assist you further.  Please note that Annual Wellness Visits do not include a physical exam. Some assessments may be limited, especially if the visit was conducted virtually. If needed, we may recommend an in-person follow-up with your provider.  Ongoing Care Seeing your primary care provider every 3 to 6 months helps us  monitor your health and provide consistent, personalized care.   Referrals If a referral was made during today's visit and you haven't received any updates within two weeks, please contact the referred provider directly to check on the status.  Recommended Screenings:  Health Maintenance  Topic Date Due   Pneumococcal Vaccine for age over 53 (2 of 2 - PCV) 10/26/2019   Medicare Annual Wellness Visit  03/06/2024   Flu Shot  03/28/2024   COVID-19 Vaccine (7 - 2025-26 season) 04/28/2024   Cologuard (Stool DNA test)  08/03/2025   DTaP/Tdap/Td vaccine (2 - Td or Tdap) 12/05/2027   Hepatitis C Screening  Completed   Zoster (Shingles) Vaccine  Completed   Meningitis B Vaccine  Aged Out       03/07/2023    3:31 PM  Advanced Directives  Does Patient Have a Medical Advance Directive? Yes  Type of Estate Agent of [place];Living will  Does patient want to make changes to medical advance directive? No - Patient declined  Copy of Healthcare Power of Attorney in Chart? Yes - validated most recent copy scanned in chart (See row information)    Vision: Annual vision screenings are recommended for early detection of glaucoma, cataracts, and diabetic retinopathy. These exams can also reveal signs of chronic conditions such as diabetes and high blood pressure.  Dental: Annual dental screenings help detect early signs of oral cancer, gum  disease, and other conditions linked to overall health, including heart disease and diabetes.  Please see the attached documents for additional preventive care recommendations.

## 2024-08-06 NOTE — Progress Notes (Signed)
 I connected with  James Carrillo on 08/06/24 by a audio enabled telemedicine application and verified that I am speaking with the correct person using two identifiers.  Patient Location: Home  Provider Location: Home Office  Persons Participating in Visit: Patient.  I discussed the limitations of evaluation and management by telemedicine. The patient expressed understanding and agreed to proceed.  Vital Signs: Because this visit was a virtual/telehealth visit, some criteria may be missing or patient reported. Any vitals not documented were not able to be obtained and vitals that have been documented are patient reported.  Because this visit was a virtual/telehealth visit,  certain criteria was not obtained, such a blood pressure, CBG if applicable, and timed get up and go. Any medications not marked as taking were not mentioned during the medication reconciliation part of the visit. Any vitals not documented were not able to be obtained due to this being a telehealth visit or patient was unable to self-report a recent blood pressure reading due to a lack of equipment at home via telehealth. Vitals that have been documented are verbally provided by the patient.   This visit was performed by a medical professional under my direct supervision. I was immediately available for consultation/collaboration. I have reviewed and agree with the Annual Wellness Visit documentation.  Chief Complaint  Patient presents with   Medicare Wellness     Subjective:   James Carrillo is a 73 y.o. male who presents for a Medicare Annual Wellness Visit.  Visit info / Clinical Intake: Medicare Wellness Visit Type:: Subsequent Annual Wellness Visit Persons participating in visit and providing information:: patient Medicare Wellness Visit Mode:: Telephone If telephone:: video declined Since this visit was completed virtually, some vitals may be partially provided or unavailable. Missing vitals are due to  the limitations of the virtual format.: Documented vitals are patient reported If Telephone or Video please confirm:: I connected with patient using audio/video enable telemedicine. I verified patient identity with two identifiers, discussed telehealth limitations, and patient agreed to proceed. Patient Location:: home Provider Location:: home office Interpreter Needed?: No Pre-visit prep was completed: yes AWV questionnaire completed by patient prior to visit?: no Living arrangements:: (!) lives alone Patient's Overall Health Status Rating: very good Typical amount of pain: some Does pain affect daily life?: no Are you currently prescribed opioids?: no  Dietary Habits and Nutritional Risks How many meals a day?: 2 Eats fruit and vegetables daily?: yes Most meals are obtained by: preparing own meals; eating out In the last 2 weeks, have you had any of the following?: none Diabetic:: no  Functional Status Activities of Daily Living (to include ambulation/medication): Independent Ambulation: Independent Medication Administration: Independent Home Management (perform basic housework or laundry): Independent Manage your own finances?: yes Primary transportation is: driving Concerns about vision?: no *vision screening is required for WTM* Concerns about hearing?: no  Fall Screening Falls in the past year?: 0 Number of falls in past year: 0 Was there an injury with Fall?: 0 Fall Risk Category Calculator: 0 Patient Fall Risk Level: Low Fall Risk  Fall Risk Patient at Risk for Falls Due to: No Fall Risks Fall risk Follow up: Falls evaluation completed; Education provided; Falls prevention discussed  Home and Transportation Safety: All rugs have non-skid backing?: yes All stairs or steps have railings?: yes Grab bars in the bathtub or shower?: yes Have non-skid surface in bathtub or shower?: yes Good home lighting?: yes Regular seat belt use?: yes Hospital stays in the last  year:: no  Cognitive Assessment Difficulty concentrating, remembering, or making decisions? : yes Will 6CIT or Mini Cog be Completed: yes What year is it?: 0 points What month is it?: 0 points Give patient an address phrase to remember (5 components): [address] [place] About what time is it?: 0 points Count backwards from 20 to 1: 0 points Say the months of the year in reverse: 0 points Repeat the address phrase from earlier: 0 points 6 CIT Score: 0 points  Advance Directives (For Healthcare) Does Patient Have a Medical Advance Directive?: Yes Does patient want to make changes to medical advance directive?: No - Patient declined Type of Advance Directive: Healthcare Power of [place]; Living will Copy of Healthcare Power of Attorney in Chart?: Yes - validated most recent copy scanned in chart (See row information) Copy of Living Will in Chart?: Yes - validated most recent copy scanned in chart (See row information)  Reviewed/Updated  Reviewed/Updated: Reviewed All (Medical, Surgical, Family, Medications, Allergies, Care Teams, Patient Goals)    Allergies (verified) Other and Wheat   Current Medications (verified) Outpatient Encounter Medications as of 08/06/2024  Medication Sig   ALPRAZolam  (XANAX ) 0.5 MG tablet Take 1 tablet (0.5 mg total) by mouth daily as needed for anxiety. Need before dental and procedure (Patient not taking: Reported on 08/06/2024)   No facility-administered encounter medications on file as of 08/06/2024.    History: Past Medical History:  Diagnosis Date   Arthritis    Biceps tendinitis of right shoulder    [facility] [place] [facility]   Claustrophobia    COVID-19    07/2020 x 5 days bad cold and 09/03/20   Dysrhythmia    Multiple thyroid  nodules    Palpitations    PVC's (premature ventricular contractions)    Rotator cuff tear    s/p surgery in 2000 now has tear per MRI ortho [facility] Dr. Basilia last steroid shot (right)   Past  Surgical History:  Procedure Laterality Date   KNEE SURGERY Left    left shoulder surgery     [facility]. MARLA    MOLE REMOVAL  2004   Dr. Othelia   SHOULDER SURGERY Right 1999/2000   x1   TONSILLECTOMY     1956   Family History  Problem Relation Age of Onset   Melanoma Father        hand and arms   Arthritis Father    Cancer Father        throat dx age 49    Heart disease Father        MI age 68 and CABG hx    Arthritis Mother    Stroke Mother        age 32   Heart disease Mother        heart valve replaced older age    Renal Disease Paternal Grandmother        [place]    Social History   Occupational History   Not on file  Tobacco Use   Smoking status: Never   Smokeless tobacco: Never  Vaping Use   Vaping status: Never Used  Substance and Sexual Activity   Alcohol use: Yes    Comment: socially   Drug use: No   Sexual activity: Yes    Comment: women    Tobacco Counseling Counseling given: Not Answered  SDOH Screenings   Food Insecurity: No Food Insecurity (08/06/2024)  Housing: Low Risk  (08/06/2024)  Transportation Needs: No  Transportation Needs (08/06/2024)  Utilities: Not At Risk (08/06/2024)  Alcohol Screen: Low Risk  (04/01/2024)  Depression (PHQ2-9): Low Risk  (08/06/2024)  Financial Resource Strain: Low Risk  (03/07/2023)  Physical Activity: Sufficiently Active (08/06/2024)  Social Connections: Moderately Integrated (08/06/2024)  Stress: No Stress Concern Present (08/06/2024)  Tobacco Use: Low Risk  (08/06/2024)  Health Literacy: Adequate Health Literacy (08/06/2024)   See flowsheets for full screening details  Depression Screen PHQ 2 & 9 Depression Scale- Over the past 2 weeks, how often have you been bothered by any of the following problems? Little interest or pleasure in doing things: 0 Feeling down, depressed, or hopeless (PHQ Adolescent also includes...irritable): 0 PHQ-2 Total Score: 0 Trouble falling or staying asleep, or  sleeping too much: 0 Feeling tired or having little energy: 0 Poor appetite or overeating (PHQ Adolescent also includes...weight loss): 0 Feeling bad about yourself - or that you are a failure or have let yourself or your family down: 0 Trouble concentrating on things, such as reading the newspaper or watching television (PHQ Adolescent also includes...like school work): 0 Moving or speaking so slowly that other people could have noticed. Or the opposite - being so fidgety or restless that you have been moving around a lot more than usual: 0 Thoughts that you would be better off dead, or of hurting yourself in some way: 0 PHQ-9 Total Score: 0 If you checked off any problems, how difficult have these problems made it for you to do your work, take care of things at home, or get along with other people?: Not difficult at all  Depression Treatment Depression Interventions/Treatment : EYV7-0 Score <4 Follow-up Not Indicated     Goals Addressed               This Visit's Progress     Patient Stated (pt-stated)        Patient would like to survive family              Objective:    Today's Vitals   08/06/24 1038  BP: 120/75  Weight: 170 lb (77.1 kg)  Height: 5' 8 (1.727 m)   Body mass index is 25.85 kg/m.  Hearing/Vision screen Hearing Screening - Comments:: No difficulties  Vision Screening - Comments:: Patient wears readers. Patient goes to [facility] in [place] Immunizations and Health Maintenance Health Maintenance  Topic Date Due   Pneumococcal Vaccine: 50+ Years (2 of 2 - PCV) 10/26/2019   Medicare Annual Wellness (AWV)  03/06/2024   Influenza Vaccine  03/28/2024   COVID-19 Vaccine (7 - 2025-26 season) 04/28/2024   Fecal DNA (Cologuard)  08/03/2025   DTaP/Tdap/Td (2 - Td or Tdap) 12/05/2027   Hepatitis C Screening  Completed   Zoster Vaccines- Shingrix  Completed   Meningococcal B Vaccine  Aged Out        Assessment/Plan:  This is a routine wellness  examination for Aul.  Patient Care Team: Bernardo Fend, DO as PCP - General (Internal Medicine) Darron Deatrice LABOR, MD as PCP - Cardiology (Cardiology) Cindie Ole DASEN, MD as PCP - Electrophysiology (Cardiology)  I have personally reviewed and noted the following in the patients chart:   Medical and social history Use of alcohol, tobacco or illicit drugs  Current medications and supplements including opioid prescriptions. Functional ability and status Nutritional status Physical activity Advanced directives List of other physicians Hospitalizations, surgeries, and ER visits in previous 12 months Vitals Screenings to include cognitive, depression, and falls Referrals and appointments  No orders of the defined types were placed in this encounter.  In addition, I have reviewed and discussed with patient certain preventive protocols, quality metrics, and best practice recommendations. A written personalized care plan for preventive services as well as general preventive health recommendations were provided to patient.   Lyle MARLA Right, [place]   08/06/2024   No follow-ups on file.  After Visit Summary: (MyChart) Due to this being a telephonic visit, the after visit summary with patients personalized plan was offered to patient via MyChart   No voiced or noted concerns at this time

## 2024-10-02 ENCOUNTER — Encounter: Payer: Self-pay | Admitting: Internal Medicine

## 2024-10-02 ENCOUNTER — Ambulatory Visit (INDEPENDENT_AMBULATORY_CARE_PROVIDER_SITE_OTHER): Admitting: Internal Medicine

## 2024-10-02 ENCOUNTER — Other Ambulatory Visit: Payer: Self-pay

## 2024-10-02 VITALS — BP 130/76 | HR 84 | Temp 97.9°F | Resp 16 | Ht 68.0 in | Wt 189.0 lb

## 2024-10-02 DIAGNOSIS — I700 Atherosclerosis of aorta: Secondary | ICD-10-CM

## 2024-10-02 DIAGNOSIS — E041 Nontoxic single thyroid nodule: Secondary | ICD-10-CM | POA: Diagnosis not present

## 2024-10-02 NOTE — Progress Notes (Signed)
 "  Established Patient Office Visit  Subjective    Patient ID: James Carrillo, male    DOB: 06-28-1951  Age: 74 y.o. MRN: [id number]  CC:  Chief Complaint  Patient presents with   Medical Management of Chronic Issues    HPI James Carrillo presents to follow up.   Discussed the use of AI scribe software for clinical note transcription with the patient, who gave verbal consent to proceed.  History of Present Illness  James Carrillo is a 74 year old male who presents for a routine follow-up visit.  He has thyroid  nodules monitored for ten years, with a recent ultrasound showing no change. He has gained about 12 pounds over six months, from 176 to 189 pounds, which he relates to decreased winter activity.  He has chronic right knee problems managed with injections about every 10 to 11 months and is followed by orthopedics. He is not ready for surgery.  He has received two pneumonia vaccines and a flu vaccine but has not had the newest pneumonia vaccine.  He currently takes Saw Palmetto 540 mg twice daily, Lysine 1000 mg, Cranberry soft gel, Red Yeast Rice 1200 mg, Creatine 5 grams, Magnesium 210 mg nightly, and a men's multivitamin.  Health Maintenance: -Blood work UTD -Cologuard negative 07/2022 -Prevnar 20 due but he thinks he may have had it at his pharmacy, will check at [facility]  Outpatient Encounter Medications as of 10/02/2024  Medication Sig   ALPRAZolam  (XANAX ) 0.5 MG tablet Take 1 tablet (0.5 mg total) by mouth daily as needed for anxiety. Need before dental and procedure (Patient not taking: Reported on 08/06/2024)   No facility-administered encounter medications on file as of 10/02/2024.    Past Medical History:  Diagnosis Date   Arthritis    Biceps tendinitis of right shoulder    [facility] [place] 12/01/20   Claustrophobia    COVID-19    07/2020 x 5 days bad cold and 09/03/20   Dysrhythmia    Multiple thyroid  nodules    Palpitations     PVC's (premature ventricular contractions)    Rotator cuff tear    s/p surgery in 2000 now has tear per MRI ortho [facility] Dr. Basilia last steroid shot (right)    Past Surgical History:  Procedure Laterality Date   KNEE SURGERY Left    left shoulder surgery     [facility] MAR[facility]    MOLE REMOVAL  2004   Dr. Othelia   SHOULDER SURGERY Right 1999/2000   x1   TONSILLECTOMY     1956    Family History  Problem Relation Age of Onset   Melanoma Father        hand and arms   Arthritis Father    Cancer Father        throat dx age 39    Heart disease Father        MI age 24 and CABG hx    Arthritis Mother    Stroke Mother        age 84   Heart disease Mother        heart valve replaced older age    Renal Disease Paternal Grandmother        [place]     Social History   Socioeconomic History   Marital status: Single    Spouse name: Not on file   Number of children: Not on file   Years of education: Not on file  Highest education level: Not on file  Occupational History   Not on file  Tobacco Use   Smoking status: Never   Smokeless tobacco: Never  Vaping Use   Vaping status: Never Used  Substance and Sexual Activity   Alcohol use: Yes    Comment: socially   Drug use: No   Sexual activity: Yes    Comment: women   Other Topics Concern   Not on file  Social History Narrative   Masters, professor    Single has adult kids sons    Social Drivers of Health   Tobacco Use: Low Risk (10/02/2024)   Patient History    Smoking Tobacco Use: Never    Smokeless Tobacco Use: Never    Passive Exposure: Not on file  Financial Resource Strain: Low Risk (03/07/2023)   Overall Financial Resource Strain (CARDIA)    Difficulty of Paying Living Expenses: Not hard at all  Food Insecurity: No Food Insecurity (08/06/2024)   Epic    Worried About Programme Researcher, Broadcasting/film/video in the Last Year: Never true    Ran Out of Food in the Last Year: Never true  Transportation Needs: No  Transportation Needs (08/06/2024)   Epic    Lack of Transportation (Medical): No    Lack of Transportation (Non-Medical): No  Physical Activity: Sufficiently Active (08/06/2024)   Exercise Vital Sign    Days of Exercise per Week: 7 days    Minutes of Exercise per Session: 70 min  Stress: No Stress Concern Present (08/06/2024)   [employer] of Occupational Health - Occupational Stress Questionnaire    Feeling of Stress: Not at all  Social Connections: Moderately Integrated (08/06/2024)   Social Connection and Isolation Panel    Frequency of Communication with Friends and Family: More than three times a week    Frequency of Social Gatherings with Friends and Family: More than three times a week    Attends Religious Services: More than 4 times per year    Active Member of Clubs or Organizations: Yes    Attends Banker Meetings: More than 4 times per year    Marital Status: Divorced  Intimate Partner Violence: Not At Risk (08/06/2024)   Epic    Fear of Current or Ex-Partner: No    Emotionally Abused: No    Physically Abused: No    Sexually Abused: No  Depression (PHQ2-9): Low Risk (10/02/2024)   Depression (PHQ2-9)    PHQ-2 Score: 0  Alcohol Screen: Low Risk (04/01/2024)   Alcohol Screen    Last Alcohol Screening Score (AUDIT): 0  Housing: Low Risk (08/06/2024)   Epic    Unable to Pay for Housing in the Last Year: No    Number of Times Moved in the Last Year: 0    Homeless in the Last Year: No  Utilities: Not At Risk (08/06/2024)   Epic    Threatened with loss of utilities: No  Health Literacy: Adequate Health Literacy (08/06/2024)   B1300 Health Literacy    Frequency of need for help with medical instructions: Never    Review of Systems  Musculoskeletal:  Positive for joint pain.  All other systems reviewed and are negative.       Objective    BP 130/76 (Cuff Size: Large)   Pulse 84   Temp 97.9 F (36.6 C) (Oral)   Resp 16   Ht 5' 8 (1.727 m)    Wt 189 lb (85.7 kg)   SpO2 99%   BMI  28.74 kg/m   Physical Exam Constitutional:      Appearance: Normal appearance.  HENT:     Head: Normocephalic and atraumatic.     Mouth/Throat:     Mouth: Mucous membranes are moist.     Pharynx: Oropharynx is clear.  Eyes:     Extraocular Movements: Extraocular movements intact.     Conjunctiva/sclera: Conjunctivae normal.     Pupils: Pupils are equal, round, and reactive to light.  Neck:     Comments: No thyromegaly Cardiovascular:     Rate and Rhythm: Normal rate and regular rhythm.  Pulmonary:     Effort: Pulmonary effort is normal.     Breath sounds: Normal breath sounds.  Musculoskeletal:     Cervical back: No tenderness.  Lymphadenopathy:     Cervical: No cervical adenopathy.  Skin:    General: Skin is warm and dry.  Neurological:     General: No focal deficit present.     Mental Status: He is alert. Mental status is at baseline.  Psychiatric:        Mood and Affect: Mood normal.        Behavior: Behavior normal.         Assessment & Plan:   Assessment & Plan  Stable thyroid  nodules Thyroid  nodules are stable with no changes on recent ultrasound. - Continue annual physical exams to monitor thyroid  nodules.  Hyperlipidemia/Aortic Atherosclerosis  Managed with red yeast rice supplementation. - Continue red yeast rice supplementation. - Plan to recheck fasting labs at follow up.   General Health Maintenance Pneumonia vaccine is due. Flu vaccine is up to date. - Continue annual flu vaccinations.  - US  THYROID ; Future - ALPRAZolam  (XANAX ) 0.5 MG tablet; Take 1 tablet (0.5 mg total) by mouth daily as needed for anxiety. Need before dental and procedure  Dispense: 4 tablet; Refill: 0   Return in about 6 months (around 04/01/2025).   Sharyle Fischer, DO  "

## 2025-03-31 ENCOUNTER — Ambulatory Visit: Admitting: Internal Medicine
